# Patient Record
Sex: Female | Born: 1970 | Race: Black or African American | Hispanic: No
Health system: Southern US, Academic
[De-identification: ages and names within clinical notes are randomized; demographics above are authoritative.]

## PROBLEM LIST (undated history)

## (undated) ENCOUNTER — Encounter

## (undated) ENCOUNTER — Encounter: Attending: Registered" | Primary: Registered"

## (undated) ENCOUNTER — Encounter
Attending: Student in an Organized Health Care Education/Training Program | Primary: Student in an Organized Health Care Education/Training Program

## (undated) ENCOUNTER — Ambulatory Visit: Payer: MEDICARE

## (undated) ENCOUNTER — Ambulatory Visit

## (undated) ENCOUNTER — Telehealth

## (undated) ENCOUNTER — Telehealth
Attending: Pharmacist Clinician (PhC)/ Clinical Pharmacy Specialist | Primary: Pharmacist Clinician (PhC)/ Clinical Pharmacy Specialist

## (undated) ENCOUNTER — Ambulatory Visit: Payer: Medicare (Managed Care)

## (undated) ENCOUNTER — Encounter: Attending: Neurology | Primary: Neurology

## (undated) ENCOUNTER — Encounter
Payer: MEDICARE | Attending: Student in an Organized Health Care Education/Training Program | Primary: Student in an Organized Health Care Education/Training Program

## (undated) ENCOUNTER — Encounter: Attending: Physician Assistant | Primary: Physician Assistant

## (undated) ENCOUNTER — Encounter
Attending: Pharmacist Clinician (PhC)/ Clinical Pharmacy Specialist | Primary: Pharmacist Clinician (PhC)/ Clinical Pharmacy Specialist

## (undated) ENCOUNTER — Telehealth
Attending: Student in an Organized Health Care Education/Training Program | Primary: Student in an Organized Health Care Education/Training Program

## (undated) ENCOUNTER — Encounter: Payer: MEDICARE | Attending: Physician Assistant | Primary: Physician Assistant

## (undated) ENCOUNTER — Encounter: Attending: Internal Medicine | Primary: Internal Medicine

## (undated) ENCOUNTER — Non-Acute Institutional Stay: Payer: MEDICARE

## (undated) ENCOUNTER — Ambulatory Visit
Payer: MEDICARE | Attending: Student in an Organized Health Care Education/Training Program | Primary: Student in an Organized Health Care Education/Training Program

## (undated) ENCOUNTER — Other Ambulatory Visit: Attending: Pharmacist | Primary: Pharmacist

## (undated) ENCOUNTER — Ambulatory Visit: Attending: Family | Primary: Family

## (undated) ENCOUNTER — Telehealth: Attending: Physician Assistant | Primary: Physician Assistant

## (undated) ENCOUNTER — Ambulatory Visit
Attending: Pharmacist Clinician (PhC)/ Clinical Pharmacy Specialist | Primary: Pharmacist Clinician (PhC)/ Clinical Pharmacy Specialist

## (undated) ENCOUNTER — Encounter: Payer: MEDICARE | Attending: Registered" | Primary: Registered"

## (undated) ENCOUNTER — Ambulatory Visit: Payer: MEDICARE | Attending: Physician Assistant | Primary: Physician Assistant

## (undated) ENCOUNTER — Ambulatory Visit: Payer: MEDICARE | Attending: Registered" | Primary: Registered"

## (undated) ENCOUNTER — Encounter: Attending: Obesity Medicine | Primary: Obesity Medicine

## (undated) ENCOUNTER — Ambulatory Visit
Payer: MEDICAID | Attending: Student in an Organized Health Care Education/Training Program | Primary: Student in an Organized Health Care Education/Training Program

## (undated) ENCOUNTER — Encounter: Payer: MEDICARE | Attending: Neurology | Primary: Neurology

## (undated) ENCOUNTER — Ambulatory Visit: Attending: Pharmacist | Primary: Pharmacist

## (undated) ENCOUNTER — Encounter: Attending: Physical Medicine & Rehabilitation | Primary: Physical Medicine & Rehabilitation

## (undated) ENCOUNTER — Ambulatory Visit: Payer: MEDICAID

## (undated) ENCOUNTER — Encounter: Attending: Pharmacist | Primary: Pharmacist

## (undated) ENCOUNTER — Telehealth: Attending: Nephrology | Primary: Nephrology

## (undated) ENCOUNTER — Encounter: Attending: Nephrology | Primary: Nephrology

## (undated) ENCOUNTER — Ambulatory Visit: Payer: MEDICARE | Attending: MOHS-Micrographic Surgery | Primary: MOHS-Micrographic Surgery

## (undated) ENCOUNTER — Encounter: Attending: Nurse Practitioner | Primary: Nurse Practitioner

## (undated) ENCOUNTER — Telehealth: Attending: Neurology | Primary: Neurology

## (undated) ENCOUNTER — Inpatient Hospital Stay

## (undated) ENCOUNTER — Encounter: Payer: MEDICARE | Attending: Obesity Medicine | Primary: Obesity Medicine

## (undated) ENCOUNTER — Ambulatory Visit: Payer: MEDICARE | Attending: Nephrology | Primary: Nephrology

## (undated) ENCOUNTER — Telehealth: Attending: "Endocrinology | Primary: "Endocrinology

## (undated) ENCOUNTER — Ambulatory Visit: Attending: Neurology | Primary: Neurology

## (undated) ENCOUNTER — Ambulatory Visit
Payer: Medicare (Managed Care) | Attending: Student in an Organized Health Care Education/Training Program | Primary: Student in an Organized Health Care Education/Training Program

## (undated) MED ORDER — AUBAGIO 14 MG TABLET: Freq: Every evening | ORAL | 0 days

---
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
## 2011-11-01 DIAGNOSIS — G4733 Obstructive sleep apnea (adult) (pediatric): Secondary | ICD-10-CM | POA: Insufficient documentation

## 2012-08-13 ENCOUNTER — Emergency Department: Payer: Self-pay | Admitting: Internal Medicine

## 2012-08-13 LAB — COMPREHENSIVE METABOLIC PANEL
Alkaline Phosphatase: 91 U/L (ref 50–136)
Chloride: 104 mmol/L (ref 98–107)
Co2: 28 mmol/L (ref 21–32)
EGFR (African American): >60
EGFR (Non-African Amer.): >60
SGOT(AST): 22 U/L (ref 15–37)
SGPT (ALT): 19 U/L (ref 12–78)

## 2012-08-13 LAB — URINALYSIS, COMPLETE
Bilirubin,UR: NEGATIVE
Nitrite: NEGATIVE
Protein: NEGATIVE
RBC,UR: 3 /HPF (ref 0–5)
WBC UR: 10 /HPF (ref 0–5)

## 2012-08-13 LAB — CBC
HCT: 38 % (ref 35.0–47.0)
MCHC: 34.2 g/dL (ref 32.0–36.0)
Platelet: 231 10*3/uL (ref 150–440)
RBC: 4.49 10*6/uL (ref 3.80–5.20)
RDW: 13.7 % (ref 11.5–14.5)
WBC: 4.8 10*3/uL (ref 3.6–11.0)

## 2012-08-13 LAB — PROTIME-INR: Prothrombin Time: 13.3 secs (ref 11.5–14.7)

## 2013-01-21 DIAGNOSIS — G35 Multiple sclerosis: Secondary | ICD-10-CM | POA: Insufficient documentation

## 2013-04-11 DIAGNOSIS — J309 Allergic rhinitis, unspecified: Secondary | ICD-10-CM | POA: Insufficient documentation

## 2013-04-11 DIAGNOSIS — R29898 Other symptoms and signs involving the musculoskeletal system: Secondary | ICD-10-CM | POA: Insufficient documentation

## 2013-04-11 DIAGNOSIS — M19079 Primary osteoarthritis, unspecified ankle and foot: Secondary | ICD-10-CM | POA: Insufficient documentation

## 2013-05-23 DIAGNOSIS — G4700 Insomnia, unspecified: Secondary | ICD-10-CM | POA: Insufficient documentation

## 2013-05-23 DIAGNOSIS — M545 Low back pain, unspecified: Secondary | ICD-10-CM | POA: Insufficient documentation

## 2013-05-24 DIAGNOSIS — D509 Iron deficiency anemia, unspecified: Secondary | ICD-10-CM | POA: Insufficient documentation

## 2013-06-12 DIAGNOSIS — L709 Acne, unspecified: Secondary | ICD-10-CM | POA: Insufficient documentation

## 2013-10-20 IMAGING — US US EXTREM LOW VENOUS*R*
1 series · 14 of 20 positions shown · non-contrast
Comparison: none

REASON FOR EXAM: pain r leg
COMMENTS:

[Series 1: us extrem low venous*right* · 0.11mm/px · 14 of 20 slices shown]
[im 1/20]
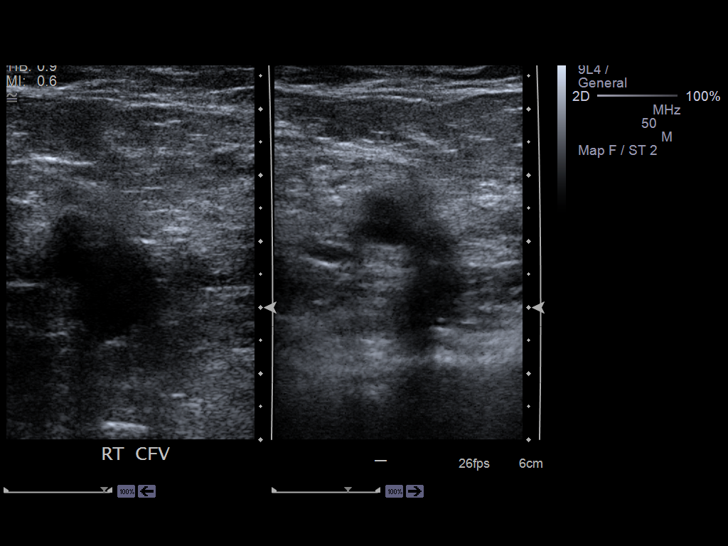
[im 3/20]
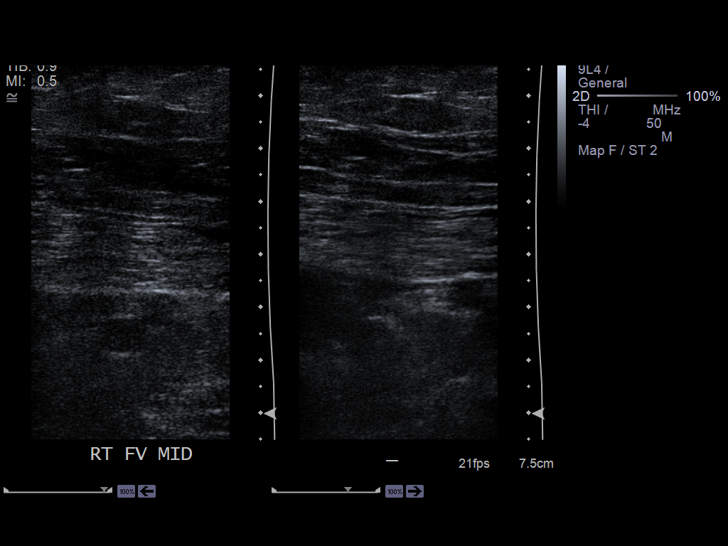
[im 4/20]
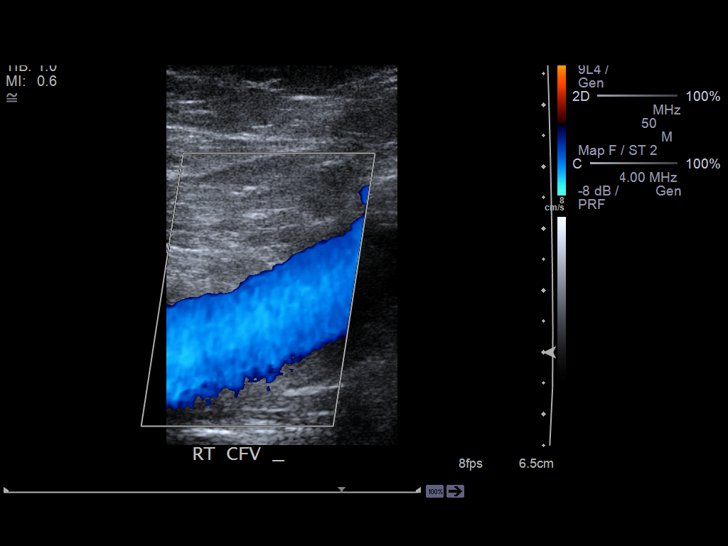
[im 6/20]
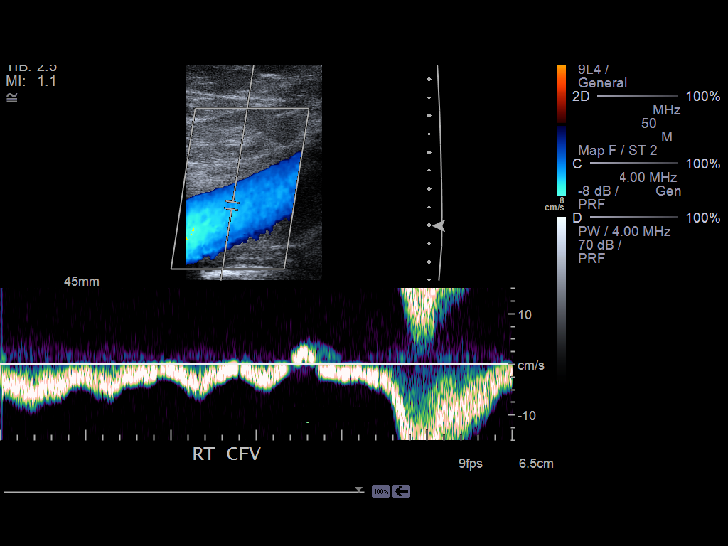
[im 7/20]
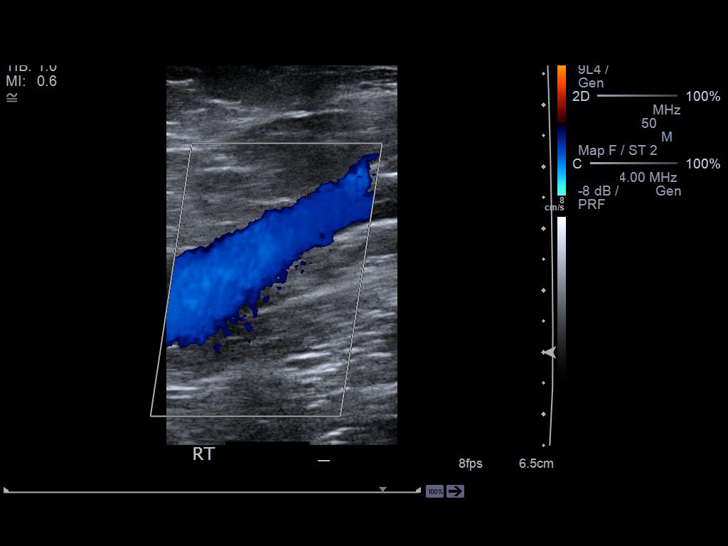
[im 8/20]
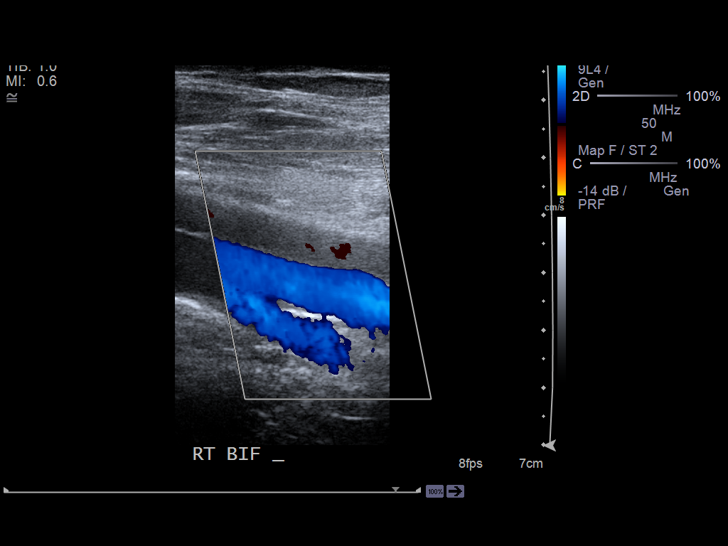
[im 10/20]
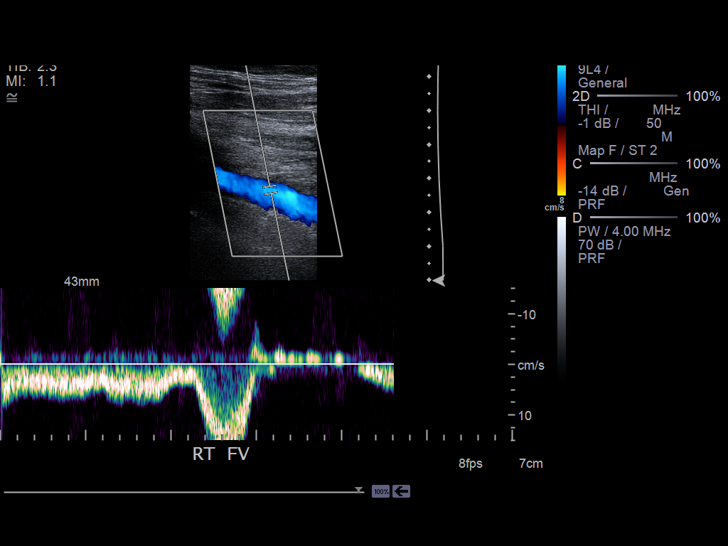
[im 11/20]
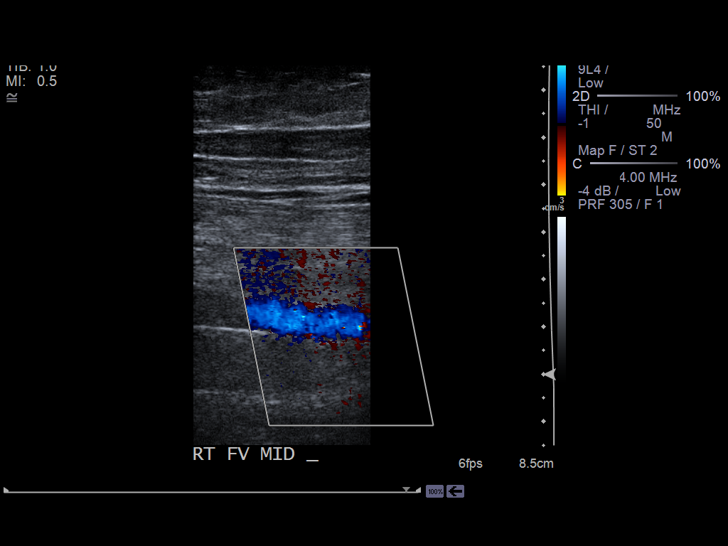
[im 13/20]
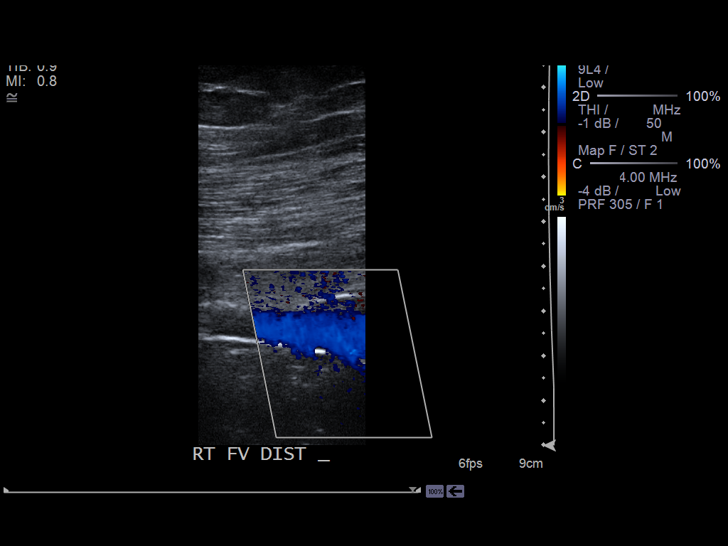
[im 14/20]
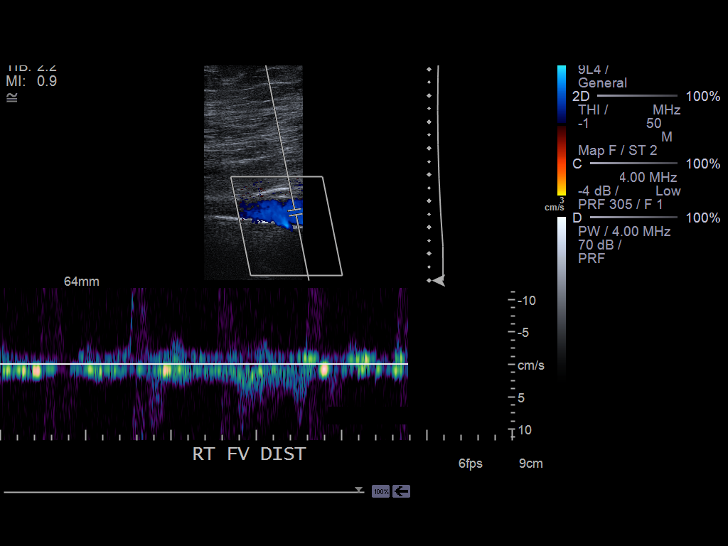
[im 16/20]
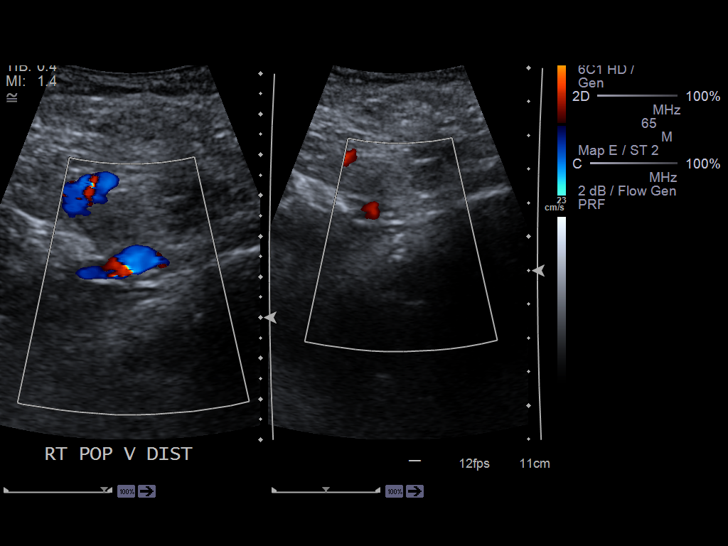
[im 17/20]
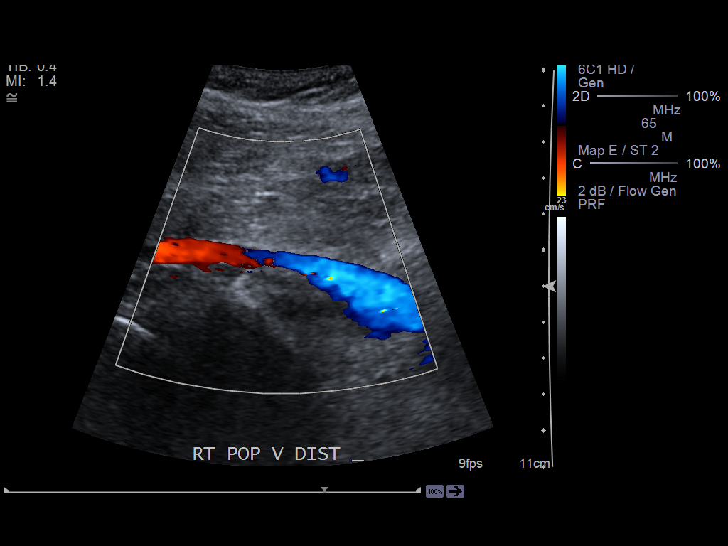
[im 18/20]
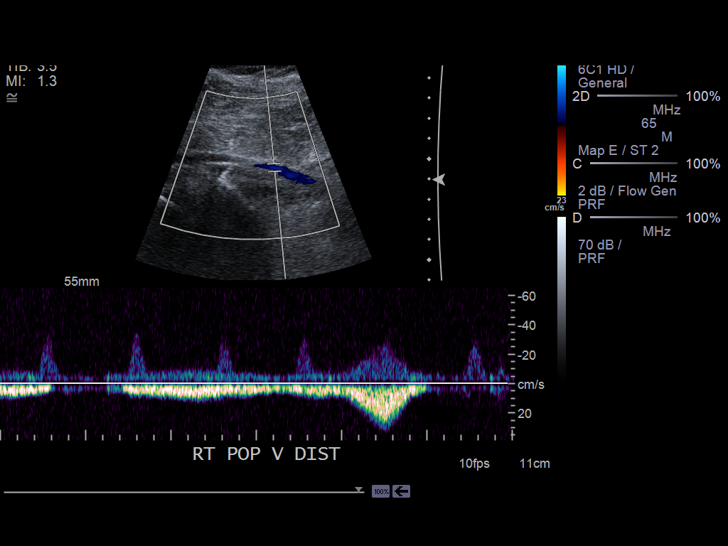
[im 20/20]
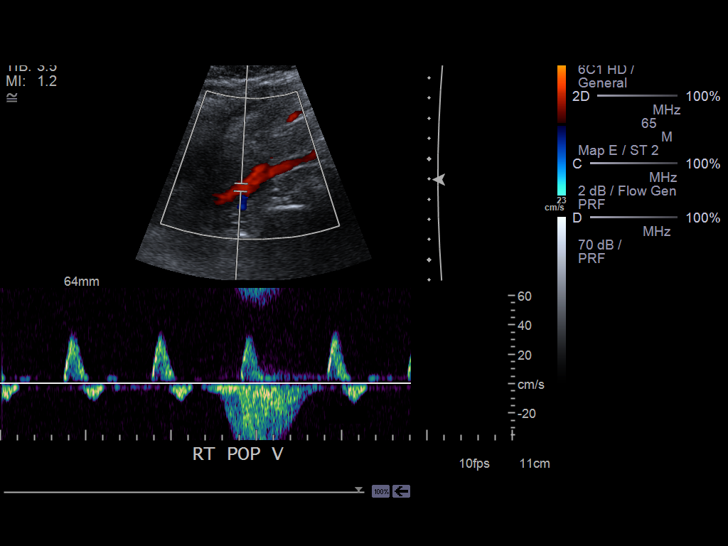

[14 of 20 positions shown; findings below may reference images not displayed]

PROCEDURE:     US  - US DOPPLER LOW EXTR RIGHT  - August 13, 2012 [DATE]

RESULT:     Technique: Gray scale, Duplex color flow and SPECTRAL waveform
imaging was performed of the deep venous structures of the right lower
extremity.

There is not evidence of increased echogenicity, non- compressibility,
abnormal waveform or abnormal grayscale flow with the interrogated deep
venous structures of the RIGHT lower extremity. There is appropriate
response to Valsalva and augmentation within the interrogated vessels.
IMPRESSION: 1. No sonographic evidence of a deep venous thrombus within the interrogated
vessels of the RIGHT lower extremity.

## 2013-10-20 IMAGING — CR RIGHT TIBIA AND FIBULA - 2 VIEW
1 series · 4 of 4 positions shown · non-contrast
Comparison: none

REASON FOR EXAM: pain swelling r leg
COMMENTS:

RESULT:     There is no evidence of fracture, dislocation, or malalignment.

[Series 1: ap · 0.17mm/px · 4 of 4 slices shown]
[im 1/4]
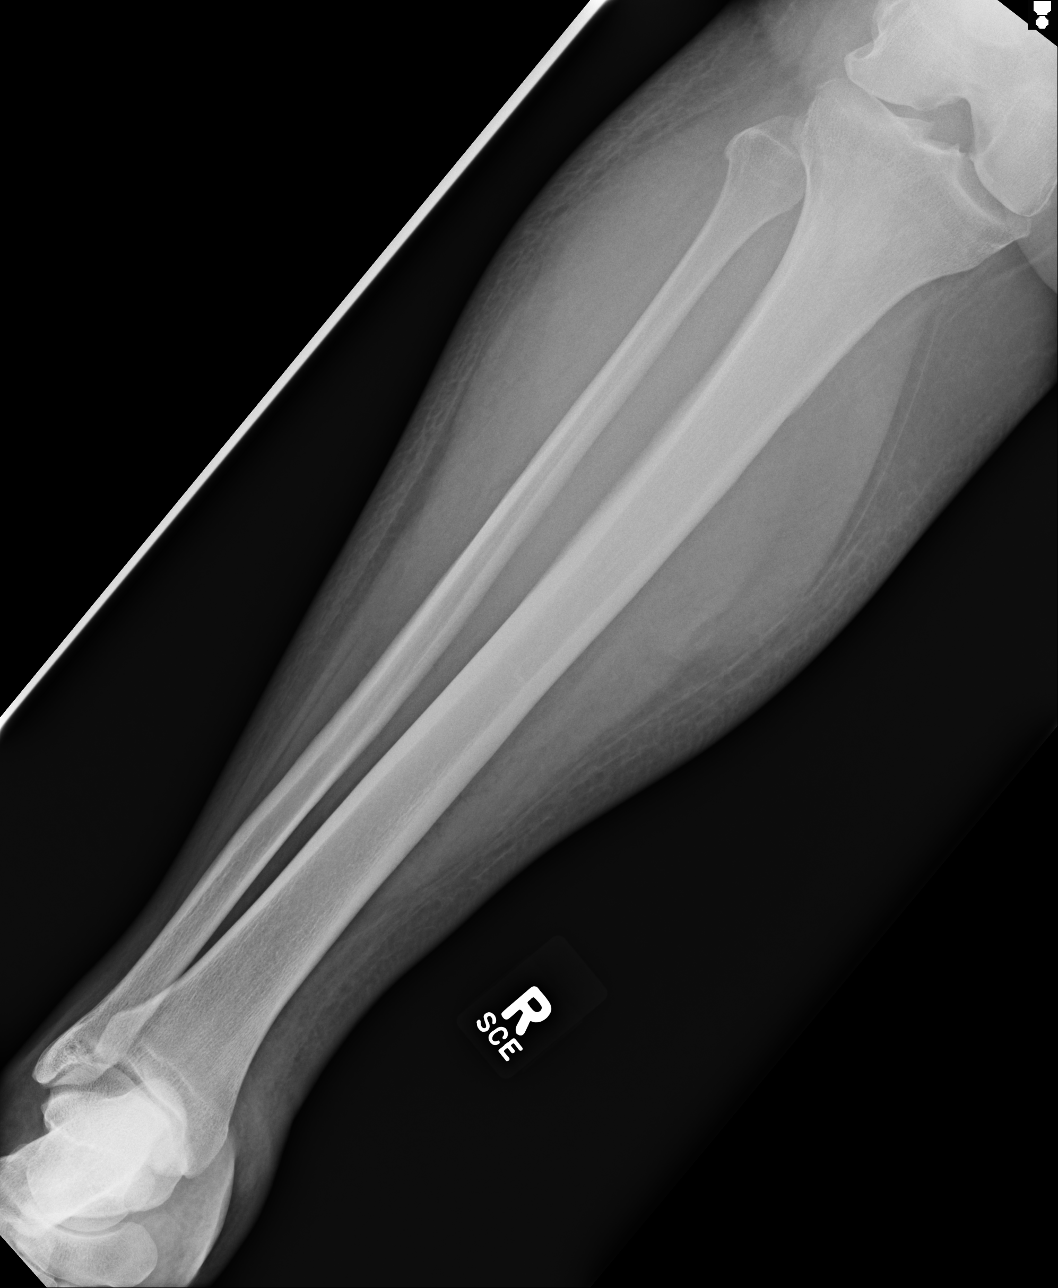
[im 2/4]
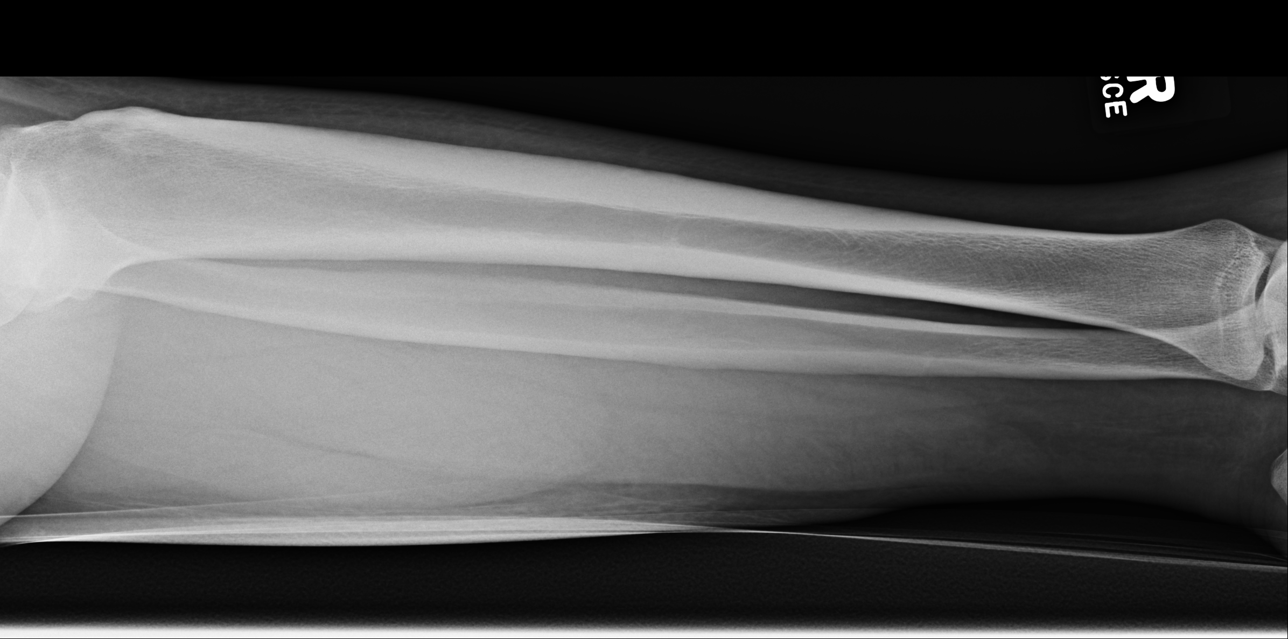
[im 3/4]
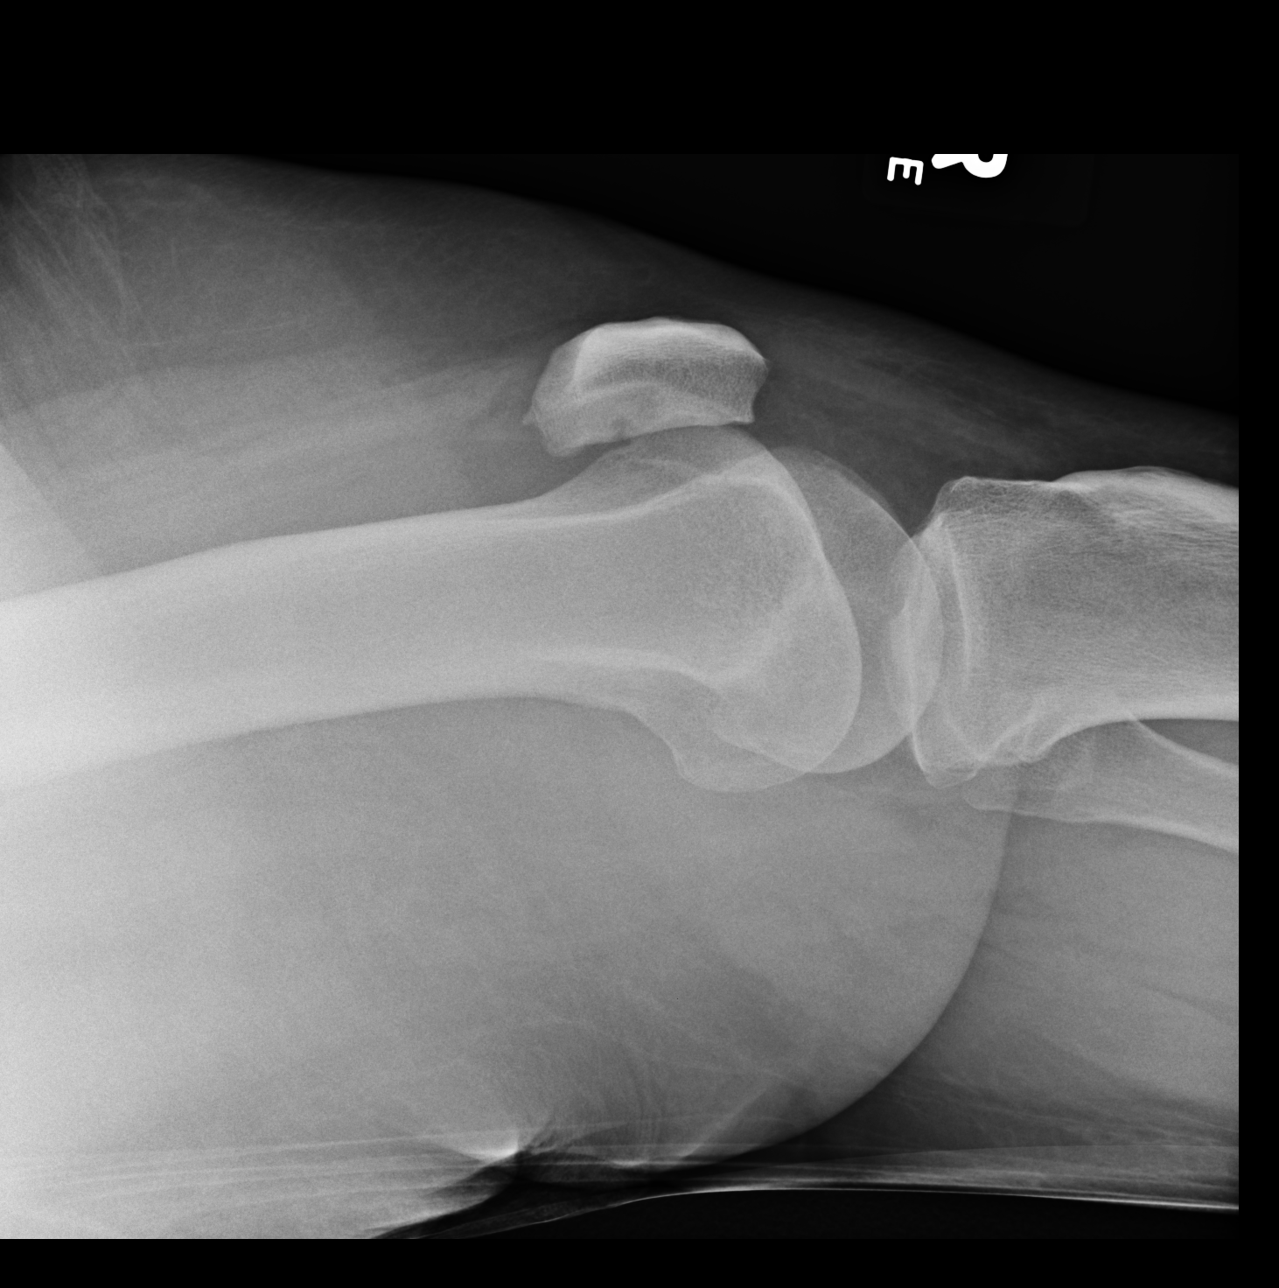
[im 4/4]
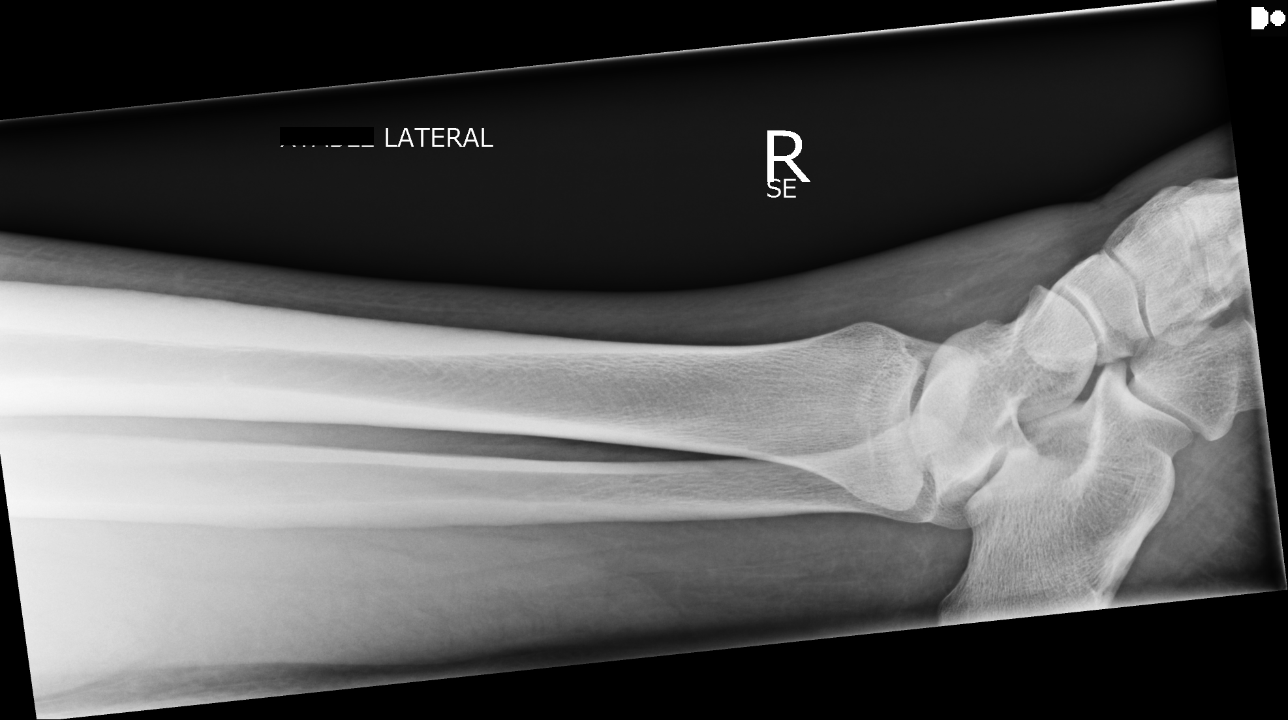

[4 of 4 positions shown; findings below may reference images not displayed]

IMPRESSION: 1. No evidence of acute abnormalities.
2. If there are persistent complaints of pain or persistent clinical
concern, a repeat evaluation in 7-10 days is recommended if clinically
warranted.

## 2013-12-10 DIAGNOSIS — Z9849 Cataract extraction status, unspecified eye: Secondary | ICD-10-CM | POA: Insufficient documentation

## 2013-12-10 DIAGNOSIS — Z961 Presence of intraocular lens: Secondary | ICD-10-CM | POA: Insufficient documentation

## 2015-08-25 DIAGNOSIS — E785 Hyperlipidemia, unspecified: Secondary | ICD-10-CM | POA: Insufficient documentation

## 2016-09-06 ENCOUNTER — Ambulatory Visit: Payer: Self-pay | Admitting: Podiatry

## 2016-09-16 ENCOUNTER — Ambulatory Visit: Payer: Self-pay | Admitting: Podiatry

## 2017-04-19 MED ORDER — TERIFLUNOMIDE 14 MG TABLET: 14 mg | tablet | Freq: Every day | 2 refills | 0 days | Status: AC

## 2017-04-19 MED ORDER — TERIFLUNOMIDE 14 MG TABLET
ORAL_TABLET | Freq: Every day | ORAL | 2 refills | 0.00000 days | Status: CP
Start: 2017-04-19 — End: 2017-07-11

## 2017-04-25 MED FILL — AUBAGIO/14MG/TABS: AUBAGIO/14MG/TABS | 28 days supply | Qty: 28 | Fill #0

## 2017-05-19 NOTE — Unmapped (Signed)
Moving clinical call

## 2017-05-19 NOTE — Unmapped (Signed)
[facility] Refill and Clinical Coordination Note  Medication(s): Victoria Kelly, DOB: 01/25/1971  Phone: [phone] (home) , Alternate phone contact: N/A  Shipping address: [address], [address]  [place] [address]  Phone or address changes today?: No  All above HIPAA information verified.  Insurance changes? No    Completed refill and clinical call assessment today to schedule patient's medication shipment from the [facility] [phone]).      MEDICATION RECONCILIATION    Confirmed the medication and dosage are correct and have not changed: Yes, regimen is correct and unchanged.    Were there any changes to your medication(s) in the past month:  No, there are no changes reported at this time.    ADHERENCE    Is this medicine transplant or covered by Medicare Part B? No.      Did you miss any doses in the past 4 weeks? No missed doses reported.  Adherence counseling provided? Not needed     SIDE EFFECT MANAGEMENT    Are you tolerating your medication?:  Victoria Kelly reports tolerating the medication.  Side effect management discussed: None      Therapy is appropriate and should be continued.    Evidence of clinical benefit: See Epic note from na/not seen since starting medication      FINANCIAL/SHIPPING    Delivery Scheduled: Yes, Expected medication delivery date: Tues, Aug 7   Additional medications refilled: No additional medications/refills needed at this time.    Victoria Kelly did not have any additional questions at this time.    Delivery address validated in [facility] scheduling system: Yes, address listed above is correct.      We will follow up with patient monthly for standard refill processing and delivery.      Thank you,  Tawanna Solo [facility] Specialty Pharmacist

## 2017-05-23 MED FILL — AUBAGIO/14MG/TABS: AUBAGIO/14MG/TABS | 28 days supply | Qty: 28 | Fill #1

## 2017-06-14 NOTE — Unmapped (Signed)
Specialty Pharmacy Refill Coordination Note     Victoria Kelly is a 46 y.o. female contacted today regarding refills of her specialty medication(s).    Reviewed and verified with patient:      Specialty medication(s) and dose(s) confirmed: yes  Changes to medications: no  Changes to insurance: no    Medication Adherence    Patient reported X missed doses in the last month:  0  Specialty Medication:  AUBAGIO   Patient is on additional specialty medications:  No  Informant:  patient  Confirmed plan for next specialty medication refill:  delivery by pharmacy  Medication Assistance Program  Refill Coordination  Has the Patient's Contact Information Changed:  No  Is the Shipping Address Different:  No  Shipping Information  Delivery Scheduled:  Yes  Delivery Date:  06/17/17  Medications to be Shipped:  AUBAGIO          Follow-up: 3 week(s)     Westley Gambles  Specialty Pharmacy Technician

## 2017-06-15 MED FILL — AUBAGIO/14MG/TABS: AUBAGIO/14MG/TABS | 28 days supply | Qty: 28 | Fill #2

## 2017-06-22 NOTE — Unmapped (Deleted)
Internal Medicine Clinic Visit    HPI: Victoria Kelly is a 46 year old female with a history of multiple sclerosis, gastric bypass, depression, GERD who presents for ***.       *MS: Followd by Dr. Johnnye Lana, started on Aubagio by Dr [name]     __________________________________________________________    Problem List:  Patient Active Problem List   Diagnosis   ??? Multiple sclerosis (CMS-HCC)   ??? Dysthymic disorder   ??? Obesity   ??? Obstructive sleep apnea   ??? Weakness of wrist   ??? Ankle arthritis   ??? Allergic rhinitis   ??? Insomnia   ??? Low back pain   ??? Iron deficiency anemia   ??? Acne   ??? H/O cataract removal with insertion of prosthetic lens   ??? Hyperlipidemia   ??? History of bariatric surgery       Medications:  Reviewed in EPIC  __________________________________________________________    Physical Exam:   Vital Signs:  There were no vitals filed for this visit.    Gen: Well appearing, NAD  CV: RRR, no murmurs  Pulm: CTA bilaterally, no crackles or wheezes  Abd: Soft, NTND, normal BS. No HSM.  Ext: No edema  ***  ____________________________________________________________    Assessment & Plan:    - CBC w/ Differential; Future  - Basic metabolic panel; Future  - Iron & TIBC; Future  - Ferritin; Future  - Copper, Serum; Future  - Zinc Level, Serum; Future  - Vitamin B1, Whole Blood; Future  - Folate Level; Future  - Hemoglobin A1c; Future  - Continue MVM and Calcium Carbonate    HCM  Cancer screening:   -Colon cancer:  -Breast cancer:  -Cervical cancer:  -Lung cancer (smoker):  Immunizations:  - Influenza  Labs:  - Lipids:  - HbA1c:  - HCV:  - HIV

## 2017-07-06 ENCOUNTER — Ambulatory Visit
Admission: RE | Admit: 2017-07-06 | Discharge: 2017-07-06 | Disposition: A | Discharge status: Home / Self Care | Payer: MEDICARE | Attending: Neurology | Admitting: Neurology

## 2017-07-06 DIAGNOSIS — G4700 Insomnia, unspecified: Secondary | ICD-10-CM

## 2017-07-06 DIAGNOSIS — R7989 Other specified abnormal findings of blood chemistry: Secondary | ICD-10-CM

## 2017-07-06 DIAGNOSIS — G35 Multiple sclerosis: Principal | ICD-10-CM

## 2017-07-06 LAB — COMPREHENSIVE METABOLIC PANEL
ALKALINE PHOSPHATASE: 81 U/L (ref 38–126)
ANION GAP: 8 mmol/L — ABNORMAL LOW (ref 9–15)
AST (SGOT): 18 U/L (ref 14–38)
BILIRUBIN TOTAL: 0.3 mg/dL (ref 0.0–1.2)
BLOOD UREA NITROGEN: 13 mg/dL (ref 7–21)
BUN / CREAT RATIO: 10
CALCIUM: 8.5 mg/dL (ref 8.5–10.2)
CHLORIDE: 107 mmol/L (ref 98–107)
CO2: 27 mmol/L (ref 22.0–30.0)
CREATININE: 1.3 mg/dL — ABNORMAL HIGH (ref 0.60–1.00)
EGFR MDRD AF AMER: 53 mL/min/{1.73_m2} — ABNORMAL LOW (ref >=60)
EGFR MDRD NON AF AMER: 44 mL/min/{1.73_m2} — ABNORMAL LOW (ref >=60)
GLUCOSE RANDOM: 93 mg/dL (ref 65–99)
POTASSIUM: 4.6 mmol/L (ref 3.5–5.0)
PROTEIN TOTAL: 5.9 g/dL — ABNORMAL LOW (ref 6.5–8.3)
SODIUM: 142 mmol/L (ref 135–145)

## 2017-07-06 LAB — CBC W/ AUTO DIFF
BASOPHILS ABSOLUTE COUNT: 0.1 10*9/L (ref 0.0–0.1)
EOSINOPHILS ABSOLUTE COUNT: 0.2 10*9/L (ref 0.0–0.4)
HEMOGLOBIN: 13.4 g/dL (ref 12.0–16.0)
LYMPHOCYTES ABSOLUTE COUNT: 1.6 10*9/L (ref 1.5–5.0)
MEAN CORPUSCULAR HEMOGLOBIN CONC: 32.9 g/dL (ref 31.0–37.0)
MEAN CORPUSCULAR HEMOGLOBIN: 30.2 pg (ref 26.0–34.0)
MEAN CORPUSCULAR VOLUME: 91.8 fL (ref 80.0–100.0)
MEAN PLATELET VOLUME: 8.2 fL (ref 7.0–10.0)
MONOCYTES ABSOLUTE COUNT: 0.3 10*9/L (ref 0.2–0.8)
PLATELET COUNT: 259 10*9/L (ref 150–440)
RED BLOOD CELL COUNT: 4.45 10*12/L (ref 4.00–5.20)
RED CELL DISTRIBUTION WIDTH: 14 % (ref 12.0–15.0)
WBC ADJUSTED: 5.8 10*9/L (ref 4.5–11.0)

## 2017-07-06 LAB — NEUTROPHILS ABSOLUTE COUNT: Lab: 3.4

## 2017-07-06 LAB — BILIRUBIN TOTAL: Bilirubin:MCnc:Pt:Ser/Plas:Qn:: 0.3

## 2017-07-06 MED ORDER — TRAZODONE 50 MG TABLET
ORAL_TABLET | Freq: Every evening | ORAL | 0 refills | 0 days | Status: CP
Start: 2017-07-06 — End: 2017-09-26

## 2017-07-06 NOTE — Unmapped (Addendum)
Internal Medicine Clinic Visit    HPI: [name] is a 46 year old female with a history of multiple sclerosis, gastric bypass, depression, GERD who presents for chest pain. She reports chest pain nearly daily, occurs with exertion (stairs/walking- esp outside), located in the center of her chest. The pain takes her breath away, resolves with rest. It is not associated with nausea, vomiting, SOB, diaphoresis, arm neck or jaw pain. The pain does not occur at rest. It does not occur if she stays in her house or walks in her house. She has not had chest pain before, believes this started in Feb of this year. She thought it was due to GERD, although it does not improve with ranitidine. She does not smoke, drink alcohol, or use illicit drugs. No family history of CAD- mother had AVR.   ??  ??  MS: Followd by Victoria Kelly, started on Aubagio14 mg pill once a day, been going OK, no flare.     No HTN at home, when she donates plasma her SBP is generally 100-120.     __________________________________________________________    Problem List:  Patient Active Problem List   Diagnosis   ??? Multiple sclerosis (CMS-HCC)   ??? Dysthymic disorder   ??? Obesity   ??? Obstructive sleep apnea   ??? Weakness of wrist   ??? Ankle arthritis   ??? Allergic rhinitis   ??? Insomnia   ??? Low back pain   ??? Iron deficiency anemia   ??? Acne   ??? H/O cataract removal with insertion of prosthetic lens   ??? Hyperlipidemia   ??? History of bariatric surgery       Medications:  Reviewed in EPIC  __________________________________________________________    Physical Exam:   Vital Signs:  Vitals:    07/08/17 1341 07/08/17 1410   BP: 144/70 122/78   Pulse: 62    Weight: (!) 128.4 kg (283 lb)    Height: 170.2 cm (5' 7)        Gen: Well appearing, NAD  CV: RRR, no murmurs  Pulm: CTA bilaterally, no crackles or wheezes  Back: Tenderness w/ palpation over left paraspinal muscle  Ext: No LE edema  Neuro: bilateral knee and hip strength 5/5, normal distal sensation. ____________________________________________________________    Assessment & Plan:    Chest pain: Sternal chest pain w/ exertion, lasting 5 minutes, resolves with rest, no other associated symptoms (diaphoresis, SOB, nausea, vomiting, arm pain, jaw pain). No hx of CAD although she is obese, no family history of CAD, she is a non smoker, no known diabetes, and age < 23 therefore overall low risk for CAD however her symptoms are very typical for angina therefore will proceed w/ stress test. She does have GERD that she reports is uncontrolled, would be odd for her GERD to only be with exertion.   - EKG in clinic w/ sinus brady, no TWI, no LVH, no Q waves  - Referral for exercise stress test  - Treatment of GERD as below.     Renal dysfunction: Cr elevated since 2018 (prev normal 2015), no hx of [name] although upon review has been elevated in the past to 1.1. No HTN or DM, no family hx of [name].  - D/C NSAIDs  - Renal ultrasound  - UA  - Urine microalb:Cr     GERD:   - Protonix 40 mg daily    Back pain: right sided low back pain without red flag symptoms.   - Tylenol, Salon pas  OTC, heating pad, exercises provided  - Discussed weight loss    S/p Gastric Bypass:   - Following labs completed 10/2016: CBC, BMP, Iron, TIBC, Ferritin, Copper, Zinc, Vit B1, Folate, A1c - recheck annually.   - Continue MVM, iron supplement, vitamin D    Insomnia: Trazodone 50 mg qhs, melatonin 5 mg qhs     Vitamin D Deficiency: Vit D (25OH) 8 in 10/2016; on Vit D 4000 IU/day, to be check by Neurology at next visit.   ??  HCM  Cancer screening:   -Colon cancer: not due  -Breast cancer: Mammogram 11/2016 negative  -Cervical cancer: s/p total hysterectomy   -Lung cancer (smoker): n/a  Labs:  - Lipids: HDL 198, LDL 135, HDL 50, TG 66 (08/2015)  - HbA1c: 4.9% (10/2016)  - HIV: Nonreactive 2013

## 2017-07-06 NOTE — Unmapped (Signed)
Please schedule with me for the end of December 2018.     ?   In case of:  ? a suspected relapse (new symptoms or worsening existing symptoms, lasting for >24h)  OR  ? a need for an additional appointment for other reasons     Please contact:    [facility] Neurology [facility] Desk  Phone: [phone]      OR   Ms. Webb Laws  Administrative [facility], Department of Neurology  [id number]  [place], [place] [address]     Phone: [phone], Fax: [phone]      Sarita Bottom, MD  Clinical Associate Professor of Neurology  [facility], Department of Neurology  Multiple Sclerosis/Neuroimmunology Division  [address] [place], [place], [place] [address]

## 2017-07-06 NOTE — Unmapped (Signed)
[facility] [place] [facility] at [facility]  Multiple Sclerosis/Neuroimmunology Division  Victoria Bottom, MD  Associate Professor of Neurology    DATE OF VISIT: 07/06/17    Re:  Victoria Kelly  [address]  [place] [address]  MRN: [id number]  DOB: [birth year]      Direct entry by: Dr. Sarita Kelly    Visit: Follow-up Visit      REASON FOR VISIT: Ms. MICHAELLE Kelly, a 46 y.o. African American right handed female, is seen in consultation at the [facility], Multiple Sclerosis/Neuroimmunology Division for the follow-up of MS.  [name] was last time seen at the [facility] by on 03/28/2017      Assessment:     1. I took a detailed history of the present illness fromMs. Suad L Kelly , details on past medical history, family history and social history.  2. I personally reviewed  patient's prior medical records, radiology reports, and laboratory work.    3. I personally reviewed the patient???s prior MRI images and have discussed the MRI finding with the patient.   4.  I performed neurological examination and completed the [place] Expanded Disability Status (EDSS) scale  5.  Victoria Kelly agreed with the recommended diagnostic and treatment plan.                                                                                                                                                     ?? Multiple sclerosis:  Disease evolution is since 2003.   -MS mimickers negative, ANA 1:80 is non-significant  -Cntinue with Aubagio  -Follow-up with me in 3 months, or earlier, if needed.     ?? Vitamin D deficiency:  Educated to continue with  Vitamin D 4000 IU/day.     ?? Elevated serum creatinine:  Was present before the start of Aubagio. Follow-up with PCP. Per FDA label, no Aubagio dosage adjustment is necessary for patients with mild, moderate, and severe renal impairment ([email or web address]).     Plan:     1.CBC/diff, CMP, creatinine, LFTs as a standing order -external once a months in one and in 2 months, results please to be sent to [facility].   2. Continue Aubagio  3. Vitamin D 4000 IU/day   4. Follow-up with me in 3 months, or earlier, if needed.     Orders placed at today's visit:    ?? Diagnostic work-up:  ?? CBC/diff, CMP, creatinine, LFTs as a standing order -external once a months in one and in 2 months, results please to be sent to [facility].     ?? Change in medication suggested today:   ?? No change      Subjective:     CHIEF COMPLAINTS:  Bilateral color vision  defect and blurry vision.     HISTORY OF PRESENT ILLNESS:   Victoria Kelly has been diagnosed with MS in 2003.  In 2003 she had left sided ON. Right sided ON occurred after a month. She now has color vision decrease. She also has a cataract, and did the surgery. In the following years she had an episode when she could not feel cold/warm on one side of her body. She got better but not completely. She further had an episode of right sided weakness and received steroids and recovered. 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 She was initially treated with Copaxone; however, discontinued in 2011 (per notes by Dr. Hunt Oris).     Interval history: Started with Aubagio at the end of June 2018. Denies pregnancy. She stopped taking vitamin D.     Please see below the results of the diagnostic work-up performed so far.   ............................................................................................................................................Marland Kitchen  DIAGNOSTIC STUDIES / REVIEW OF RECORDS:    Prior medical records: [name]    MRI:  10/26/2016: Brain MRI with and w/o contrast,report (COMPARISON: Brain MR 11/30/2014 and prior): Multiple white matter lesions in a distribution consistent with multiple sclerosis. No new or enhancing lesions. 10/26/2016: C-spine  MRI with and w/o contrast,report (COMPARISON: MRI cervical spine 12/21/2011): Stable T2 hyperintense lesion in the cord at C3. No new lesions.    Lumbar puncture:   Per notes by Dr. Alessandra Bevels on 04/18/2007 'she had a negative lumbar puncture' but I have no results available for my review.     Blood tests:    Office Visit on 07/06/2017   Component Date Value Ref Range Status   ??? Sodium 07/06/2017 142  135 - 145 mmol/L Final   ??? Potassium 07/06/2017 4.6  3.5 - 5.0 mmol/L Final   ??? Chloride 07/06/2017 107  98 - 107 mmol/L Final   ??? CO2 07/06/2017 27.0  22.0 - 30.0 mmol/L Final   ??? BUN 07/06/2017 13  7 - 21 mg/dL Final   ??? Creatinine 07/06/2017 1.30* 0.60 - 1.00 mg/dL Final   ??? BUN/Creatinine Ratio 07/06/2017 10   Final   ??? EGFR MDRD Non Af Amer 07/06/2017 44* >=60 mL/min/1.26m2 Final   ??? EGFR MDRD Af Amer 07/06/2017 53* >=60 mL/min/1.8m2 Final   ??? Anion Gap 07/06/2017 8* 9 - 15 mmol/L Final   ??? Glucose 07/06/2017 93  65 - 99 mg/dL Final   ??? Calcium 13/05/6577 8.5  8.5 - 10.2 mg/dL Final   ??? Albumin 46/96/2952 3.1* 3.5 - 5.0 g/dL Final   ??? Total Protein 07/06/2017 5.9* 6.5 - 8.3 g/dL Final   ??? Total Bilirubin 07/06/2017 0.3  0.0 - 1.2 mg/dL Final   ??? AST 84/13/2440 18  14 - 38 U/L Final   ??? ALT 07/06/2017 28  15 - 48 U/L Final   ??? Alkaline Phosphatase 07/06/2017 81  38 - 126 U/L Final   ??? WBC 07/06/2017 5.8  4.5 - 11.0 10*9/L Final   ??? RBC 07/06/2017 4.45  4.00 - 5.20 10*12/L Final   ??? HGB 07/06/2017 13.4  12.0 - 16.0 g/dL Final   ??? HCT 08/14/2535 40.8  36.0 - 46.0 % Final   ??? MCV 07/06/2017 91.8  80.0 - 100.0 fL Final   ??? MCH 07/06/2017 30.2  26.0 - 34.0 pg Final   ??? MCHC 07/06/2017 32.9  31.0 - 37.0 g/dL Final   ??? RDW 64/40/3474 14.0  12.0 - 15.0 % Final   ???  MPV 07/06/2017 8.2  7.0 - 10.0 fL Final   ??? Platelet 07/06/2017 259  150 - 440 10*9/L Final   ??? Absolute Neutrophils 07/06/2017 3.4  2.0 - 7.5 10*9/L Final   ??? Absolute Lymphocytes 07/06/2017 1.6  1.5 - 5.0 10*9/L Final   ??? Absolute Monocytes 07/06/2017 0.3  0.2 - 0.8 10*9/L Final   ??? Absolute Eosinophils 07/06/2017 0.2  0.0 - 0.4 10*9/L Final   ??? Absolute Basophils 07/06/2017 0.1  0.0 - 0.1 10*9/L Final   ??? Large Unstained Cells 07/06/2017 4  0 - 4 % Final   ??? Hypochromasia 07/06/2017 Slight* Not Present Final   Office Visit on 03/28/2017   Component Date Value Ref Range Status   ??? Sodium 03/28/2017 141  135 - 145 mmol/L Final   ??? Potassium 03/28/2017 4.2  3.5 - 5.0 mmol/L Final   ??? Chloride 03/28/2017 100  98 - 107 mmol/L Final   ??? CO2 03/28/2017 26.0  22.0 - 30.0 mmol/L Final   ??? BUN 03/28/2017 14  7 - 21 mg/dL Final   ??? Creatinine 03/28/2017 1.25* 0.60 - 1.00 mg/dL Final   ??? BUN/Creatinine Ratio 03/28/2017 11   Final   ??? EGFR MDRD Non Af Amer 03/28/2017 46* >=60 mL/min/1.67m2 Final   ??? EGFR MDRD Af Amer 03/28/2017 56* >=60 mL/min/1.77m2 Final   ??? Anion Gap 03/28/2017 15  9 - 15 mmol/L Final   ??? Glucose 03/28/2017 84  65 - 179 mg/dL Final   ??? Calcium 14/78/2956 8.6  8.5 - 10.2 mg/dL Final   ??? Albumin 21/30/8657 3.2* 3.5 - 5.0 g/dL Final   ??? Total Protein 03/28/2017 6.1* 6.5 - 8.3 g/dL Final   ??? Total Bilirubin 03/28/2017 0.2  0.0 - 1.2 mg/dL Final   ??? AST 84/69/6295 14  14 - 38 U/L Final   ??? ALT 03/28/2017 20  15 - 48 U/L Final   ??? Alkaline Phosphatase 03/28/2017 67  38 - 126 U/L Final   ??? NMO AQP4 IgG, Serum 03/28/2017 Negative  Negative Final   ??? MOG-IgG 03/28/2017 negative  Negative Final   ??? Antinuclear Antibodies (ANA) 03/28/2017 Positive* Negative Final   ??? Pattern 1 (ANA) 03/28/2017 Speckled   Final   ??? ANA Titer 1 03/28/2017 1:80   Final   ??? ENA Screen 03/28/2017 0.10  <0.70 ENA Units Final   ??? Rheumatoid Factor 03/28/2017 <8.6  0.0 - 15.0 IU/mL Final   ??? Angio Convert Enzyme 03/28/2017 39  8 - 53 U/L Final   ??? Quanterferon TB Gold 03/28/2017 Negative  Negative Final   ??? Quanterferon NIL Value 03/28/2017 0.12  IU/mL Final   ??? Quantiferon Mitogen Minus Nil 03/28/2017 >10.00  IU/mL Final   ??? Quantiferon Antigen Minus Nil 03/28/2017 -0.04  IU/mL Final   ??? WBC 03/28/2017 5.7  4.5 - 11.0 10*9/L Final   ??? RBC 03/28/2017 4.59  4.00 - 5.20 10*12/L Final   ??? HGB 03/28/2017 13.3  12.0 - 16.0 g/dL Final   ??? HCT 28/41/3244 41.6  36.0 - 46.0 % Final   ??? MCV 03/28/2017 90.6  80.0 - 100.0 fL Final   ??? MCH 03/28/2017 29.1  26.0 - 34.0 pg Final   ??? MCHC 03/28/2017 32.1  31.0 - 37.0 g/dL Final   ??? RDW 10/20/7251 13.2  12.0 - 15.0 % Final   ??? MPV 03/28/2017 7.9  7.0 - 10.0 fL Final   ??? Platelet 03/28/2017 264  150 - 440 10*9/L Final   ???  Absolute Neutrophils 03/28/2017 2.9  2.0 - 7.5 10*9/L Final   ??? Absolute Lymphocytes 03/28/2017 2.1  1.5 - 5.0 10*9/L Final   ??? Absolute Monocytes 03/28/2017 0.2  0.2 - 0.8 10*9/L Final   ??? Absolute Eosinophils 03/28/2017 0.2  0.0 - 0.4 10*9/L Final   ??? Absolute Basophils 03/28/2017 0.1  0.0 - 0.1 10*9/L Final   ??? Large Unstained Cells 03/28/2017 3  0 - 4 % Final   ??? Hypochromasia 03/28/2017 Slight* Not Present Final   Lab on 10/29/2016   Component Date Value Ref Range Status   ??? Vitamin D Total (25OH) 10/29/2016 8.0* 20.0 - 80.0 ng/mL Final   ??? Sodium 10/29/2016 140  135 - 145 mmol/L Final   ??? Potassium 10/29/2016 3.9  3.5 - 5.0 mmol/L Final   ??? Chloride 10/29/2016 103  98 - 107 mmol/L Final   ??? CO2 10/29/2016 29.0  22.0 - 30.0 mmol/L Final   ??? BUN 10/29/2016 10  7 - 21 mg/dL Final   ??? Creatinine 10/29/2016 1.13* 0.60 - 1.00 mg/dL Final   ??? BUN/Creatinine Ratio 10/29/2016 9   Final   ??? EGFR MDRD Non Af Amer 10/29/2016 52* >=60 mL/min/1.77m2 Final   ??? EGFR MDRD Af Amer 10/29/2016 >=60  >=60 mL/min/1.44m2 Final   ??? Anion Gap 10/29/2016 8* 9 - 15 mmol/L Final   ??? Glucose 10/29/2016 72  65 - 179 mg/dL Final   ??? Calcium 56/21/3086 8.6  8.5 - 10.2 mg/dL Final   ??? Iron 57/84/6962 24* 35 - 165 ug/dL Final   ??? TIBC 95/28/4132 279.2  252.0 - 479.0 mg/dL Final   ??? Transferrin 10/29/2016 221.6  200.0 - 380.0 mg/dL Final   ??? Iron Saturation (%) 10/29/2016 9* 15 - 50 % Final   ??? Ferritin 10/29/2016 60.5  3.0 - 151.0 ng/mL Final   ??? Copper 10/29/2016 1.23  0.75 - 1.45 mcg/mL Final   ??? Zinc 10/29/2016 0.44* 0.66 - 1.10 mcg/mL Final   ??? VITAMIN B1 10/29/2016 148  70 - 180 nmol/L Final   ??? Folate 10/29/2016 4.9  2.7 - 20.0 ng/mL Final   ??? Hemoglobin A1C 10/29/2016 4.9  4.8 - 6.0 % Final   ??? Estimated Average Glucose 10/29/2016 94  mg/dL Final   ??? WBC 44/10/270 4.7  4.5 - 11.0 10*9/L Final   ??? RBC 10/29/2016 4.67  4.00 - 5.20 10*12/L Final   ??? HGB 10/29/2016 13.7  12.0 - 16.0 g/dL Final   ??? HCT 53/66/4403 42.2  36.0 - 46.0 % Final   ??? MCV 10/29/2016 90.4  80.0 - 100.0 fL Final   ??? MCH 10/29/2016 29.3  26.0 - 34.0 pg Final   ??? MCHC 10/29/2016 32.4  31.0 - 37.0 g/dL Final   ??? RDW 47/42/5956 13.8  12.0 - 15.0 % Final   ??? MPV 10/29/2016 8.0  7.0 - 10.0 fL Final   ??? Platelet 10/29/2016 236  150 - 440 10*9/L Final   ??? Absolute Neutrophils 10/29/2016 3.4  2.0 - 7.5 10*9/L Final   ??? Absolute Lymphocytes 10/29/2016 0.7* 1.5 - 5.0 10*9/L Final   ??? Absolute Monocytes 10/29/2016 0.4  0.2 - 0.8 10*9/L Final   ??? Absolute Eosinophils 10/29/2016 0.1  0.0 - 0.4 10*9/L Final   ??? Absolute Basophils 10/29/2016 0.0  0.0 - 0.1 10*9/L Final   ??? Large Unstained Cells 10/29/2016 3  0 - 4 % Final   ??? Hypochromasia 10/29/2016 Slight* Not Present Final   Office Visit on 09/27/2016  Component Date Value Ref Range Status   ??? VIT D2 (25OH) 09/27/2016 <5  ng/mL Final   ??? VIT D3 (25OH) 09/27/2016 <5  ng/mL Final   ??? Vitamin D Total (25OH) 09/27/2016 <5* 20 - 80 ng/mL Final   ??? Albumin 09/27/2016 3.4* 3.5 - 5.0 g/dL Final   ??? Total Protein 09/27/2016 6.9  6.6 - 8.0 g/dL Final   ??? Total Bilirubin 09/27/2016 0.6  0.0 - 1.2 mg/dL Final   ??? Bilirubin, Direct 09/27/2016 0.40  0.00 - 0.40 mg/dL Final   ??? AST 16/07/9603 15  14 - 38 U/L Final   ??? ALT 09/27/2016 26  15 - 48 U/L Final   ??? Alkaline Phosphatase 09/27/2016 75  38 - 126 U/L Final   ??? WBC 09/27/2016 5.3  4.5 - 11.0 10*9/L Final   ??? RBC 09/27/2016 4.17  4.00 - 5.20 10*12/L Final   ??? HGB 09/27/2016 12.6  12.0 - 16.0 g/dL Final   ??? HCT 54/06/8118 37.0  36.0 - 46.0 % Final   ??? MCV 09/27/2016 88.7  80.0 - 100.0 fL Final   ??? MCH 09/27/2016 30.2  26.0 - 34.0 pg Final   ??? MCHC 09/27/2016 34.1  31.0 - 37.0 g/dL Final   ??? RDW 14/78/2956 13.6  12.0 - 15.0 % Final   ??? MPV 09/27/2016 7.7  7.0 - 10.0 fL Final   ??? Platelet 09/27/2016 268  150 - 440 10*9/L Final   ??? Absolute Neutrophils 09/27/2016 3.0  2.0 - 7.5 10*9/L Final   ??? Absolute Lymphocytes 09/27/2016 1.6  1.5 - 5.0 10*9/L Final   ??? Absolute Monocytes 09/27/2016 0.3  0.2 - 0.8 10*9/L Final   ??? Absolute Eosinophils 09/27/2016 0.2  0.0 - 0.4 10*9/L Final   ??? Absolute Basophils 09/27/2016 0.0  0.0 - 0.1 10*9/L Final   ??? Large Unstained Cells 09/27/2016 3  0 - 4 % Final   ??? Hypochromasia 09/27/2016 Slight* Not Present Final     Other:   .............................................................................................................................................    Past Medical History:  Past Medical History:   Diagnosis Date   ??? Allergic rhinitis, cause unspecified 04/11/2013   ??? Dysthymic disorder 02/10/2012   ??? Generalized anxiety disorder 02/10/2012   ??? H/O bariatric surgery    ??? High blood cholesterol level 05/31/2013   ??? Hx of trichomonal vaginitis 02/2006    On Pap smear   ??? Insomnia 05/23/2013    Getting ambien through psych     ??? Iron deficiency anemia 05/24/2013   ??? Morbid obesity with BMI of 50.0-59.9, adult (CMS-HCC) 11/26/2011   ??? MS (multiple sclerosis) (CMS-HCC) 11/23/2001   ??? Obstructive sleep apnea 11/01/2011    uses cpap        ALLERGIES:    Allergies   Allergen Reactions   ??? Cymbalta [Duloxetine] Other (See Comments)     Metal taste in mouth     ??? Keflex [Cephalexin] Other (See Comments)     rash     Current Outpatient Prescriptions   Medication Sig Dispense Refill   ??? acetaminophen (TYLENOL) 500 MG tablet Take 2 tablets (1,000 mg total) by mouth every eight (8) hours as needed for pain. 30 tablet 0   ??? cholecalciferol, vitamin D3, 4,000 unit Tab Take 4,000 Units by mouth daily. (Patient not taking: Reported on 07/06/2017) 30 tablet 11   ??? ferrous sulfate 325 (65 FE) MG tablet Take 1 tablet (325 mg total) by mouth daily with breakfast. 90 tablet 3   ???  fluticasone (FLONASE) 50 mcg/actuation nasal spray 2 sprays by Each Nare route daily. 16 g 6   ??? multivitamin (TAB-A-VITE/THERAGRAN) per tablet Take 1 tablet by mouth daily. 90 tablet 3   ??? pantoprazole (PROTONIX) 40 MG tablet Take 1 tablet (40 mg total) by mouth daily. 90 tablet 3   ??? PARoxetine (PAXIL) 30 MG tablet Take 2 tablets (60 mg total) by mouth every morning. 90 tablet 3   ??? ranitidine (ZANTAC) 75 MG tablet Take 1 tablet (75 mg total) by mouth daily. 90 tablet 3   ??? teriflunomide (AUBAGIO) 14 mg Tab Take 14 mg by mouth daily. 30 tablet 2   ??? traZODone (DESYREL) 50 MG tablet Take 1 tablet (50 mg total) by mouth nightly. 30 tablet 0     No current facility-administered medications for this visit.          Past Surgical History:    Past Surgical History:   Procedure Laterality Date   ??? ABDOMINAL HYSTERECTOMY  05/05/2006    Ovaries were kept inside   ??? BARIATRIC SURGERY  2014   ??? CATARACT EXTRACTION Bilateral 08/14/2003, 09/09/2003   ??? CESAREAN SECTION  01/12/2000   ??? DEBRIDEMENT LEG Left 09/30/1999    Spider bite   ??? ENDOMETRIAL ABLATION  02/2005   ??? ENDOMETRIAL ABLATION  02/11/2005   ??? HYSTERECTOMY     ??? OOPHORECTOMY     ??? PR LAP, GAST RESTRICT PROC, LONGITUDINAL GASTRECTOMY Midline 02/15/2014    Procedure: ROBOTIC PR LAP, GAST RESTRICT PROC, LONGITUDINAL GASTRECTOMY;  Surgeon: Aida Raider, MD;  Location: MAIN OR [place];  Service: Gastrointestinal   ??? TONSILLECTOMY  1982   ??? TUBAL LIGATION  01/12/2000       Social History:    Social History     Social History   ??? Marital status: Divorced     Spouse name: N/A   ??? Number of children: 3   ??? Years of education: N/A     Occupational History   ??? Disabiled Unknown     Social History Main Topics   ??? Smoking status: Never Smoker   ??? Smokeless tobacco: Never Used   ??? Alcohol use No   ??? Drug use: No   ??? Sexual activity: Not Currently     Other Topics Concern   ??? Do You Use Sunscreen? No   ??? Tanning Bed Use? No   ??? Are You Easily Burned? No   ??? Excessive Sun Exposure? No     Social History Narrative    Single, has 3 children, aged 12-25. Married in 1997, divorced in 2001. Lives with her boyfriend. On disability (r/t Multiple Sclerosis), previously employed as a Interior and spatial designer, bus Hospital doctor. Born and raised in [place]. Attended [employer] in 1993.    Attended [employer] and [employer], some college credits attained.           Family History:    Family History   Problem Relation Age of Onset   ??? Breast cancer Cousin    ??? Cervical cancer Cousin    ??? Brain cancer Father         Brain tumor   ??? Hyperlipidemia Father    ??? Lupus Mother    ??? Hypertension Mother    ??? Fibromyalgia Mother    ??? Heart disease Mother         aortic valve replacement   ??? Cataracts Mother    ??? Leukemia Maternal Aunt    ??? Lupus Maternal  Aunt    ??? Lupus Maternal Aunt         Review of Systems:  A 10-systems review was performed and, unless otherwise noted, declared negative by patient.    Objective:     Physical Exam:  Blood pressure 119/63, pulse 61, height 170.2 cm (5' 7), weight (!) 129 kg (284 lb 8 oz), not currently breastfeeding.   General Appearance: in no acute distress. Normal skin color, afebrile.  Heart and lungs: Regular heart rate. Eupneic, normal respiratory rate. Abdomen: Soft, non-tender. No peripheral  edema, peripheral pulses palpable.       NEUROLOGICAL EXAMINATION:     General:  Alert and oriented to person, place, time and situation.    Recent and remote memory intact.    Attention span and concentration normal.    Language and spontaneous speech normal, no dysarthria or aphasia.  Naming/fluency/repetition intact.  Fund of knowledge normal.  Following lateralizing commands across midline.      Cranial Nerves:     II, III- Pupils are equal and reactive to light b/l (direct and consensual reactions). Visual Acuity: 20/100 b/l. Decrease in color vision b/l.  No visual field defect.   III, IV, VI- extra ocular movements are intact, No ptosis, no diplopia, no nystagmus.  V- sensation of the face intact b/l.   VII- face symmetrical, no facial droop, normal facial movements with smile/grimace  VIII- Hearing grossly intact. Weber test: sound is symmetrical with no lateralization.  IX and X- symmetric palate contraction, normal gag bilaterally, no dysarthria, no dysphagia.  XI- Full shoulder shrug bilaterally; no wasting, normal tone and strength of sternocleidomastoid muscles bilaterally.  XII- No tongue atrophy, no tongue fasciculations; tongue protrudes midline, full range of movements of the tongue.    Neck flexion normal.    Motor Exam:     Normal bulk. Fasciculations not observed.     Muscle strength:    Muscles UEs  LEs    R L  R L   Deltoids 5/5 5/5 Hip flexors  5/5 5/5   Biceps 5/5 5/5 Hip extensors 5/5 5/5   Triceps 5/5 5/5 Knee flexors 5/5 5/5   Hand grip 5/5 5/5 Knee extensors 5/5 5/5   Wrist flexors 5/5 5/5 Foot dorsal flexors 5/5 5/5   Wrist extensors 5/5 5/5 Foot plantar flexors 5/5 5/5   Finger flexors 5/5 5/5 Toes dorsal flexors 5/5 5/5   Finger extensors 5/5 5/5 Toes plantar flexors 5/5 5/5          Reflexes R L   Biceps +2 +2   Brachioradialis  +2 +2   Triceps +2 +2   Patella +2 +2   Achilles +2 +2     Normal tone b/l. Negative Babinski sign bilaterally.    Sensory system:  ? Superficial light touch sensation:WNL  ? Vibration sense: mild decrease in vibrations b/l, more on the left.   ? Position sense:WNL  ? Pinprick test for pain sensation:WNL  (WNL= within normal limits; UE= upper extremities; LE= lower extremities;  R= right, L= left).    Cerebellar/Coordination:  Rapid alternating movements, finger-to-nose and heel-to-shin  bilaterally demonstrate no abnormalities. No limb ataxia bilaterally. No gait ataxia. Romberg negative. Performs a tandem gait walk.    Gait: Normal stride, base and  armswing. Able to walks on toes, heels without difficulty.     Tests for meningeal irritation: negative.    Other tests/signs: SLRT negative b/l.     EDSS score = 3.5  .........................................................................................................................................Marland Kitchen  VISIT SUMMARY:  Ms. BREYANNA VALERA, a 46 y.o. African American right handed female  presented with MS.  Ms. SUPRENA TRAVAGLINI is instructed to schedule a follow-up with me in 3 months, or earlier, if needed.   Ms. MARGARIE MCGUIRT voiced a complete understanding of the diagnostic and treatment plan as detailed above. All questions were answered.   Start of Visit Time: 14:15h  End of Visit Time: 14:43h  Total visit time =  28 minutes    Greater than 50% of the face to face time was spent in consultation on the  disease process, and treatment planning, medication, dosing and side effects.     Thank you for the opportunity to contribute to the care of Ms. Kealohilani L Davidow.

## 2017-07-08 ENCOUNTER — Ambulatory Visit
Admission: RE | Admit: 2017-07-08 | Discharge: 2017-07-08 | Disposition: A | Discharge status: Home / Self Care | Payer: MEDICARE

## 2017-07-08 DIAGNOSIS — Z9884 Bariatric surgery status: Principal | ICD-10-CM

## 2017-07-08 DIAGNOSIS — G4733 Obstructive sleep apnea (adult) (pediatric): Secondary | ICD-10-CM

## 2017-07-08 DIAGNOSIS — N289 Disorder of kidney and ureter, unspecified: Principal | ICD-10-CM

## 2017-07-08 DIAGNOSIS — R079 Chest pain, unspecified: Secondary | ICD-10-CM

## 2017-07-08 DIAGNOSIS — G35 Multiple sclerosis: Secondary | ICD-10-CM

## 2017-07-08 DIAGNOSIS — E785 Hyperlipidemia, unspecified: Secondary | ICD-10-CM

## 2017-07-08 DIAGNOSIS — M5442 Lumbago with sciatica, left side: Secondary | ICD-10-CM

## 2017-07-08 DIAGNOSIS — D508 Other iron deficiency anemias: Secondary | ICD-10-CM

## 2017-07-08 DIAGNOSIS — G8929 Other chronic pain: Secondary | ICD-10-CM

## 2017-07-08 DIAGNOSIS — G4700 Insomnia, unspecified: Secondary | ICD-10-CM

## 2017-07-08 LAB — URINALYSIS
BILIRUBIN UA: NEGATIVE
BLOOD UA: NEGATIVE
GLUCOSE UA: NEGATIVE
KETONES UA: NEGATIVE
NITRITE UA: NEGATIVE
PH UA: 5.5 (ref 5.0–9.0)
RBC UA: 6 /HPF — ABNORMAL HIGH (ref <4)
SQUAMOUS EPITHELIAL: 15 /HPF — ABNORMAL HIGH (ref 0–5)
UROBILINOGEN UA: 2 — AB
WBC UA: 11 /HPF — ABNORMAL HIGH (ref 0–5)

## 2017-07-08 LAB — ALBUMIN / CREATININE URINE RATIO: ALBUMIN/CREATININE RATIO: 4.7 ug/mg (ref 0.0–30.0)

## 2017-07-08 LAB — ALBUMIN/CREATININE RATIO: Albumin/Creatinine:MRto:Pt:Urine:Qn:: 4.7

## 2017-07-08 LAB — RBC UA: Lab: 6 — ABNORMAL HIGH

## 2017-07-08 MED ORDER — PAROXETINE 30 MG TABLET
ORAL_TABLET | Freq: Every morning | ORAL | 3 refills | 0.00000 days | Status: CP
Start: 2017-07-08 — End: 2018-01-16

## 2017-07-08 MED ORDER — PANTOPRAZOLE 40 MG TABLET,DELAYED RELEASE
ORAL_TABLET | Freq: Every day | ORAL | 3 refills | 0 days | Status: CP
Start: 2017-07-08 — End: 2018-07-20

## 2017-07-08 NOTE — Unmapped (Addendum)
Kidney function:  Your kidney function is decreased, we will get urine studies and a renal (kidney) ultrasound - they will call you to schedule this. Avoid NSAIDs- like ibuprofen, advil, goody's powder, BC powder.     Chest pain: they will help you schedule a stress test at the front desk     Back pain:  Use over the counter lidocaine patches (Salon Pas). Try the exercises below. Use a heating pad as needed. Tylenol up to 3000 mg in 24 hours. If no improvement please let me know so I can refer you to physical therapy.    Low Back Pain: Exercises  Your Care Instructions  Here are some examples of typical rehabilitation exercises for your condition. Start each exercise slowly. Ease off the exercise if you start to have pain.  Your doctor or physical therapist will tell you when you can start these exercises and which ones will work best for you.  How to do the exercises  Press-up    1. Lie on your stomach, supporting your body with your forearms.  2. Press your elbows down into the floor to raise your upper back. As you do this, relax your stomach muscles and allow your back to arch without using your back muscles. As your press up, do not let your hips or pelvis come off the floor.  3. Hold for 15 to 30 seconds, then relax.  4. Repeat 2 to 4 times.  Alternate arm and leg (bird dog) exercise    1. Start on the floor, on your hands and knees.  2. Tighten your belly muscles.  3. Raise one leg off the floor, and hold it straight out behind you. Be careful not to let your hip drop down, because that will twist your trunk.  4. Hold for about 6 seconds, then lower your leg and switch to the other leg.  5. Repeat 8 to 12 times on each leg.  6. Over time, work up to holding for 10 to 30 seconds each time.  7. If you feel stable and secure with your leg raised, try raising the opposite arm straight out in front of you at the same time.  Knee-to-chest exercise    1. Lie on your back with your knees bent and your feet flat on the floor.  2. Bring one knee to your chest, keeping the other foot flat on the floor (or keeping the other leg straight, whichever feels better on your lower back).  3. Keep your lower back pressed to the floor. Hold for at least 15 to 30 seconds.  4. Relax, and lower the knee to the starting position.  5. Repeat with the other leg. Repeat 2 to 4 times with each leg.  6. To get more stretch, put your other leg flat on the floor while pulling your knee to your chest.  Curl-ups    1. Lie on the floor on your back with your knees bent at a 90-degree angle. Your feet should be flat on the floor, about 12 inches from your buttocks.  2. Cross your arms over your chest. If this bothers your neck, try putting your hands behind your neck (not your head), with your elbows spread apart.  3. Slowly tighten your belly muscles and raise your shoulder blades off the floor.  4. Keep your head in line with your body, and do not press your chin to your chest.  5. Hold this position for 1 or 2 seconds, then slowly lower  yourself back down to the floor.  6. Repeat 8 to 12 times.  Pelvic tilt exercise    1. Lie on your back with your knees bent.  2. Brace your stomach. This means to tighten your muscles by pulling in and imagining your belly button moving toward your spine. You should feel like your back is pressing to the floor and your hips and pelvis are rocking back.  3. Hold for about 6 seconds while you breathe smoothly.  4. Repeat 8 to 12 times.  Heel dig bridging    1. Lie on your back with both knees bent and your ankles bent so that only your heels are digging into the floor. Your knees should be bent about 90 degrees.  2. Then push your heels into the floor, squeeze your buttocks, and lift your hips off the floor until your shoulders, hips, and knees are all in a straight line.  3. Hold for about 6 seconds as you continue to breathe normally, and then slowly lower your hips back down to the floor and rest for up to 10 seconds.  4. Do 8 to 12 repetitions.  Hamstring stretch in doorway    1. Lie on your back in a doorway, with one leg through the open door.  2. Slide your leg up the wall to straighten your knee. You should feel a gentle stretch down the back of your leg.  3. Hold the stretch for at least 15 to 30 seconds. Do not arch your back, point your toes, or bend either knee. Keep one heel touching the floor and the other heel touching the wall.  4. Repeat with your other leg.  5. Do 2 to 4 times for each leg.  Hip flexor stretch    1. Kneel on the floor with one knee bent and one leg behind you. Place your forward knee over your foot. Keep your other knee touching the floor.  2. Slowly push your hips forward until you feel a stretch in the upper thigh of your rear leg.  3. Hold the stretch for at least 15 to 30 seconds. Repeat with your other leg.  4. Do 2 to 4 times on each side.  Wall sit    1. Stand with your back 10 to 12 inches away from a wall.  2. Lean into the wall until your back is flat against it.  3. Slowly slide down until your knees are slightly bent, pressing your lower back into the wall.  4. Hold for about 6 seconds, then slide back up the wall.  5. Repeat 8 to 12 times.  Follow-up care is a key part of your treatment and safety. Be sure to make and go to all appointments, and call your doctor if you are having problems. It's also a good idea to know your test results and keep a list of the medicines you take.  Where can you learn more?  Go to [facility] at [email or web address]  Select Preferences in the upper right hand corner, then select Health Library under Resources. Enter [phone] in the search box to learn more about Low Back Pain: Exercises.  Current as of: September 15, 2016  Content Version: 11.8  ?? 2006-2018 [employer]. Care instructions adapted under license by [facility]. If you have questions about a medical condition or this instruction, always ask your healthcare professional. [employer] disclaims any warranty or liability for your use of this information.

## 2017-07-08 NOTE — Unmapped (Signed)
Addended by: Oran Rein on: 07/08/2017 03:08 PM     Modules accepted: Orders

## 2017-07-09 NOTE — Unmapped (Signed)
I reviewed with the resident the medical history and the resident???s findings on physical examination.  I discussed with the resident the patient???s diagnosis and concur with the treatment plan as documented in the resident note.     Patient reports classic chest pain which is concerning for stable angina (chest pain with activity relieved by rest). EKG in clinic today was normal. We will evaluate with exercise stress test, also consider repeat Echo since baseline in 2015 was slightly abnormal.     Tobey Bride, MD

## 2017-07-11 MED ORDER — TERIFLUNOMIDE 14 MG TABLET
Freq: Every day | ORAL | 2 refills | 0.00000 days | Status: CP
Start: 2017-07-11 — End: 2017-07-11

## 2017-07-11 MED ORDER — TERIFLUNOMIDE 14 MG TABLET: each | 2 refills | 0 days

## 2017-07-11 NOTE — Unmapped (Signed)
[facility] Refill Coordination Note  Specialty Medication(s): Victoria Kelly, DOB: 04/28/1971  Phone: [phone] (home) , Alternate phone contact: N/A  Phone or address changes today?: No  All above HIPAA information was verified with patient.  Shipping Address: [address]  [place] [address]   Insurance changes? No    Completed refill call assessment today to schedule patient's medication shipment from the [facility] [phone]).      Confirmed the medication and dosage are correct and have not changed: Yes, regimen is correct and unchanged.    Confirmed patient started or stopped the following medications in the past month:  No, there are no changes reported at this time.    Are you tolerating your medication?:  Victoria Kelly reports tolerating the medication.    ADHERENCE    (Below is required for Medicare Part B or Transplant patients only - per drug):   How many tablets were dispensed last month: 28  Patient currently has 4 OR remaining.    Did you miss any doses in the past 4 weeks? No missed doses reported.    FINANCIAL/SHIPPING    Delivery Scheduled: Yes, Expected medication delivery date: 07/14/17     Victoria Kelly did not have any additional questions at this time.    Delivery address validated in FSI scheduling system: Yes, address listed in FSI is correct.    We will follow up with patient monthly for standard refill processing and delivery.      Thank you,  Westley Gambles   [facility] Shared [facility] Specialty Technician

## 2017-07-12 MED FILL — AUBAGIO/14MG/TABS: AUBAGIO/14MG/TABS | 28 days supply | Qty: 28 | Fill #0

## 2017-07-14 DIAGNOSIS — R7989 Other specified abnormal findings of blood chemistry: Secondary | ICD-10-CM | POA: Insufficient documentation

## 2017-08-02 NOTE — Unmapped (Signed)
[facility] Refill Coordination Note  Specialty Medication(s): Victoria Kelly, DOB: [birth year]  Phone: [phone] (home) , Alternate phone contact: N/A  Phone or address changes today?: No  All above HIPAA information was verified with patient.  Shipping Address: [address]  [place] [address]   Insurance changes? No    Completed refill call assessment today to schedule patient's medication shipment from the [facility] [phone]).      Confirmed the medication and dosage are correct and have not changed: Yes, regimen is correct and unchanged.    Confirmed patient started or stopped the following medications in the past month:  No, there are no changes reported at this time.    Are you tolerating your medication?:  Victoria Kelly reports tolerating the medication.    ADHERENCE    (Below is required for Medicare Part B or Transplant patients only - per drug):   How many tablets were dispensed last month: 28  Patient currently has ABOUT 10 remaining.    Did you miss any doses in the past 4 weeks? No missed doses reported.    FINANCIAL/SHIPPING    Delivery Scheduled: Yes, Expected medication delivery date: 08/09/17     Victoria Kelly did not have any additional questions at this time.    Delivery address validated in FSI scheduling system: Yes, address listed in FSI is correct.    We will follow up with patient monthly for standard refill processing and delivery.      Thank you,  Westley Gambles   [facility] Shared [facility] Specialty Technician

## 2017-08-02 NOTE — Unmapped (Signed)
Please click on Chart from this encounter, double click on this encounter from the chart,  and follow these instructions.     See Patient-Level Documents below. Locate recently scanned External Records labeled Print and Sign. Click hyperlink to view and print paperwork. Please complete within 2 business days.     Once completed, please place in nearest Internal Medicine outbox to be faxed.     Please let me know if I can do anything to help you. Thanks.

## 2017-08-04 NOTE — Unmapped (Signed)
Please click on Chart from this encounter, double click on this encounter from the chart,  and follow these instructions.     See Patient-Level Documents below. Locate recently scanned External Records labeled Print and Sign. Click hyperlink to view and print paperwork. Please complete within 2 business days.     Once completed, please place in nearest Internal Medicine outbox to be faxed.     Please let me know if I can do anything to help you. Thanks.

## 2017-08-04 NOTE — Unmapped (Deleted)
Internal Medicine Clinic Visit    HPI: Victoria Kelly is a 46 year old female with a history of multiple sclerosis, gastric bypass, depression, GERD who presents for ***    Last visit 07/08/2017 she reported chest pain with exertion for a few months. An exercises stress test was ordered, but she has not completed it.     ***     ***  __________________________________________________________    Problem List:  Patient Active Problem List   Diagnosis   ??? Multiple sclerosis (CMS-HCC)   ??? Dysthymic disorder   ??? Obesity   ??? Obstructive sleep apnea   ??? Weakness of wrist   ??? Ankle arthritis   ??? Allergic rhinitis   ??? Insomnia   ??? Low back pain   ??? Iron deficiency anemia   ??? Acne   ??? H/O cataract removal with insertion of prosthetic lens   ??? Hyperlipidemia   ??? History of bariatric surgery   ??? Elevated serum creatinine       Medications:  Reviewed in EPIC  __________________________________________________________    Physical Exam:   Vital Signs:  There were no vitals filed for this visit.    Gen: Well appearing, NAD  CV: RRR, no murmurs  Pulm: CTA bilaterally, no crackles or wheezes  Abd: Soft, NTND, normal BS. No HSM.  Ext: No edema  ***  ____________________________________________________________    Assessment & Plan:   ??  Chest pain: Sternal chest pain w/ exertion, lasting 5 minutes, resolves with rest, no other associated symptoms (diaphoresis, SOB, nausea, vomiting, arm pain, jaw pain). No hx of CAD although she is obese, no family history of CAD, she is a non smoker, no known diabetes, and age < 38 therefore overall low risk for CAD however her symptoms are very typical for angina therefore will proceed w/ stress test. She does have GERD that she reports is uncontrolled, would be odd for her GERD to only be with exertion.   - EKG in clinic w/ sinus brady, no TWI, no LVH, no Q waves  - Referral for exercise stress test  - Treatment of GERD as below.   ??  Renal dysfunction: Cr elevated since 2018 (prev normal 2015), no hx of [name] although upon review has been elevated in the past to 1.1. No HTN or DM, no family hx of [name].  - Cr. 1.3   - D/C NSAIDs  - Renal ultrasound  - UA: lrg leuk esterase but 15 squams, trace protein.  - Urine microalb:Cr - no albuminuria  ??  GERD:   - Protonix 40 mg daily  ??  Back pain: right sided low back pain without red flag symptoms.   - Tylenol, Salon pas OTC, heating pad, exercises provided  - Discussed weight loss  ??  S/p Gastric Bypass:   - Following labs completed 10/2016: CBC, BMP, Iron, TIBC, Ferritin, Copper, Zinc, Vit B1, Folate, A1c - recheck annually.   - Continue MVM, iron supplement, vitamin D  ??  Insomnia: Trazodone 50 mg qhs, melatonin 5 mg qhs   ??  Vitamin D Deficiency: Vit D (25OH) 8 in 10/2016; on Vit D 4000 IU/day, to be check by Neurology at next visit.   ??  HCM  Cancer screening:   -Colon cancer: not due  -Breast cancer: Mammogram 11/2016 negative  -Cervical cancer: s/p total hysterectomy   -Lung cancer (smoker): n/a  Labs:  - Lipids: HDL 198, LDL 135, HDL 50, TG 66 (08/2015)  - HbA1c: 4.9% (10/2016)  -  HIV: Nonreactive 2013

## 2017-08-08 MED FILL — AUBAGIO/14MG/TABS: AUBAGIO/14MG/TABS | 28 days supply | Qty: 28 | Fill #1

## 2017-08-16 ENCOUNTER — Ambulatory Visit
Admission: RE | Admit: 2017-08-16 | Discharge: 2017-08-16 | Disposition: A | Discharge status: Home / Self Care | Payer: MEDICARE

## 2017-08-16 DIAGNOSIS — R079 Chest pain, unspecified: Principal | ICD-10-CM

## 2017-08-24 NOTE — Unmapped (Signed)
Called to discuss stress test, message left, will continue to reach out.

## 2017-08-25 NOTE — Unmapped (Signed)
Please eprescribe teed up medication to pharmacy on encounter.  Please let me know if I can do anything to help you.  Thank you.

## 2017-09-01 NOTE — Unmapped (Signed)
[facility] Refill Coordination Note  Specialty Medication(s): AUBAGIO  Additional Medications shipped: [employer], DOB: Feb 27, 1971  Phone: [phone] (home) , Alternate phone contact: N/A  Phone or address changes today?: No  All above HIPAA information was verified with patient.  Shipping Address: [address]  [place] [address]   Insurance changes? No    Completed refill call assessment today to schedule patient's medication shipment from the [facility] [phone]).      Confirmed the medication and dosage are correct and have not changed: Yes, regimen is correct and unchanged.    Confirmed patient started or stopped the following medications in the past month:  No, there are no changes reported at this time.    Are you tolerating your medication?:  Victoria Kelly reports tolerating the medication.    ADHERENCE    Did you miss any doses in the past 4 weeks? No missed doses reported.    FINANCIAL/SHIPPING    Delivery Scheduled: Yes, Expected medication delivery date: 09/07/17     Victoria Kelly did not have any additional questions at this time.    Delivery address validated in FSI scheduling system: Yes, address listed in FSI is correct.    We will follow up with patient monthly for standard refill processing and delivery.      Thank you,  Marletta Lor   [facility] Shared [facility] Specialty Pharmacist

## 2017-09-05 MED FILL — AUBAGIO/14MG/TABS: AUBAGIO/14MG/TABS | 28 days supply | Qty: 28 | Fill #2

## 2017-09-06 NOTE — Unmapped (Signed)
Discussed cardiac stress test results which showed intermediate probability of clinically significant CAD, however she could not fully complete the test due to fatigue. No ST changes on EKG at max heart rate or at rest. She denied CP with the testing, just overall fatigue which she attributes to being overweight. She has set a goal to lose 40 lb in 6 months.     Overall her chest pain has resolved with the addition of Protonix daly.

## 2017-09-13 NOTE — Unmapped (Deleted)
Internal Medicine Clinic Visit    HPI: [name] is a 46 year old female with a history of multiple sclerosis, gastric bypass, depression, GERD who presents for follow up. Recently seen 07/08/2017 at which time she was having exertional chest pain, treadmill stress test with intermediate probability of clinically significant coronary artery disease. The chest pain has since resolved with treatment of GERD with ***.     ***  __________________________________________________________    Problem List:  Patient Active Problem List   Diagnosis   ??? Multiple sclerosis (CMS-HCC)   ??? Dysthymic disorder   ??? Obesity   ??? Obstructive sleep apnea   ??? Weakness of wrist   ??? Ankle arthritis   ??? Allergic rhinitis   ??? Insomnia   ??? Low back pain   ??? Iron deficiency anemia   ??? Acne   ??? H/O cataract removal with insertion of prosthetic lens   ??? Hyperlipidemia   ??? History of bariatric surgery   ??? Elevated serum creatinine       Medications:  Reviewed in EPIC  __________________________________________________________    Physical Exam:   Vital Signs:  There were no vitals filed for this visit.    Gen: Well appearing, NAD  CV: RRR, no murmurs  Pulm: CTA bilaterally, no crackles or wheezes  Abd: Soft, NTND, normal BS. No HSM.  Ext: No edema  ***  ____________________________________________________________    Assessment & Plan:    Renal dysfunction: Cr elevated since 2018 (prev normal 2015), no hx of [name] although upon review has been elevated in the past to 1.1. No HTN or DM, no family hx of [name].  - D/C NSAIDs  - Renal ultrasound - ordered not completed  - UA - no protein  - Urine microalb:Cr - normal   ??  GERD:   - Protonix 40 mg daily  ??  Back pain: right sided low back pain without red flag symptoms.   - Tylenol, Salon pas OTC, heating pad, exercises provided  - Discussed weight loss  ??  S/p Gastric Bypass:   - Following labs completed 10/2016: CBC, BMP, Iron, TIBC, Ferritin, Copper, Zinc, Vit B1, Folate, A1c - recheck annually.   - Continue MVM, iron supplement, vitamin D  ??  Insomnia: Trazodone 50 mg qhs, melatonin 5 mg qhs   ??  Vitamin D Deficiency: Vit D (25OH) 8 in 10/2016; on Vit D 4000 IU/day, to be check by Neurology at next visit.   ??  HCM  Cancer screening:   -Colon cancer: not due  -Breast cancer: Mammogram 11/2016 negative  -Cervical cancer: s/p total hysterectomy   -Lung cancer (smoker): n/a  Labs:  - Lipids: HDL 198, LDL 135, HDL 50, TG 66 (08/2015)  - HbA1c: 4.9% (10/2016)  - HIV: Nonreactive 2013

## 2017-09-26 MED ORDER — TRAZODONE 50 MG TABLET
ORAL_TABLET | Freq: Every evening | ORAL | 0 refills | 0.00000 days | Status: CP
Start: 2017-09-26 — End: 2018-01-17

## 2017-10-03 MED ORDER — TERIFLUNOMIDE 14 MG TABLET: 14 mg | each | 0 refills | 0 days

## 2017-10-03 MED ORDER — TERIFLUNOMIDE 14 MG TABLET
Freq: Every day | ORAL | 0 refills | 0.00000 days | Status: CP
Start: 2017-10-03 — End: 2017-10-03

## 2017-10-03 NOTE — Unmapped (Signed)
Sending refill per pharmacy request. Patient has appt later this month with Dr. Johnnye Lana but she doesn't have enough to last until then. Will send 1 month supply for now and can give further refills at appointment.    [name], PharmD, CPP  Clinical Pharmacist, [facility]  Phone: [phone]

## 2017-10-03 NOTE — Unmapped (Signed)
[facility] Pharm[facility] Refill Coordination Note  Specialty Medication(s): AUBAGIO [address] [place], DOB: 1971/08/17  Phone: [phone] (home) , Alternate phone contact: N/A  Phone or address changes today?: No  All above HIPAA information was verified with patient.  Shipping Address: [address]  [place] [address]   Insurance changes? No    Completed refill call assessment today to schedule patient's medication shipment from the [facility] [phone]).      Confirmed the medication and dosage are correct and have not changed: Yes, regimen is correct and unchanged.    Confirmed patient started or stopped the following medications in the past month:  No, there are no changes reported at this time.    Are you tolerating your medication?:  Victoria Kelly reports tolerating the medication.    ADHERENCE        Did you miss any doses in the past 4 weeks? No missed doses reported.    FINANCIAL/SHIPPING    Delivery Scheduled: Yes, Expected medication delivery date: 10/06/17     Victoria Kelly did not have any additional questions at this time.    Delivery address validated in FSI scheduling system: Yes, address listed in FSI is correct.    We will follow up with patient monthly for standard refill processing and delivery.      Thank you,  Westley Gambles   [facility] Shared [facility] Specialty Technician

## 2017-10-04 MED FILL — AUBAGIO/14MG/TABS: AUBAGIO/14MG/TABS | 28 days supply | Qty: 28 | Fill #0

## 2017-10-14 NOTE — Unmapped (Signed)
No show.    Victoria Kelly      This encounter was created in error - please disregard.

## 2017-10-28 MED ORDER — TERIFLUNOMIDE 14 MG TABLET: 14 mg | each | 0 refills | 0 days

## 2017-10-28 MED ORDER — TERIFLUNOMIDE 14 MG TABLET
ORAL_TABLET | Freq: Every day | ORAL | 0 refills | 0.00000 days | Status: CP
Start: 2017-10-28 — End: 2017-10-28

## 2017-10-28 MED ORDER — TERIFLUNOMIDE 14 MG TABLET: 14 mg | tablet | Freq: Every day | 0 refills | 0 days | Status: AC

## 2017-10-28 NOTE — Unmapped (Signed)
Specialty Pharmacy - Neurology Medication Clinical Assessment and Refill Coordination      Victoria Kelly is a 47 y.o. female contacted today regarding refills of her specialty medication(s) teriflunomide (AUBAGIO)      Reviewed and verified with patient: Allergies - Medications -      Specialty medication(s) and dose(s) confirmed: yes  Changes to medications: no  Changes to insurance: no    Patient reports new MS disease activity: patient reports no new MS symptoms that she's noticed    Medication Adherence    Patient reported X missed doses in the last month:  0  Specialty Medication:  Aubagio  Patient is on additional specialty medications:  No  Patient is on more than two specialty medications:  No  Any gaps in refill history greater than 2 weeks in the last 3 months:  no  Demonstrates understanding of importance of adherence:  yes  Informant:  patient  Reliability of informant:  fairly reliable  Provider-estimated medication adherence level:  good  Patient is at risk for Non-Adherence:  No  Reasons for non-adherence:  no problems identified  Confirmed plan for next specialty medication refill:  delivery by pharmacy       Refill Coordination    Has the Patients' Contact Information Changed:  No  Is the Shipping Address Different:  Yes  Updated Shipping Address:  [address] [place] [address] (updated in fsi)       Shipping Information    Delivery Scheduled:  Yes  Delivery Date:  11/01/17  Medications to be Shipped:  Aubagio - patient has 1 week remaining       Adverse Effects    *All other systems reviewed and are negative       Drug Interactions    Drug interactions evaluated:  yes  Clinically relevant drug interactions identified:  no  Provided the patient with educational material regarding drug interactions:  not applicable       Patient Counseling    Counseled the patient on the following:  lab monitoring and follow-up discussed, pharmacy contact information discussed         Of note, patient is extremely past due for safety monitoring lab work. Advised her that this would be the LAST prescription until lab work is complete and that she needs to make a follow up appointment with Dr. Johnnye Lana. She verbalized understanding and agreed to be transferred to the triage line to make an appointment. Reiterated that this is for her safety and the labs must be done.    [name], PharmD, CPP  Clinical Pharmacist, [facility]  Phone: [phone]

## 2017-10-28 NOTE — Unmapped (Signed)
Addended by: Jeanice Lim on: 10/28/2017 03:20 PM     Modules accepted: Orders

## 2017-10-31 MED FILL — AUBAGIO/14MG/TABS: AUBAGIO/14MG/TABS | 28 days supply | Qty: 28 | Fill #0

## 2017-11-23 NOTE — Unmapped (Signed)
[facility] Refill Coordination Note  Specialty Medication(s): AUBAGIO [address] [place], DOB: 27-Mar-1971  Phone: [phone] (home) , Alternate phone contact: N/A  Phone or address changes today?: No  All above HIPAA information was verified with patient.  Shipping Address: [address]  [place] [address]   Insurance changes? No    Completed refill call assessment today to schedule patient's medication shipment from the [facility] [phone]).      Confirmed the medication and dosage are correct and have not changed: Yes, regimen is correct and unchanged.    Confirmed patient started or stopped the following medications in the past month:  No, there are no changes reported at this time.    Are you tolerating your medication?:  Victoria Kelly reports tolerating the medication.    ADHERENCE    (Below is required for Medicare Part B or Transplant patients only - per drug):   How many tablets were dispensed last month: 28  Patient currently has ABOUT 10 DAYS remaining.    Patient aware refill from MD is required. Advised patient we would call if any issues arise. Patient reports her mom had a procedure yesterday, so was unable to show for her Neuro appt. She reports she is rescheduled for next month.    Did you miss any doses in the past 4 weeks? No missed doses reported.    FINANCIAL/SHIPPING    Delivery Scheduled: Yes, Expected medication delivery date: 11/30/17     Victoria Kelly did not have any additional questions at this time.    Delivery address validated in FSI scheduling system: Yes, address listed in FSI is correct.    We will follow up with patient monthly for standard refill processing and delivery.      Thank you,  Westley Gambles   [facility] Specialty Technician

## 2017-11-24 MED ORDER — TERIFLUNOMIDE 14 MG TABLET
Freq: Every day | ORAL | 0 refills | 0.00000 days | Status: CP
Start: 2017-11-24 — End: 2017-11-24

## 2017-11-24 MED ORDER — TERIFLUNOMIDE 14 MG TABLET: 14 mg | tablet | Freq: Every day | 0 refills | 0 days | Status: AC

## 2017-11-24 NOTE — Unmapped (Signed)
Pharmacy is requesting refill for Aubagio. Patient had appointment to be seen on 2/5 but had to cancel because her mother had a procedure at the hospital. She rescheduled for March and is aware she need to come to this appointment. Will refill for 1 more month with instructions that she must come to appointment for further refills. She is significantly past due for safety lab work which I have discussed with her via phone in the past.    [name], PharmD, CPP  Clinical Pharmacist, [facility]  Phone: [phone]

## 2017-11-29 MED FILL — AUBAGIO/14MG/TABS: AUBAGIO/14MG/TABS | 28 days supply | Qty: 28 | Fill #0

## 2017-12-19 MED ORDER — PAROXETINE 30 MG TABLET
ORAL_TABLET | 1 refills | 0 days | Status: CP
Start: 2017-12-19 — End: ?

## 2017-12-20 NOTE — Unmapped (Deleted)
Internal Medicine Clinic Visit    HPI: HPI: Victoria Kelly is a 47 year old female with a history of multiple sclerosis, gastric bypass, depression, GERD who presents for follow up. She was last seen in [facility] by myself on 07/08/2017 at which time she was having exertional chest pain, she underwent exercise stress test that revealed an intermediate probability of clinically significant CAD. Chest pain resolved with omepazole.    Additionally elevated Cr, no albuminuria, renal ultrasound ordered but not obtained.    She did not attend her neurology appt for her MS in December.         ***  __________________________________________________________    Problem List:  Patient Active Problem List   Diagnosis   ??? Multiple sclerosis (CMS-HCC)   ??? Dysthymic disorder   ??? Obesity   ??? Obstructive sleep apnea   ??? Weakness of wrist   ??? Ankle arthritis   ??? Allergic rhinitis   ??? Insomnia   ??? Low back pain   ??? Iron deficiency anemia   ??? Acne   ??? H/O cataract removal with insertion of prosthetic lens   ??? Hyperlipidemia   ??? History of bariatric surgery   ??? Elevated serum creatinine   ??? Morbid (severe) obesity due to excess calories (CMS-HCC)       Medications:  Reviewed in EPIC  __________________________________________________________    Physical Exam:   Vital Signs:  There were no vitals filed for this visit.    Gen: Well appearing, NAD  CV: RRR, no murmurs  Pulm: CTA bilaterally, no crackles or wheezes  Abd: Soft, NTND, normal BS. No HSM.  Ext: No edema  ***  ____________________________________________________________    Assessment & Plan:    S/p Gastric Bypass:   - Following labs completed 10/2016: CBC, BMP, Iron, TIBC, Ferritin, Copper, Zinc, Vit B1, Folate, A1c - recheck annually.   - Continue MVM, iron supplement, vitamin D    Vitamin D Deficiency: Vit D (25OH) 8 in 10/2016; on Vit D 4000 IU/day, to be check by Neurology at next visit.

## 2017-12-21 NOTE — Unmapped (Deleted)
Internal Medicine Clinic Visit    HPI: ??[name] is a 47 year old female with a history of multiple sclerosis, gastric bypass, depression, GERD who presents for follow up. She was last seen in [facility] by myself on 07/08/2017 at which time she was having exertional chest pain, she underwent exercise stress test that revealed an intermediate probability of clinically significant CAD. Chest pain resolved with omepazole.  ??  Additionally elevated Cr, no albuminuria, renal ultrasound ordered but not obtained.  ??  She did not attend her neurology appt for her MS in December.     ***  __________________________________________________________    Problem List:  Patient Active Problem List   Diagnosis   ??? Multiple sclerosis (CMS-HCC)   ??? Dysthymic disorder   ??? Obesity   ??? Obstructive sleep apnea   ??? Weakness of wrist   ??? Ankle arthritis   ??? Allergic rhinitis   ??? Insomnia   ??? Low back pain   ??? Iron deficiency anemia   ??? Acne   ??? H/O cataract removal with insertion of prosthetic lens   ??? Hyperlipidemia   ??? History of bariatric surgery   ??? Elevated serum creatinine   ??? Morbid (severe) obesity due to excess calories (CMS-HCC)       Medications:  Reviewed in EPIC  __________________________________________________________    Physical Exam:   Vital Signs:  There were no vitals filed for this visit.    Gen: Well appearing, NAD  CV: RRR, no murmurs  Pulm: CTA bilaterally, no crackles or wheezes  Abd: Soft, NTND, normal BS. No HSM.  Ext: No edema  ***  ____________________________________________________________    Assessment & Plan:    S/p Gastric Bypass:   - Following labs completed 10/2016: CBC, BMP, Iron, TIBC, Ferritin, Copper, Zinc, Vit B1, Folate, A1c - recheck annually.   - Continue MVM, iron supplement, vitamin D  ??  Vitamin D Deficiency: Vit D (25OH) 8 in 10/2016; on Vit D 4000 IU/day, to be check by Neurology at next visit.

## 2017-12-22 NOTE — Unmapped (Signed)
[facility] Refill Coordination Note  Specialty Medication(s): AUBAGIO 14 MG    Tanika L Gouveia, DOB: 06-30-1971  Phone: [phone] (home) , Alternate phone contact: N/A  Phone or address changes today?: No  All above HIPAA information was verified with patient.  Shipping Address: [address]  [place] [address]   Insurance changes? No    Completed refill call assessment today to schedule patient's medication shipment from the [facility] [phone]).      Confirmed the medication and dosage are correct and have not changed: Yes, regimen is correct and unchanged.    Confirmed patient started or stopped the following medications in the past month:  No, there are no changes reported at this time.    Are you tolerating your medication?:  Ellie reports tolerating the medication.    ADHERENCE    (Below is required for Medicare Part B or Transplant patients only - per drug):   How many tablets were dispensed last month:  Patient currently has 1WEEK remaining.    Did you miss any doses in the past 4 weeks? No missed doses reported.    FINANCIAL/SHIPPING    Delivery Scheduled: Yes, Expected medication delivery date: 031519     The patient will receive an FSI print out for each medication shipped and additional FDA Medication Guides as required.  Patient education from [facility] or [name] may also be included in the shipment    Aleni did not have any additional questions at this time.    Delivery address validated in FSI scheduling system: Yes, address listed in FSI is correct.    We will follow up with patient monthly for standard refill processing and delivery.      Thank you,  Antonietta Barcelona   [facility] Specialty Technician

## 2017-12-26 NOTE — Unmapped (Signed)
No show  This encounter was created in error - please disregard.

## 2018-01-01 NOTE — Unmapped (Signed)
The patient is past due foro the office visit and has not done the safety labs for Aubagio.   New orders for CBC/diff, creatinine and LFTs placed.     RN to fax the orders to the PCP.    Victoria Kelly

## 2018-01-02 NOTE — Unmapped (Signed)
-----   Message from Sarita Bottom, MD sent at 12/31/2017  5:27 PM EDT -----  Regarding: RE: Need Refill  Hi Stephanie,   I would not refill until she does the safety labs.   Landyn Lorincz, please call the patient to see:    1) if she is able to come for the office visit with me or Clara soon?  2) If not, then she will need to have labs done with the PCP  3) we will refill Aubagio when we see the labs. Please explain to the patient that  Aubagio  stays in the system for a long time, so it is safe to stop the medication until we see the labs. Orders are in the Epic. If she agrees with local labs, please fax the lab orders to her PCP and inform  the patient that you did it.       Thank you.   Sharmon Revere  ----- Message -----  From: [name], CPP  Sent: 12/29/2017   8:44 AM  To: Sarita Bottom, MD  Subject: Annell Greening: Need Refill                                  Hi Irena,    The pharmacy is requesting a refill of Aubagio but the patient has no showed her most recent appointments and hasn't been seen since sept 2018. She has not been compliant with safety lab work and she was instructed at last fill that she must be seen. What are your thoughts on future refills?     Thanks,  Judeth Cornfield   ----- Message -----  From: Oralia Rud  Sent: 12/29/2017   7:37 AM  To: [name], CPP, [facility]  Subject: Need Refill                                      Good Morning  Can we please get a refill on her [name] that needs to go out today.  Thanks   [employer]   [facility]

## 2018-01-02 NOTE — Unmapped (Signed)
Notified patient of necessity to set up the appointment for labs and office visit. Connected the patient to the scheduling desk and facilitated the appointment setup.

## 2018-01-16 ENCOUNTER — Encounter: Admit: 2018-01-16 | Discharge: 2018-01-17 | Payer: MEDICARE

## 2018-01-16 ENCOUNTER — Ambulatory Visit: Admit: 2018-01-16 | Discharge: 2018-01-17 | Payer: MEDICARE | Attending: Neurology | Primary: Neurology

## 2018-01-16 DIAGNOSIS — Z90710 Acquired absence of both cervix and uterus: Secondary | ICD-10-CM | POA: Insufficient documentation

## 2018-01-16 DIAGNOSIS — E611 Iron deficiency: Secondary | ICD-10-CM

## 2018-01-16 DIAGNOSIS — Z9884 Bariatric surgery status: Principal | ICD-10-CM

## 2018-01-16 DIAGNOSIS — G35 Multiple sclerosis: Principal | ICD-10-CM

## 2018-01-16 DIAGNOSIS — G4700 Insomnia, unspecified: Secondary | ICD-10-CM

## 2018-01-16 DIAGNOSIS — N3281 Overactive bladder: Secondary | ICD-10-CM

## 2018-01-16 DIAGNOSIS — M792 Neuralgia and neuritis, unspecified: Secondary | ICD-10-CM

## 2018-01-16 LAB — COMPREHENSIVE METABOLIC PANEL
ALBUMIN: 3.8 g/dL (ref 3.5–5.0)
ALKALINE PHOSPHATASE: 92 U/L (ref 38–126)
ALT (SGPT): 26 U/L (ref 15–48)
ANION GAP: 7 mmol/L — ABNORMAL LOW (ref 9–15)
AST (SGOT): 22 U/L (ref 14–38)
BILIRUBIN TOTAL: 0.5 mg/dL (ref 0.0–1.2)
BLOOD UREA NITROGEN: 14 mg/dL (ref 7–21)
BUN / CREAT RATIO: 13
CHLORIDE: 107 mmol/L (ref 98–107)
CO2: 28 mmol/L (ref 22.0–30.0)
CREATININE: 1.04 mg/dL — ABNORMAL HIGH (ref 0.60–1.00)
EGFR MDRD AF AMER: >=60 mL/min/{1.73_m2} (ref >=60)
EGFR MDRD NON AF AMER: 57 mL/min/{1.73_m2} — ABNORMAL LOW (ref >=60)
GLUCOSE RANDOM: 91 mg/dL (ref 65–179)
PROTEIN TOTAL: 7.5 g/dL (ref 6.5–8.3)
SODIUM: 142 mmol/L (ref 135–145)

## 2018-01-16 LAB — CBC W/ AUTO DIFF
BASOPHILS ABSOLUTE COUNT: 0.1 10*9/L (ref 0.0–0.1)
BASOPHILS RELATIVE PERCENT: 1.3 %
EOSINOPHILS ABSOLUTE COUNT: 0.2 10*9/L (ref 0.0–0.4)
EOSINOPHILS RELATIVE PERCENT: 5.4 %
HEMATOCRIT: 40.7 % (ref 36.0–46.0)
HEMOGLOBIN: 13.2 g/dL (ref 12.0–16.0)
LYMPHOCYTES ABSOLUTE COUNT: 1.3 10*9/L — ABNORMAL LOW (ref 1.5–5.0)
LYMPHOCYTES RELATIVE PERCENT: 31.6 %
MEAN CORPUSCULAR HEMOGLOBIN: 28.7 pg (ref 26.0–34.0)
MEAN CORPUSCULAR VOLUME: 88.6 fL (ref 80.0–100.0)
MONOCYTES ABSOLUTE COUNT: 0.2 10*9/L (ref 0.2–0.8)
MONOCYTES RELATIVE PERCENT: 6.1 %
NEUTROPHILS ABSOLUTE COUNT: 2.1 10*9/L (ref 2.0–7.5)
NEUTROPHILS RELATIVE PERCENT: 52.7 %
PLATELET COUNT: 221 10*9/L (ref 150–440)
RED BLOOD CELL COUNT: 4.6 10*12/L (ref 4.00–5.20)
RED CELL DISTRIBUTION WIDTH: 14.3 % (ref 12.0–15.0)
WBC ADJUSTED: 4 10*9/L — ABNORMAL LOW (ref 4.5–11.0)

## 2018-01-16 LAB — VITAMIN B-12: Cobalamins:MCnc:Pt:Ser/Plas:Qn:: 364

## 2018-01-16 LAB — FOLATE: Folate:MCnc:Pt:Ser/Plas:Qn:: 4.9

## 2018-01-16 LAB — WBC ADJUSTED: Lab: 4 — ABNORMAL LOW

## 2018-01-16 LAB — CO2: Carbon dioxide:SCnc:Pt:Ser/Plas:Qn:: 28

## 2018-01-16 LAB — FERRITIN: Ferritin:MCnc:Pt:Ser/Plas:Qn:: 40.7

## 2018-01-16 MED ORDER — PREGABALIN 75 MG CAPSULE
ORAL_CAPSULE | Freq: Two times a day (BID) | ORAL | 2 refills | 0.00000 days | Status: CP
Start: 2018-01-16 — End: 2018-08-01

## 2018-01-16 MED ORDER — OXYBUTYNIN CHLORIDE ER 5 MG TABLET,EXTENDED RELEASE 24 HR
ORAL_TABLET | Freq: Every day | ORAL | 5 refills | 0 days | Status: CP
Start: 2018-01-16 — End: 2018-06-21

## 2018-01-16 NOTE — Unmapped (Addendum)
Labs today, I will call you with the results of your labs.   Try the bladder training as below in addition to the oxybutynin that your neurologist prescribed.   Checking kidney function today, we will discuss if we need to move forward with the kidney ultrasound after your labs.      Bladder Training: Care Instructions  Your Care Instructions    Bladder training is used to treat urge incontinence and stress incontinence. Urge incontinence means that the need to urinate comes on so fast that you can't get to a toilet in time. Stress incontinence means that you leak urine because of pressure on your bladder. For example, it may happen when you laugh, cough, or lift something heavy.  Bladder training can increase how long you can wait before you have to urinate. It can also help your bladder hold more urine. And it can give you better control over the urge to urinate.  It is important to remember that bladder training takes a few weeks to a few months to make a difference. You may not see results right away, but don't give up.  Follow-up care is a key part of your treatment and safety. Be sure to make and go to all appointments, and call your doctor if you are having problems. It's also a good idea to know your test results and keep a list of the medicines you take.  How can you care for yourself at home?  Work with your doctor to come up with a bladder training program that is right for you. You may use one or more of the following methods.  Delayed urination  ?? In the beginning, try to keep from urinating for 5 minutes after you first feel the need to go.  ?? While you wait, take deep, slow breaths to relax. Kegel exercises can also help you delay the need to go to the bathroom.  ?? After some practice, when you can easily wait 5 minutes to urinate, try to wait 10 minutes before you urinate.  ?? Slowly increase the waiting period until you are able to control when you have to urinate.  Scheduled urination  ?? Empty your bladder when you first wake up in the morning.  ?? Schedule times throughout the day when you will urinate.  ?? Start by going to the bathroom every hour, even if you don't need to go.  ?? Slowly increase the time between trips to the bathroom.  ?? When you have found a schedule that works well for you, keep doing it.  ?? If you wake up during the night and have to urinate, do it. Apply your schedule to waking hours only.  Kegel exercises  These tighten and strengthen pelvic muscles, which can help you control the flow of urine. To do Kegel exercises:  ?? Squeeze the same muscles you would use to stop your urine. Your belly and thighs should not move.  ?? Hold the squeeze for 3 seconds, and then relax for 3 seconds.  ?? Start with 3 seconds. Then add 1 second each week until you are able to squeeze for 10 seconds.  ?? Repeat the exercise 10 to 15 times a session. Do three or more sessions a day.  When should you call for help?  Watch closely for changes in your health, and be sure to contact your doctor if:  ?? ?? Your incontinence is getting worse.   ?? ?? You do not get better as expected.  Where can you learn more?  Go to [facility] at [email or web address]  Select Preferences in the upper right hand corner, then select Health Library under Resources. Enter [phone] in the search box to learn more about Bladder Training: Care Instructions.  Current as of: October 05, 2017  Content Version: 12.0  ?? 2006-2019 [employer]. Care instructions adapted under license by [facility]. If you have questions about a medical condition or this instruction, always ask your healthcare professional. [employer] Inc[employer] disclaims any warranty or liability for your use of this information.

## 2018-01-16 NOTE — Unmapped (Addendum)
1) Continue Paxil 60  mg in the evening  2) We plan to start you on Lyrica for the pain management: on day 1 take 1 pill at bedtime, starting from Day 2 and on take 1 pill two times a day.   3) We will start  you on Ditropan, 5 mg XL in the morning for the overactive bladder, please let us know if you would need the dose of Ditropan to be increased.   4) Continue with Paxil 60 mg in the evening.   5) Continue with the same dose of Vitamin D until we see the vitamiN D levels today. We will inform you if there will be a need to change the dose.   6) We ordered brain, C/T spine MRI studies  7) Schedule with Clara Zelasky in 6 months, earlier, if needed  8) Please see your gynecologist for the follow-up.     ?   In case of:  ? a suspected relapse (new symptoms or worsening existing symptoms, lasting for >24h)  OR  ? a need for an additional appointment for other reasons     Please contact:    [facility]  Phone: [phone]         Sarita Bottom, MD  Clinical Associate Professor of Neurology  [facility], Department of Neurology  Multiple Sclerosis/Neuroimmunology Division  [address] [place], [place], [place] [address]

## 2018-01-16 NOTE — Unmapped (Signed)
Internal Medicine Clinic Visit    HPI:Victoria Kelly is a 47 year old woman with a history of multiple sclerosis, gastric bypass, depression, GERD who presents for follow up. She was last seen in [facility] by myself on 07/08/2017 at which time she was having exertional chest pain, she underwent exercise stress test that revealed an intermediate probability of clinically significant CAD. Chest pain resolved with omeprazole.  ??  Additionally she has had an elevated Cr, no albuminuria, renal ultrasound ordered but not obtained. Her maternal aunt required HD, she had lupus and leukemia. Additionally her mother and first cousin both have lupus but no known renal disease. She does not use NSAIDs.      ??  She had f/u today with neurology after missing her last 2 appts, she reports worsening leg pain for which she was started on lyrica as well as some urge incontinence for which she was started on oxybutynin. She denies any recurrence of chest pain and is now just on omeprazole, she was able to come off of the zantac. She is taking vit D about once a week. She has some SOB which she attributes to being overweight. It has not worsened. She has started cutting out junk food and working on stopping sodas.     __________________________________________________________    Problem List:  Patient Active Problem List   Diagnosis   ??? Multiple sclerosis (CMS-HCC)   ??? Dysthymic disorder   ??? Obesity   ??? Obstructive sleep apnea   ??? Weakness of wrist   ??? Ankle arthritis   ??? Allergic rhinitis   ??? Insomnia   ??? Low back pain   ??? Iron deficiency anemia   ??? Acne   ??? H/O cataract removal with insertion of prosthetic lens   ??? Hyperlipidemia   ??? History of bariatric surgery   ??? Elevated serum creatinine   ??? Morbid (severe) obesity due to excess calories (CMS-HCC)       Medications:  Reviewed in EPIC  __________________________________________________________    Physical Exam:   Vital Signs:  Vitals:    01/16/18 1548   BP: 141/67   BP Site: L Arm   BP Position: Sitting   BP Cuff Size: Large   Pulse: 59   Temp: 36.4 ??C   TempSrc: Oral   SpO2: 98%   Weight: (!) 129.3 kg (285 lb)   Height: 170.2 cm (5' 7)       Gen: Well appearing, NAD  HEENT: PERRLA, MMM, no cervical LAD, no thyromegaly  CV: RRR, 2/6 SEM at RUSB  Pulm: CTA bilaterally, no crackles or wheezes  Abd: Soft, NTND, normal BS. No HSM.  Ext: No edema    ____________________________________________________________    Assessment & Plan: 15F with MS, Vit D def, and s/p gastric bypass returns for follow up.     Urinary incontinence, urge: Her Neurologist believes this is related to her MS and started Ditropan today. I also gave bladder training exercises today.     S/p Gastric Bypass:   - Recheck: CBC, BMP, Ferritin, Copper, Zinc, Vit B1, Vit B12, Folate today. Vit D checked by Neurologist  - Continue MVM, iron supplement, vitamin D  - Hates iron pill due to pill volume, takes every day. We can consider every other day pending her ferritin level  ??  Vitamin D Deficiency: Vit D (25OH) 8 in 10/2016; on Vit D 4000 IU/day - but takes about once a week.   - F/u vit D level  GERD: Continue omeprazole    MS: Followed by Dr. Johnnye Lana, worsening leg pain and new urinary incontinence. Started Lyrica today for leg pain. Continues on Aubagio.    Elevated Cr: Creatinine has been elevated since establishing care in 2014, max 1.30 in 06/2017 but today down to 1.04. Unclear etiology, no DM, HTN, proteinuria, ANA prev low titer at 1:80. She does have a significant family hx of lupus, but no symptoms to suggest this is the case. Given her Cr has normalized, will hold off on imaging and monitoring at least annually.    Anxiety: Paxil 60 mg daily    Patient completed HARK screening for IPV today and screened Negative  [name]7: 5  PHQ9: 8    The patient was discussed with Dr. Rosita Fire.   RTC in 6 months.

## 2018-01-16 NOTE — Unmapped (Signed)
[facility] at [facility]  Multiple Sclerosis/Neuroimmunology Division  Sarita Bottom, MD  Associate Professor of Neurology    DATE OF VISIT: 01/16/18    Re:  Victoria Kelly  [address]  [place] [address]  MRN: [id number]  DOB: Dec 20, 1970      Direct entry by: Dr. Sarita Bottom    Visit: Follow-up Visit      REASON FOR VISIT: [name], a 47 y.o. African American right handed female, is seen in consultation at the [facility], Multiple Sclerosis/Neuroimmunology Division for the follow-up of MS.  [name] was last time seen at the [facility] by on 07/06/2017.       Assessment:     1. I took a detailed intreval history of the present illness fromMs. Victoria Kelly , details on past medical history, family history and social history.  2. I personally reviewed  patient's interval medical records, radiology reports, and laboratory work.    3. I performed neurological examination.  5.  [name] agreed with the recommended diagnostic and treatment plan.                                                                                                                                                     ?? Multiple sclerosis:  Disease evolution is since 2003.   The patient is on Aubagio, tolerates well, She has had no flare since the last visit with me. Her MRI studies done in 10/2016 were stable compared to prior. She will continue with Aubagio for now. Please see the detailed plan below.     ?? Vitamin D deficiency:  Educated to continue with  Vitamin D 4000 IU/day until we see Vitamin D 25OH levels taken today.     ?? Elevated serum creatinine:  Was present before the start of Aubagio. Follow-up with PCP. Per FDA label, no Aubagio dosage adjustment is necessary for patients with mild, moderate, and severe renal impairment ([email or web address]).     Plan:     1) CBC/diff, CMP,  Vitamin D 25OH today.  2) We plan to start you on Lyrica for the Kelly management: on day 1 take 1 pill at bedtime, starting from Day 2 and on take 1 pill two times a day.   3) Rx Ditropan, 5 mg XL in the morning for the overactive bladder,increase if needed.    4) Continue with Paxil 60 mg in the evening. Continue with Aubagio 14mg /day. Continue with Trazodone 50 mg nightly for Victoria Kelly.   5) Continue with the same dose of Vitamin D until we see the vitamiN D levels today.   6) Brain, C/T spine MRI studies with and w/o contrast  7) Schedule a follow-up with Victoria Kelly in 6 months, earlier, if needed  8) recommend to see gynecologist for the follow-up due to the Victoria Kelly (rule out ovarian cyst).      Subjective:     Victoria Kelly, Victoria Kelly, Victoria Kelly, Victoria Kelly    HISTORY OF PRESENT ILLNESS:   Victoria Kelly has been diagnosed with MS in 2003.  In 2003 she had Victoria sided ON. Right sided ON occurred after a month. She now has color Kelly decrease. She also has a cataract, and did the surgery. In the following years she had an episode when she could not feel cold/warm on one side of her body. She got better but not completely. She further had an episode of right sided weakness and received steroids and recovered. 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 She was initially treated with Copaxone; however, discontinued in 2011 (per notes by Dr. Hunt Oris).   Started with Aubagio at the end of June 2018. Tolerates it well. She underwent partial hysterectomy.     Interval history: Complains of Victoria Kelly, melatonin does not help. She complains of bladder Kelly, anxiety. Takes Paxil 60 mg in the evening. Paxil does not help with the Kelly, but helps with her anxiety. Does not get  out of the house much. Complains of Victoria Kelly below the groin area.   She states that she previously took 900mg /day of Neurontin in 2014 and it did nothing for her.   Also  tried Cymbalta in the past, but was vomiting and had metal taste and could not tolerate it.     Please see below the results of the diagnostic work-up performed so far.   ............................................................................................................................................Marland Kitchen  DIAGNOSTIC STUDIES / REVIEW OF RECORDS:    Prior medical records: [name]    MRI:  10/26/2016: Brain MRI with and w/o contrast,report (COMPARISON: Brain MR 11/30/2014 and prior): Multiple white matter lesions in a distribution consistent with multiple sclerosis. No new or enhancing lesions.  10/26/2016: C-spine  MRI with and w/o contrast,report (COMPARISON: MRI cervical spine 12/21/2011): Stable T2 hyperintense lesion in the cord at C3. No new lesions.    Lumbar puncture:   Per notes by Dr. Alessandra Bevels on 04/18/2007 'she had a negative lumbar puncture' but I have no results available for my review.     Blood tests:    Office Visit on 07/06/2017   Component Date Value Ref Range Status   ??? Sodium 07/06/2017 142  135 - 145 mmol/L Final   ??? Potassium 07/06/2017 4.6  3.5 - 5.0 mmol/L Final   ??? Chloride 07/06/2017 107  98 - 107 mmol/L Final   ??? CO2 07/06/2017 27.0  22.0 - 30.0 mmol/L Final   ??? BUN 07/06/2017 13  7 - 21 mg/dL Final   ??? Creatinine 07/06/2017 1.30* 0.60 - 1.00 mg/dL Final   ??? BUN/Creatinine Ratio 07/06/2017 10   Final   ??? EGFR MDRD Non Af Amer 07/06/2017 44* >=60 mL/min/1.33m2 Final   ??? EGFR MDRD Af Amer 07/06/2017 53* >=60 mL/min/1.71m2 Final   ??? Anion Gap 07/06/2017 8* 9 - 15 mmol/L Final   ??? Glucose 07/06/2017 93  65 - 99 mg/dL Final   ??? Calcium 16/07/9603 8.5  8.5 - 10.2 mg/dL Final   ??? Albumin 54/06/8118 3.1* 3.5 - 5.0 g/dL Final   ??? Total Protein 07/06/2017 5.9* 6.5 - 8.3 g/dL Final   ??? Total Bilirubin 07/06/2017  0.3  0.0 - 1.2 mg/dL Final   ??? AST 81/19/1478 18  14 - 38 U/L Final   ??? ALT 07/06/2017 28  15 - 48 U/L Final   ??? Alkaline Phosphatase 07/06/2017 81  38 - 126 U/L Final   ??? WBC 07/06/2017 5.8  4.5 - 11.0 10*9/L Final   ??? RBC 07/06/2017 4.45  4.00 - 5.20 10*12/L Final   ??? HGB 07/06/2017 13.4  12.0 - 16.0 g/dL Final   ??? HCT 29/56/2130 40.8  36.0 - 46.0 % Final   ??? MCV 07/06/2017 91.8  80.0 - 100.0 fL Final   ??? MCH 07/06/2017 30.2  26.0 - 34.0 pg Final   ??? MCHC 07/06/2017 32.9  31.0 - 37.0 g/dL Final   ??? RDW 86/57/8469 14.0  12.0 - 15.0 % Final   ??? MPV 07/06/2017 8.2  7.0 - 10.0 fL Final   ??? Platelet 07/06/2017 259  150 - 440 10*9/L Final   ??? Absolute Neutrophils 07/06/2017 3.4  2.0 - 7.5 10*9/L Final   ??? Absolute Lymphocytes 07/06/2017 1.6  1.5 - 5.0 10*9/L Final   ??? Absolute Monocytes 07/06/2017 0.3  0.2 - 0.8 10*9/L Final   ??? Absolute Eosinophils 07/06/2017 0.2  0.0 - 0.4 10*9/L Final   ??? Absolute Basophils 07/06/2017 0.1  0.0 - 0.1 10*9/L Final   ??? Large Unstained Cells 07/06/2017 4  0 - 4 % Final   ??? Hypochromasia 07/06/2017 Slight* Not Present Final   Office Visit on 03/28/2017   Component Date Value Ref Range Status   ??? Sodium 03/28/2017 141  135 - 145 mmol/L Final   ??? Potassium 03/28/2017 4.2  3.5 - 5.0 mmol/L Final   ??? Chloride 03/28/2017 100  98 - 107 mmol/L Final   ??? CO2 03/28/2017 26.0  22.0 - 30.0 mmol/L Final   ??? BUN 03/28/2017 14  7 - 21 mg/dL Final   ??? Creatinine 03/28/2017 1.25* 0.60 - 1.00 mg/dL Final   ??? BUN/Creatinine Ratio 03/28/2017 11   Final   ??? EGFR MDRD Non Af Amer 03/28/2017 46* >=60 mL/min/1.34m2 Final   ??? EGFR MDRD Af Amer 03/28/2017 56* >=60 mL/min/1.17m2 Final   ??? Anion Gap 03/28/2017 15  9 - 15 mmol/L Final   ??? Glucose 03/28/2017 84  65 - 179 mg/dL Final   ??? Calcium 62/95/2841 8.6  8.5 - 10.2 mg/dL Final   ??? Albumin 32/44/0102 3.2* 3.5 - 5.0 g/dL Final   ??? Total Protein 03/28/2017 6.1* 6.5 - 8.3 g/dL Final   ??? Total Bilirubin 03/28/2017 0.2  0.0 - 1.2 mg/dL Final   ??? AST 72/53/6644 14  14 - 38 U/L Final   ??? ALT 03/28/2017 20  15 - 48 U/L Final   ??? Alkaline Phosphatase 03/28/2017 67  38 - 126 U/L Final   ??? NMO AQP4 IgG, Serum 03/28/2017 Negative  Negative Final   ??? MOG-IgG 03/28/2017 negative  Negative Final   ??? Antinuclear Antibodies (ANA) 03/28/2017 Positive* Negative Final   ??? Pattern 1 (ANA) 03/28/2017 Speckled   Final   ??? ANA Titer 1 03/28/2017 1:80   Final   ??? ENA Screen 03/28/2017 0.10  <0.70 ENA Units Final   ??? Rheumatoid Factor 03/28/2017 <8.6  0.0 - 15.0 IU/mL Final   ??? Angio Convert Enzyme 03/28/2017 39  8 - 53 U/L Final   ??? Quanterferon TB Gold 03/28/2017 Negative  Negative Final   ??? Quanterferon NIL Value 03/28/2017 0.12  IU/mL Final   ??? Quantiferon  Mitogen Minus Nil 03/28/2017 >10.00  IU/mL Final   ??? Quantiferon Antigen Minus Nil 03/28/2017 -0.04  IU/mL Final   ??? WBC 03/28/2017 5.7  4.5 - 11.0 10*9/L Final   ??? RBC 03/28/2017 4.59  4.00 - 5.20 10*12/L Final   ??? HGB 03/28/2017 13.3  12.0 - 16.0 g/dL Final   ??? HCT 16/07/9603 41.6  36.0 - 46.0 % Final   ??? MCV 03/28/2017 90.6  80.0 - 100.0 fL Final   ??? MCH 03/28/2017 29.1  26.0 - 34.0 pg Final   ??? MCHC 03/28/2017 32.1  31.0 - 37.0 g/dL Final   ??? RDW 54/06/8118 13.2  12.0 - 15.0 % Final   ??? MPV 03/28/2017 7.9  7.0 - 10.0 fL Final   ??? Platelet 03/28/2017 264  150 - 440 10*9/L Final   ??? Absolute Neutrophils 03/28/2017 2.9  2.0 - 7.5 10*9/L Final   ??? Absolute Lymphocytes 03/28/2017 2.1  1.5 - 5.0 10*9/L Final   ??? Absolute Monocytes 03/28/2017 0.2  0.2 - 0.8 10*9/L Final   ??? Absolute Eosinophils 03/28/2017 0.2  0.0 - 0.4 10*9/L Final   ??? Absolute Basophils 03/28/2017 0.1  0.0 - 0.1 10*9/L Final   ??? Large Unstained Cells 03/28/2017 3  0 - 4 % Final   ??? Hypochromasia 03/28/2017 Slight* Not Present Final   Lab on 10/29/2016   Component Date Value Ref Range Status   ??? Vitamin D Total (25OH) 10/29/2016 8.0* 20.0 - 80.0 ng/mL Final   ??? Sodium 10/29/2016 140  135 - 145 mmol/L Final   ??? Potassium 10/29/2016 3.9  3.5 - 5.0 mmol/L Final   ??? Chloride 10/29/2016 103  98 - 107 mmol/L Final   ??? CO2 10/29/2016 29.0  22.0 - 30.0 mmol/L Final   ??? BUN 10/29/2016 10  7 - 21 mg/dL Final   ??? Creatinine 10/29/2016 1.13* 0.60 - 1.00 mg/dL Final   ??? BUN/Creatinine Ratio 10/29/2016 9   Final   ??? EGFR MDRD Non Af Amer 10/29/2016 52* >=60 mL/min/1.53m2 Final   ??? EGFR MDRD Af Amer 10/29/2016 >=60  >=60 mL/min/1.29m2 Final   ??? Anion Gap 10/29/2016 8* 9 - 15 mmol/L Final   ??? Glucose 10/29/2016 72  65 - 179 mg/dL Final   ??? Calcium 14/78/2956 8.6  8.5 - 10.2 mg/dL Final   ??? Iron 21/30/8657 24* 35 - 165 ug/dL Final   ??? TIBC 84/69/6295 279.2  252.0 - 479.0 mg/dL Final   ??? Transferrin 10/29/2016 221.6  200.0 - 380.0 mg/dL Final   ??? Iron Saturation (%) 10/29/2016 9* 15 - 50 % Final   ??? Ferritin 10/29/2016 60.5  3.0 - 151.0 ng/mL Final   ??? Copper 10/29/2016 1.23  0.75 - 1.45 mcg/mL Final   ??? Zinc 10/29/2016 0.44* 0.66 - 1.10 mcg/mL Final   ??? VITAMIN B1 10/29/2016 148  70 - 180 nmol/L Final   ??? Folate 10/29/2016 4.9  2.7 - 20.0 ng/mL Final   ??? Hemoglobin A1C 10/29/2016 4.9  4.8 - 6.0 % Final   ??? Estimated Average Glucose 10/29/2016 94  mg/dL Final   ??? WBC 28/41/3244 4.7  4.5 - 11.0 10*9/L Final   ??? RBC 10/29/2016 4.67  4.00 - 5.20 10*12/L Final   ??? HGB 10/29/2016 13.7  12.0 - 16.0 g/dL Final   ??? HCT 10/20/7251 42.2  36.0 - 46.0 % Final   ??? MCV 10/29/2016 90.4  80.0 - 100.0 fL Final   ??? MCH 10/29/2016 29.3  26.0 - 34.0  pg Final   ??? MCHC 10/29/2016 32.4  31.0 - 37.0 g/dL Final   ??? RDW 16/07/9603 13.8  12.0 - 15.0 % Final   ??? MPV 10/29/2016 8.0  7.0 - 10.0 fL Final   ??? Platelet 10/29/2016 236  150 - 440 10*9/L Final   ??? Absolute Neutrophils 10/29/2016 3.4  2.0 - 7.5 10*9/L Final   ??? Absolute Lymphocytes 10/29/2016 0.7* 1.5 - 5.0 10*9/L Final   ??? Absolute Monocytes 10/29/2016 0.4  0.2 - 0.8 10*9/L Final   ??? Absolute Eosinophils 10/29/2016 0.1  0.0 - 0.4 10*9/L Final   ??? Absolute Basophils 10/29/2016 0.0  0.0 - 0.1 10*9/L Final   ??? Large Unstained Cells 10/29/2016 3  0 - 4 % Final   ??? Hypochromasia 10/29/2016 Slight* Not Present Final Office Visit on 09/27/2016   Component Date Value Ref Range Status   ??? VIT D2 (25OH) 09/27/2016 <5  ng/mL Final   ??? VIT D3 (25OH) 09/27/2016 <5  ng/mL Final   ??? Vitamin D Total (25OH) 09/27/2016 <5* 20 - 80 ng/mL Final   ??? Albumin 09/27/2016 3.4* 3.5 - 5.0 g/dL Final   ??? Total Protein 09/27/2016 6.9  6.6 - 8.0 g/dL Final   ??? Total Bilirubin 09/27/2016 0.6  0.0 - 1.2 mg/dL Final   ??? Bilirubin, Direct 09/27/2016 0.40  0.00 - 0.40 mg/dL Final   ??? AST 54/06/8118 15  14 - 38 U/L Final   ??? ALT 09/27/2016 26  15 - 48 U/L Final   ??? Alkaline Phosphatase 09/27/2016 75  38 - 126 U/L Final   ??? WBC 09/27/2016 5.3  4.5 - 11.0 10*9/L Final   ??? RBC 09/27/2016 4.17  4.00 - 5.20 10*12/L Final   ??? HGB 09/27/2016 12.6  12.0 - 16.0 g/dL Final   ??? HCT 14/78/2956 37.0  36.0 - 46.0 % Final   ??? MCV 09/27/2016 88.7  80.0 - 100.0 fL Final   ??? MCH 09/27/2016 30.2  26.0 - 34.0 pg Final   ??? MCHC 09/27/2016 34.1  31.0 - 37.0 g/dL Final   ??? RDW 21/30/8657 13.6  12.0 - 15.0 % Final   ??? MPV 09/27/2016 7.7  7.0 - 10.0 fL Final   ??? Platelet 09/27/2016 268  150 - 440 10*9/L Final   ??? Absolute Neutrophils 09/27/2016 3.0  2.0 - 7.5 10*9/L Final   ??? Absolute Lymphocytes 09/27/2016 1.6  1.5 - 5.0 10*9/L Final   ??? Absolute Monocytes 09/27/2016 0.3  0.2 - 0.8 10*9/L Final   ??? Absolute Eosinophils 09/27/2016 0.2  0.0 - 0.4 10*9/L Final   ??? Absolute Basophils 09/27/2016 0.0  0.0 - 0.1 10*9/L Final   ??? Large Unstained Cells 09/27/2016 3  0 - 4 % Final   ??? Hypochromasia 09/27/2016 Slight* Not Present Final     Other:   .............................................................................................................................................    Past Medical History:  Past Medical History:   Diagnosis Date   ??? Allergic rhinitis, cause unspecified 04/11/2013   ??? Dysthymic disorder 02/10/2012   ??? Generalized anxiety disorder 02/10/2012   ??? H/O bariatric surgery    ??? High blood cholesterol level 05/31/2013   ??? Hx of trichomonal vaginitis 02/2006    On Pap smear   ??? Victoria Kelly 05/23/2013    Getting ambien through psych     ??? Iron deficiency anemia 05/24/2013   ??? Morbid obesity with BMI of 50.0-59.9, adult (CMS-HCC) 11/26/2011   ??? MS (multiple sclerosis) (CMS-HCC) 11/23/2001   ??? Obstructive sleep apnea 11/01/2011  uses cpap        ALLERGIES:    Allergies   Allergen Reactions   ??? Cymbalta [Duloxetine] Other (See Comments)     Metal taste in mouth     ??? Keflex [Cephalexin] Other (See Comments)     rash     Current Outpatient Prescriptions   Medication Sig Dispense Refill   ??? acetaminophen (TYLENOL) 500 MG tablet Take 2 tablets (1,000 mg total) by mouth every eight (8) hours as needed for Kelly. 30 tablet 0   ??? fluticasone (FLONASE) 50 mcg/actuation nasal spray 2 sprays by Each Nare route daily. 16 g 6   ??? multivitamin (TAB-A-VITE/THERAGRAN) per tablet Take 1 tablet by mouth daily. 90 tablet 3   ??? pantoprazole (PROTONIX) 40 MG tablet Take 1 tablet (40 mg total) by mouth daily. 90 tablet 3   ??? PARoxetine (PAXIL) 30 MG tablet START TAKING 1 PILL A DAY, CAN INCREASE TO 2 PILLS A DAY AFTER 2-3 WEEKS. 180 tablet 1   ??? teriflunomide (AUBAGIO) 14 mg Tab Take 14 mg by mouth daily. MUST come to appointment for future refills. 30 tablet 6   ??? traZODone (DESYREL) 50 MG tablet Take 1 tablet (50 mg total) by mouth nightly. 30 tablet 0   ??? cholecalciferol, vitamin D3, 4,000 unit Tab Take 4,000 Units by mouth daily. (Patient not taking: Reported on 07/06/2017) 30 tablet 11   ??? ferrous sulfate 325 (65 FE) MG tablet Take 1 tablet (325 mg total) by mouth daily with breakfast. (Patient not taking: Reported on 01/16/2018) 90 tablet 3   ??? oxybutynin (DITROPAN-XL) 5 MG 24 hr tablet Take 1 tablet (5 mg total) by mouth daily. In the morning. 30 tablet 5   ??? pregabalin (LYRICA) 75 MG capsule Take 1 capsule (75 mg total) by mouth Two (2) times a day. On Day 1 take 1 pill at bedtime. From Day 2 continue 1 pill two times a day. 60 capsule 2     No current facility-administered medications for this visit. Past Surgical History:    Past Surgical History:   Procedure Laterality Date   ??? ABDOMINAL HYSTERECTOMY  05/05/2006    Ovaries were kept inside   ??? BARIATRIC SURGERY  2014   ??? CATARACT EXTRACTION Bilateral 08/14/2003, 09/09/2003   ??? CESAREAN SECTION  01/12/2000   ??? DEBRIDEMENT LEG Victoria 09/30/1999    Spider bite   ??? ENDOMETRIAL ABLATION  02/2005   ??? ENDOMETRIAL ABLATION  02/11/2005   ??? OOPHORECTOMY     ??? PR LAP, GAST RESTRICT PROC, LONGITUDINAL GASTRECTOMY Midline 02/15/2014    Procedure: ROBOTIC PR LAP, GAST RESTRICT PROC, LONGITUDINAL GASTRECTOMY;  Surgeon: Aida Raider, MD;  Location: MAIN OR [place];  Service: Gastrointestinal   ??? TONSILLECTOMY  1982   ??? TOTAL ABDOMINAL HYSTERECTOMY     ??? TUBAL LIGATION  01/12/2000       Social History:    Social History     Social History   ??? Marital status: Divorced     Spouse name: N/A   ??? Number of children: 3   ??? Years of education: N/A     Occupational History   ??? Disabiled Unknown     Social History Main Topics   ??? Smoking status: Never Smoker   ??? Smokeless tobacco: Never Used   ??? Alcohol use No   ??? Drug use: No   ??? Sexual activity: Not Currently     Other Topics Concern   ??? Do You Use Sunscreen?  No   ??? Tanning Bed Use? No   ??? Are You Easily Burned? No   ??? Excessive Sun Exposure? No     Social History Narrative    Single, has 3 children, aged 63-25. Married in 1997, divorced in 2001. Lives with her boyfriend. On disability (r/t Multiple Sclerosis), previously employed as a Interior and spatial designer, bus Hospital doctor. Born and raised in [place]. Attended [employer] in 1993.    Attended [employer] and [employer], some college credits attained.           Family History:    Family History   Problem Relation Age of Onset   ??? Breast cancer Cousin    ??? Cervical cancer Cousin    ??? Brain cancer Father         Brain tumor   ??? Hyperlipidemia Father    ??? Lupus Mother    ??? Hypertension Mother    ??? Fibromyalgia Mother    ??? Heart disease Mother         aortic valve replacement   ??? Cataracts Mother    ??? Leukemia Maternal Aunt    ??? Lupus Maternal Aunt    ??? Lupus Maternal Aunt         Review of Systems:  A 10-systems review was performed and, unless otherwise noted, declared negative by patient.    Objective:     Physical Exam:  Blood pressure 139/63, pulse 55, height 170.2 cm (5' 7), weight (!) 129.3 kg (285 lb), not currently breastfeeding.   General Appearance: in no acute distress. Normal skin color, afebrile.  Heart and lungs: Regular heart rate. Eupneic, normal respiratory rate. Abdomen: Soft, non-tender. No peripheral  edema, peripheral pulses palpable.       NEUROLOGICAL EXAMINATION:     General:  Alert and oriented to person, place, time and situation.    Recent and remote memory intact.        Cranial Nerves:     II, III- Pupils are equal and reactive to light b/l (direct and consensual reactions). Visual Acuity: 20/100 b/l. Decrease in color Kelly b/l.  No visual field defect.   III, IV, VI- extra ocular movements are intact, No ptosis, no diplopia, no nystagmus.  V- sensation of the face intact b/l.   VII- face symmetrical, no facial droop, normal facial movements with smile/grimace  VIII- Hearing grossly intact. Weber test: sound is symmetrical with no lateralization.  IX and X- symmetric palate contraction, normal gag bilaterally, no dysarthria, no dysphagia.  XI- Full shoulder shrug bilaterally; no wasting, normal tone and strength of sternocleidomastoid muscles bilaterally.  XII- No tongue atrophy, no tongue fasciculations; tongue protrudes midline, full range of movements of the tongue.    Neck flexion normal.    Motor Exam:     Normal bulk. Fasciculations not observed.     Muscle strength:    Muscles UEs  LEs    R L  R L   Deltoids 5/5 5/5 Hip flexors  5/5 5/5   Biceps 5/5 5/5 Hip extensors 5/5 5/5   Triceps 5/5 5/5 Knee flexors 5/5 5/5   Hand grip 5/5 5/5 Knee extensors 5/5 5/5   Wrist flexors 5/5 5/5 Foot dorsal flexors 5/5 5/5   Wrist extensors 5/5 5/5 Foot plantar flexors 5/5 5/5   Finger flexors 5/5 5/5      Finger extensors 5/5 5/5             Reflexes R L   Biceps +2 +  2   Brachioradialis  +2 +2   Triceps +2 +2   Patella +2 +2   Achilles +2 +2     Normal tone b/l. Negative Babinski sign bilaterally.    Sensory system:  ? Superficial light touch sensation:WNL  ? Vibration sense: mild decrease in vibrations in all 4 limbs  ? Position sense:WNL  ? Pinprick test for Kelly sensation:WNL  (WNL= within normal limits; UE= upper extremities; LE= lower extremities;  R= right, L= Victoria).    Cerebellar/Coordination:  Rapid alternating movements, finger-to-nose and heel-to-shin  bilaterally demonstrate no abnormalities. No limb ataxia bilaterally. No gait ataxia. Romberg negative. Performs a tandem gait walk.    Gait: Normal stride, base and  armswing. Able to walk on toes, heels without difficulty. Gait influenced by obesity.    Tests for meningeal irritation: negative.    Other tests/signs: SLRT negative b/l.       .........................................................................................................................................Marland Kitchen    VISIT SUMMARY:  Ms. MARICA TRENTHAM, a 47 y.o. African American right handed female  presented with MS.  Ms. EMILIANA BLAIZE is instructed to schedule a follow-up with Yolande Jolly in 6 months, or earlier, if needed.   Ms. AMBRA HAVERSTICK voiced a complete understanding of the diagnostic and treatment plan as detailed above. All questions were answered.   Start of Visit Time: 14:08h  End of Visit Time: 14:57h  Total visit time =  49 minutes    Greater than 50% of the face to face time was spent in consultation on the  disease process, and treatment planning, medication, dosing and side effects.     Thank you for the opportunity to contribute to the care of Ms. Nyeli L Soden.

## 2018-01-17 LAB — VITAMIN D, TOTAL (25OH): Lab: 4.6 — ABNORMAL LOW

## 2018-01-17 MED ORDER — TERIFLUNOMIDE 14 MG TABLET: each | 6 refills | 0 days

## 2018-01-17 MED ORDER — TRAZODONE 50 MG TABLET
ORAL_TABLET | Freq: Every evening | ORAL | 5 refills | 0 days | Status: CP
Start: 2018-01-17 — End: 2018-02-18

## 2018-01-17 MED ORDER — ERGOCALCIFEROL (VITAMIN D2) 1,250 MCG (50,000 UNIT) CAPSULE
ORAL_CAPSULE | ORAL | 0 refills | 0.00000 days | Status: CP
Start: 2018-01-17 — End: ?

## 2018-01-17 MED ORDER — TERIFLUNOMIDE 14 MG TABLET
Freq: Every day | ORAL | 6 refills | 0.00000 days | Status: CP
Start: 2018-01-17 — End: 2018-08-01

## 2018-01-17 NOTE — Unmapped (Signed)
I reviewed with the resident the medical history and the resident???s findings on physical examination.  I discussed with the resident the patient???s diagnosis and concur with the treatment plan as documented in the resident note. Danielle Dess, MD

## 2018-01-18 NOTE — Unmapped (Signed)
Notified the patient that Dr. Johnnye Lana wants for her to pick up a prescription for Vitamin D tablets, 50000 units by mouth per week and to start taking them as prescribed. Confirmed pharmacy preference. Patient verbalized her understanding.

## 2018-01-18 NOTE — Unmapped (Signed)
-----   Message from Sarita Bottom, MD sent at 01/17/2018  5:07 PM EDT -----  Please call the patient to start taking Vitamin D 50.000 units/week.     Rx for Vitamin D sent to:     [id number] - [place], [place] - [address] Phone:  [phone] Fax:  [phone]   Address:  [address], [place] [address]       Thanks,  Sharmon Revere

## 2018-01-18 NOTE — Unmapped (Signed)
Vitamin D is low.   Rx Take Vitamin D 50.000 IU/week until the next follow-up.     Sharmon Revere Dujmovic Basuroski

## 2018-01-22 NOTE — Unmapped (Signed)
No answer

## 2018-01-30 NOTE — Unmapped (Signed)
[facility] Refill Coordination Note  Medication: AUBAGIO    Unable to reach patient to schedule shipment for medication being filled at [facility]. PATIENT DECLINED TO SCHEDULE DELIVERY AS SHE IS UNABLE TO MAKE A PAYMENT ON HER ACCT. PT REPORTS SHE WILL CALL BACK 4/16..  As this is the 3rd unsuccessful attempt to reach the patient, no additional phone call attempts will be made at this time.      Phone numbers attempted: [phone]  Last scheduled delivery: SHIPPED 11/29/17    Please call the [facility] at [phone] (option 4) should you have any further questions.      Thanks,  [facility] Specialty Team

## 2018-02-06 NOTE — Unmapped (Signed)
Only option I know would be charity care but that would not cover her copay's with pharmacy.

## 2018-02-14 NOTE — Unmapped (Signed)
Informed patient of lab work, notably vitamin D remains low. She was prescribed 50,000 IU q week but has yet to pick it up. She will take 4000 IU daily until she picks up her new Rx. Continue iron supplementation, can take QOD if issues with tolerance daily.

## 2018-02-17 NOTE — Unmapped (Signed)
[facility] in [place] is requesting 90 day supply of trazadone 50mg  tablets, #90, take one tablet by mouth every day at night

## 2018-02-18 MED ORDER — TRAZODONE 50 MG TABLET
ORAL_TABLET | Freq: Every evening | ORAL | 1 refills | 0.00000 days | Status: CP
Start: 2018-02-18 — End: 2018-08-01

## 2018-02-27 MED FILL — AUBAGIO/14MG/TABS: AUBAGIO/14MG/TABS | 28 days supply | Qty: 28 | Fill #0

## 2018-02-27 NOTE — Unmapped (Signed)
i

## 2018-02-27 NOTE — Unmapped (Signed)
[facility] Refill Coordination Note  Specialty Medication(s): AUBAGIO [address] [place], DOB: 1971-09-19  Phone: [phone] (home) , Alternate phone contact: N/A  Phone or address changes today?: No  All above HIPAA information was verified with patient.  Shipping Address: [address]  [place] [address]   Insurance changes? No    Completed refill call assessment today to schedule patient's medication shipment from the [facility] [phone]).      Confirmed the medication and dosage are correct and have not changed: Yes, regimen is correct and unchanged.    Confirmed patient started or stopped the following medications in the past month:  No, there are no changes reported at this time.    Are you tolerating your medication?:  Victoria Kelly reports tolerating the medication.    ADHERENCE    (Below is required for Medicare Part B or Transplant patients only - per drug):   How many tablets were dispensed last month: 28  Patient currently has ABOUT 7 DAYS remaining.    Did you miss any doses in the past 4 weeks? No missed doses reported.    FINANCIAL/SHIPPING    Delivery Scheduled: Yes, Expected medication delivery date: 5/15 [facility]IA [facility] [place]     The patient will receive an FSI print out for each medication shipped and additional FDA Medication Guides as required.  Patient education from [place] or [name] may also be included in the shipment    Victoria Kelly did not have any additional questions at this time.    Delivery address validated in FSI scheduling system: Yes, address listed in FSI is correct.    We will follow up with patient monthly for standard refill processing and delivery.      Thank you,  Westley Gambles   [facility] Specialty Technician

## 2018-03-01 ENCOUNTER — Encounter: Admit: 2018-03-01 | Discharge: 2018-03-01 | Payer: MEDICARE

## 2018-03-01 DIAGNOSIS — G35 Multiple sclerosis: Principal | ICD-10-CM

## 2018-03-06 ENCOUNTER — Encounter: Admit: 2018-03-06 | Discharge: 2018-03-06 | Payer: MEDICARE

## 2018-03-06 ENCOUNTER — Ambulatory Visit: Admit: 2018-03-06 | Discharge: 2018-03-06 | Payer: MEDICARE

## 2018-03-06 DIAGNOSIS — L309 Dermatitis, unspecified: Principal | ICD-10-CM

## 2018-03-06 DIAGNOSIS — R21 Rash and other nonspecific skin eruption: Secondary | ICD-10-CM

## 2018-03-06 DIAGNOSIS — G35 Multiple sclerosis: Principal | ICD-10-CM

## 2018-03-06 DIAGNOSIS — Z9884 Bariatric surgery status: Secondary | ICD-10-CM

## 2018-03-06 MED ORDER — TRIAMCINOLONE ACETONIDE 0.1 % TOPICAL CREAM
Freq: Two times a day (BID) | TOPICAL | 0 refills | 0 days | Status: CP
Start: 2018-03-06 — End: 2019-02-05

## 2018-03-06 NOTE — Unmapped (Signed)
Start the steroid cream twice a day to the affected areas, try to avoid scratching the lesion. I have referred you to Dermatology, if the rash resolves with the steroid cream you can cancel your appointment with them.

## 2018-03-06 NOTE — Unmapped (Signed)
Internal Medicine Clinic Visit    HPI:Victoria Kelly is a 47 year old female with a history of multiple sclerosis, gastric bypass, depression, GERD who presents for a new rash.  She reports pruritic raised bumps on her left arm that appeared a month ago and has now spread to her bilateral lower legs.  She has tried antifungal cream, anti-itch cream, and over-the-counter steroid cream with no improvement.  She denies any new medications prescribed or over-the-counter, new lotions, new fabric softener, new clothing, insect bites, or family members with similar rashes.  She believes that the rash is getting worse.  Denies fever, chills, nausea, vomiting, constipation, diarrhea.  She otherwise feels fine.    __________________________________________________________    Problem List:  Patient Active Problem List   Diagnosis   ??? Multiple sclerosis (CMS-HCC)   ??? Dysthymic disorder   ??? Obesity   ??? Obstructive sleep apnea   ??? Weakness of wrist   ??? Ankle arthritis   ??? Allergic rhinitis   ??? Insomnia   ??? Low back pain   ??? Iron deficiency anemia   ??? Acne   ??? H/O cataract removal with insertion of prosthetic lens   ??? Hyperlipidemia   ??? History of bariatric surgery   ??? Elevated serum creatinine   ??? Morbid (severe) obesity due to excess calories (CMS-HCC)   ??? S/P TAH (total abdominal hysterectomy)   ??? Rash       Medications:  Reviewed in EPIC  __________________________________________________________    Physical Exam:   Vital Signs:  Vitals:    03/06/18 1524   BP: 109/59   Pulse: 59   SpO2: 96%   Weight: (!) 130.1 kg (286 lb 12.8 oz)   Height: 170.2 cm (5' 7)       Gen: Well appearing, NAD  Skin: small <1cm papules with central excoriation located on bilateral anterior lower legs, dorsum of the feet, left forearms, few lesions on lower back.       Left arm      Right foot    ____________________________________________________________    Assessment & Plan: 70F with MS, Vit D def, and s/p gastric bypass presents for rash Dermatitis, NOS: Unclear etiology to her rash, no obvious exposures, she does have care for young kids (48 month old, 61 year old) but rash not typical for hand foot and mouth, no central umbilication to suggest molluscum. No prior history of similar rash. Will trial higher potency steroid and refer to Dermatology in [place].   - Triamcinolone 0.1% cream   - Amb Ref Dermatology    S/p Gastric Bypass:   - Copper, Zinc, Vit B1 not completed at last visit, obtain today. Unlikely to be causing localized rash.  - Continue MVM, iron supplement, vitamin D    The patient was discussed with attending, Dr. Criss Alvine.

## 2018-03-07 NOTE — Unmapped (Signed)
Addended by: Criss Alvine, Vennesa Bastedo L on: 03/07/2018 09:20 AM     Modules accepted: Level of Service

## 2018-03-08 LAB — ZINC: Zinc:MCnc:Pt:Ser/Plas:Qn:: 0.5 — ABNORMAL LOW

## 2018-03-08 LAB — COPPER: Copper:MCnc:Pt:Ser/Plas:Qn:: 1.47 — ABNORMAL HIGH

## 2018-03-09 LAB — VITAMIN B1: Thiamine:SCnc:Pt:Bld:Qn:: 122

## 2018-03-10 MED ORDER — ZINC SULFATE 220 MG (50 MG) CAPSULE
ORAL_CAPSULE | Freq: Every day | ORAL | 3 refills | 0.00000 days | Status: CP
Start: 2018-03-10 — End: 2019-03-10

## 2018-03-10 NOTE — Unmapped (Signed)
Lab work shows zinc remains low, will start zinc sulfate 220 mg capsule daily.

## 2018-03-20 NOTE — Unmapped (Signed)
[facility] Special[facility] Pharm[facility] Refill Coordination Note  Specialty Medication(s): Aubagio 14mg   Additional Medications shipped: none    Victoria Kelly, DOB: Jul 09, 1971  Phone: [phone] (home) , Alternate phone contact: N/A  Phone or address changes today?: No  All above HIPAA information was verified with patient.  Shipping Address: [address]  [place] [address]   Insurance changes? No    Completed refill call assessment today to schedule patient's medication shipment from the [facility] [phone]).      Confirmed the medication and dosage are correct and have not changed: Yes, regimen is correct and unchanged.    Confirmed patient started or stopped the following medications in the past month:  Yes. Victoria Kelly reports starting the following medications: oxybutynin 5mg , vitamin D    Are you tolerating your medication?:  Victoria Kelly reports tolerating the medication.    ADHERENCE    (Below is required for Medicare Part B or Transplant patients only - per drug):   How many tablets were dispensed last month: 28  Patient currently has 7 remaining.    Did you miss any doses in the past 4 weeks? No missed doses reported.    FINANCIAL/SHIPPING    Delivery Scheduled: Yes, Expected medication delivery date: 03/24/2018     The patient will receive an FSI print out for each medication shipped and additional FDA Medication Guides as required.  Patient education from [place] or [name] may also be included in the shipment    Victoria Kelly did not have any additional questions at this time.    Delivery address validated in FSI scheduling system: Yes, address listed in FSI is correct.    We will follow up with patient monthly for standard refill processing and delivery.      Thank you,  Breck Coons Shared [facility] Specialty Pharmacist

## 2018-03-23 MED FILL — AUBAGIO/14MG/TABS: AUBAGIO/14MG/TABS | 28 days supply | Qty: 28 | Fill #1

## 2018-03-31 NOTE — Unmapped (Signed)
Specialty Pharmacy - Neurology Medication Clinical Assessment       Victoria Kelly is a 47 y.o. female contacted today regarding her specialty medication(s) teriflunomide (AUBAGIO)    Verified patient's date of birth / HIPAA.    Medications reviewed and verified with patient: Allergies - Medications -      Specialty medication(s) and dose(s) confirmed: yes  Changes to medications: yes  Changes to insurance: no     Is therapy still appropriate given the disease, patient response, and medical condition? yes  -Last LFTs 01/16/18 WNL  Is therapy still effective? yes    Medication Adherence    Patient reported X missed doses in the last month:  0  Patient is on additional specialty medications:  No  Patient is on more than two specialty medications:  No  Informant:  patient  Reliability of informant:  reliable  Provider-estimated medication adherence level:  90-100%  Patient is at risk for Non-Adherence:  No  Reasons for non-adherence:  no problems identified   Other adherence tool:  Patient has a bag of medicines that she takes before bed            Adverse Effects    Sleep disturbance:  Pos  - The patient expressed concerns that she has trouble sleeping at night. I personally reviewed her medications with her and did not expect any of them to cause insomnia except possibly her paroxetine. She confirmed with me that she takes her paroxetine in the morning. She stated that she takes melatonin 5 mg nightly but it isn't working well for her. She stated that she has taken zolpidem as well as clonazepam in the past and those have both worked for her/helped her sleep.     Drug Interactions    Drug interactions evaluated:  yes  Clinically relevant drug interactions identified:  no  Provided the patient with educational material regarding drug interactions:  not applicable       Patient Counseling    Counseled the patient on the following:  possible adverse effects and management discussed, therapeutic rationale discussed The patient will receive an FSI print out for each medication shipped and additional FDA Medication Guides as required. Patient education from [place] or [name] may also be included in the shipment.     Entered by Herminio Heads, PharmD Candidate, acting as scribe for [name], CPP. Signature: Herminio Heads.  March 31, 2018 3:17 PM    The documentation recorded by the scribe accurately reflects the service I personally performed and the decisions made by me. Signature: [name], CPP. March 31, 2018 4:10 PM.        [name], PharmD, CPP  Clinical Pharmacist, [facility]  Phone: [phone]

## 2018-04-12 NOTE — Unmapped (Signed)
[facility] Refill Coordination Note    Specialty Medication(s) to be Shipped:   Neurology: Victoria Kelly    Other medication(s) to be shipped:       Victoria Kelly, DOB: 1971/05/10  Phone: [phone] (home)   Shipping Address: [address]  [place] [address]    All above HIPAA information was verified with patient's family member.     Completed refill call assessment today to schedule patient's medication shipment from the [facility] [phone]).       Specialty medication(s) and dose(s) confirmed: Regimen is correct and unchanged.   Changes to medications: Arnika reports no changes reported at this time.  Changes to insurance: No  Questions for the pharmacist: No    The patient will receive an FSI print out for each medication shipped and additional FDA Medication Guides as required.  Patient education from [employer] or [name] may also be included in the shipment.    DISEASE-SPECIFIC INFORMATION        N/A    ADHERENCE     Medication Adherence     Other adherence tool:  Patient has a bag of medicines that she takes before bed               MEDICARE PART B DOCUMENTATION         SHIPPING     Shipping address confirmed in FSI.     Delivery Scheduled: Yes, Expected medication delivery date: [address]19 [facility] [place] via UPS or courier.     Antonietta Barcelona   [facility] Shared [facility] Specialty Technician

## 2018-04-18 MED FILL — AUBAGIO/14MG/TABS: AUBAGIO/14MG/TABS | 28 days supply | Qty: 28 | Fill #2

## 2018-05-10 NOTE — Unmapped (Signed)
[facility] Refill Coordination Note    Specialty Medication(s) to be Shipped:   Neurology: Aubagio    Other medication(s) to be shipped: Victoria Kelly, DOB: 1971-03-13  Phone: [phone] (home)   Shipping Address: [address]  [place] [address]    All above HIPAA information was verified with patient's family member.     Completed refill call assessment today to schedule patient's medication shipment from the [place] [facility] [phone]).       Specialty medication(s) and dose(s) confirmed: Regimen is correct and unchanged.   Changes to medications: Victoria Kelly reports no changes reported at this time.  Changes to insurance: No  Questions for the pharmacist: No    The patient will receive an FSI print out for each medication shipped and additional FDA Medication Guides as required.  Patient education from Victoria Kelly or [facility] Kelly may also be included in the shipment.    DISEASE/MEDICATION-SPECIFIC INFORMATION        N/A    ADHERENCE     Medication Adherence    Patient reported X missed doses in the last month:  0  Specialty Medication:  AUBAGIO 14MG   Patient is on additional specialty medications:  No  Patient is on more than two specialty medications:  No  Any gaps in refill history greater than 2 weeks in the last 3 months:  no  Demonstrates understanding of importance of adherence:  yes   Other adherence tool:  Patient has a bag of medicines that she takes before bed           Refill Coordination    Has the Patients' Contact Information Changed:  No  Is the Shipping Address Different:  No         MEDICARE PART B DOCUMENTATION     Not Applicable    SHIPPING     Shipping address confirmed in FSI.     Delivery Scheduled: Yes, Expected medication delivery date: 073019 via UPS or courier.     Victoria Kelly   [facility] Specialty Technician

## 2018-05-14 MED FILL — AUBAGIO/14MG/TABS: AUBAGIO/14MG/TABS | 28 days supply | Qty: 28 | Fill #3

## 2018-06-09 NOTE — Unmapped (Signed)
Patient is doing well at this time  Her daughter called back to schedule delivery  Said she wasn't sure what her mom had on hand but she's been doing well on it    [facility] Refill Coordination Note    Specialty Medication(s) to be Shipped:   Neurology: Victoria Kelly    Other medication(s) to be shipped: n/a     Victoria Kelly, DOB: 12-25-1970  Phone: [phone] (home)   Shipping Address: [address]  [place] [address]    All above HIPAA information was verified with patient's daughter     Completed refill call assessment today to schedule patient's medication shipment from the [facility] [phone]).       Specialty medication(s) and dose(s) confirmed: Regimen is correct and unchanged.   Changes to medications: Victoria Kelly reports no changes reported at this time.  Changes to insurance: No  Questions for the pharmacist: No    The patient will receive a drug information handout for each medication shipped and additional FDA Medication Guides as required.      DISEASE/MEDICATION-SPECIFIC INFORMATION        N/A    ADHERENCE     Medication Adherence    Patient reported X missed doses in the last month:  0  Specialty Medication:  aubagio  Patient is on additional specialty medications:  No  Patient is on more than two specialty medications:  No  Any gaps in refill history greater than 2 weeks in the last 3 months:  no  Demonstrates understanding of importance of adherence:  yes  Informant:  child/children   Other adherence tool:  Patient has a bag of medicines that she takes before bed   Support network for adherence:  family member  Confirmed plan for next specialty medication refill:  delivery by pharmacy  Refills needed for supportive medications:  not needed          Refill Coordination    Has the Patients' Contact Information Changed:  No  Is the Shipping Address Different:  No           SHIPPING     Shipping address confirmed in Epic.     Delivery Scheduled: Yes, Expected medication delivery date: 8/27 via UPS or courier.     Victoria Kelly   [facility] Shared [facility] Specialty Technician

## 2018-06-12 MED FILL — AUBAGIO 14 MG TABLET: 28 days supply | Qty: 28 | Fill #0

## 2018-06-12 MED FILL — AUBAGIO 14 MG TABLET: 28 days supply | Qty: 28 | Fill #0 | Status: AC

## 2018-06-21 MED ORDER — OXYBUTYNIN CHLORIDE ER 5 MG TABLET,EXTENDED RELEASE 24 HR
ORAL_TABLET | Freq: Every day | ORAL | 1 refills | 0.00000 days | Status: CP
Start: 2018-06-21 — End: 2018-08-01

## 2018-07-12 NOTE — Unmapped (Signed)
[facility] Pharm[facility] Refill Coordination Note  Specialty Medication(s): Aubagio 14mg       Victoria Kelly, DOB: 09-17-1971  Phone: [phone] (home) , Alternate phone contact: N/A  Phone or address changes today?: No  All above HIPAA information was verified with daughter  Shipping Address: [address]  [place] [address]   Insurance changes? No    Completed refill call assessment today to schedule patient's medication shipment from the [facility] [phone]).      Confirmed the medication and dosage are correct and have not changed: Yes, regimen is correct and unchanged.    Confirmed patient started or stopped the following medications in the past month:  No, there are no changes reported at this time.    Are you tolerating your medication?:  Victoria Kelly reports tolerating the medication.    ADHERENCE  \Did you miss any doses in the past 4 weeks? No missed doses reported.    FINANCIAL/SHIPPING    Delivery Scheduled: Yes, Expected medication delivery date: 07/14/2018     The patient will receive a drug information handout for each medication shipped and additional FDA Medication Guides as required.      Victoria Kelly did not have any additional questions at this time.    Delivery address validated in Epic.    We will follow up with patient monthly for standard refill processing and delivery.      Thank you,  Cyntha Brickman  Anders Grant   [facility] Specialty Pharmacist

## 2018-07-13 MED FILL — AUBAGIO 14 MG TABLET: 30 days supply | Qty: 30 | Fill #1

## 2018-07-13 MED FILL — AUBAGIO 14 MG TABLET: 30 days supply | Qty: 30 | Fill #1 | Status: AC

## 2018-07-20 MED ORDER — PANTOPRAZOLE 40 MG TABLET,DELAYED RELEASE
ORAL_TABLET | 3 refills | 0 days | Status: CP
Start: 2018-07-20 — End: 2019-04-09

## 2018-08-01 ENCOUNTER — Ambulatory Visit
Admit: 2018-08-01 | Discharge: 2018-08-02 | Payer: MEDICARE | Attending: Physician Assistant | Primary: Physician Assistant

## 2018-08-01 DIAGNOSIS — G4700 Insomnia, unspecified: Secondary | ICD-10-CM

## 2018-08-01 DIAGNOSIS — M792 Neuralgia and neuritis, unspecified: Secondary | ICD-10-CM

## 2018-08-01 DIAGNOSIS — G35 Multiple sclerosis: Principal | ICD-10-CM

## 2018-08-01 DIAGNOSIS — N3281 Overactive bladder: Secondary | ICD-10-CM

## 2018-08-01 LAB — CBC W/ AUTO DIFF
BASOPHILS ABSOLUTE COUNT: 0.1 10*9/L (ref 0.0–0.1)
BASOPHILS RELATIVE PERCENT: 2.1 %
EOSINOPHILS ABSOLUTE COUNT: 0.2 10*9/L (ref 0.0–0.4)
HEMATOCRIT: 38.3 % (ref 36.0–46.0)
HEMOGLOBIN: 12.6 g/dL (ref 12.0–16.0)
LARGE UNSTAINED CELLS: 3 % (ref 0–4)
LYMPHOCYTES ABSOLUTE COUNT: 1.3 10*9/L — ABNORMAL LOW (ref 1.5–5.0)
LYMPHOCYTES RELATIVE PERCENT: 29.6 %
MEAN CORPUSCULAR HEMOGLOBIN CONC: 32.8 g/dL (ref 31.0–37.0)
MEAN CORPUSCULAR VOLUME: 91.2 fL (ref 80.0–100.0)
MEAN PLATELET VOLUME: 8.8 fL (ref 7.0–10.0)
MONOCYTES ABSOLUTE COUNT: 0.3 10*9/L (ref 0.2–0.8)
MONOCYTES RELATIVE PERCENT: 6.1 %
NEUTROPHILS ABSOLUTE COUNT: 2.4 10*9/L (ref 2.0–7.5)
NEUTROPHILS RELATIVE PERCENT: 55.2 %
PLATELET COUNT: 211 10*9/L (ref 150–440)
RED BLOOD CELL COUNT: 4.2 10*12/L (ref 4.00–5.20)
RED CELL DISTRIBUTION WIDTH: 14.7 % (ref 12.0–15.0)
WBC ADJUSTED: 4.4 10*9/L — ABNORMAL LOW (ref 4.5–11.0)

## 2018-08-01 LAB — HEPATIC FUNCTION PANEL
ALBUMIN: 3.3 g/dL — ABNORMAL LOW (ref 3.5–5.0)
AST (SGOT): 27 U/L (ref 14–38)
BILIRUBIN TOTAL: 0.3 mg/dL (ref 0.0–1.2)

## 2018-08-01 LAB — ALKALINE PHOSPHATASE: Alkaline phosphatase:CCnc:Pt:Ser/Plas:Qn:: 80

## 2018-08-01 LAB — HEMATOCRIT: Lab: 38.3

## 2018-08-01 MED ORDER — TRAZODONE 50 MG TABLET
ORAL_TABLET | Freq: Every evening | ORAL | 1 refills | 0 days | Status: CP
Start: 2018-08-01 — End: 2019-02-05

## 2018-08-01 MED ORDER — PREGABALIN 75 MG CAPSULE
ORAL_CAPSULE | Freq: Two times a day (BID) | ORAL | 1 refills | 0 days | Status: CP
Start: 2018-08-01 — End: 2019-04-18

## 2018-08-01 MED ORDER — TERIFLUNOMIDE 14 MG TABLET
5 refills | 0 days | Status: CP
Start: 2018-08-01 — End: 2019-03-15
  Filled 2018-08-11: qty 30, 30d supply, fill #0

## 2018-08-01 MED ORDER — OXYBUTYNIN CHLORIDE ER 5 MG TABLET,EXTENDED RELEASE 24 HR
ORAL_TABLET | Freq: Every day | ORAL | 1 refills | 0 days | Status: CP
Start: 2018-08-01 — End: 2019-02-05

## 2018-08-01 NOTE — Unmapped (Signed)
[facility] [employer] [facility] of Medicine at [facility]  Multiple Sclerosis / Neuroimmunology Division  Victoria Kelly [facility]  Physician Assistant      Victoria Kelly  10-27-1970  [id number]  @address @     Direct entry by:  Annette Stable, PA-C.  Teaching Physician: Dr. Desma Mcgregor.    DATE OF VISIT: August 01, 2018    REASON FOR VISIT: Followup in the Neuroimmunology Clinic for evaluation of relapsing remitting multiple sclerosis. Last seen by Dr. Johnnye Lana 01/16/2018.    ASSESSMENT AND PLAN:  ** Relapsing Remitting Multiple Sclerosis:  -Started Aubagio 03/2017, refill.  -Check  CBC/d, LFT's.  -Order flu vaccine but it was not administered.  -Refill trazodone, Lyrica and oxybutynin.    ** I personally reviewed the patient's prior [facility] medical records, imaging, and lab work.  **Recommend to follow up with PCP for BP check.    -Return to clinic 6 months.  -Total visit time =   28   Minutes. 0256/0324.  Greater than 50% of the face to face time was spent in consultation and treatment planning on the  disease process, medication, dosing and side effects. MRI images reviewed personally by myself and with patient.    DIAGNOSTIC STUDIES / REVIEW OF RECORDS:  I personally reviewed the patient's prior [facility] medical records, imaging, and lab work.    INTERVAL HISTORY / CHIEF COMPLAINT:  Patient reports that they are taking DMT appropriately and are not experiencing any side effects.    Since the last visit the patient states that they have not had any of the following:   -Clinical flare-ups or received IV methylprednisolone.  -Double vision, pain with eye movement or color desaturation.  -Urinary leakage, urgency, inability to empty bladder or UTI's. Taking Oxybutynin.  -Falls.   -Depression or suicidal ideation.    PRIOR HISTORY: A 47 y.o. African American female. She is here with her daughter and grandson 32 months and 50 month odf nephew.  Ms. Baine has been diagnosed with MS in 2003.  Seen by Dr. Johnnye Lana on 01/16/2018. This is from her notes.  In 2003 she had left sided ON. Right sided ON occurred after a month. She now has color vision decrease. She also has a cataract, and did the surgery. In the following years she had an episode when she could not feel cold/warm on one side of her body. She got better but not completely. She further had an episode of right sided weakness and received steroids and recovered. 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   She was initially treated with Copaxone; however, discontinued in 2011 (per notes by Dr. Hunt Oris).   Started with Aubagio at the end of June 2018.     MRI REVIEW:  03/01/2018  MRI of the brain  w / wo contrast compared to 10/26/2016: There are multiple white matter  foci of abnormal increased T2/FLAIR signal, unchanged. Lesions are again identified in the cerebellum and brainstem. No enhancing lesions are seen.    ??The optic nerves are normal in appearance.    03/01/2018 MRI of the cervical spine  w / wo  contrast compared to 10/26/2016: Unchanged is the T2 hyperintense lesion within the right aspect of the cord at the C3 level consistent with aplaque: T1-weighted images demonstrate hypointensity. No new or enhancing lesions.    03/01/2018 MRI of the thoracic spine  w / wo  contrast compared to 12/21/2011:  No definitive spinal lesions identified. There is no abnormal enhancement. Unchanged is a small syrinx at the T10 level.    LUMBAR PUNCTURE:  Per notes by Dr. Alessandra Bevels on 04/18/2007 'she had a negative lumbar puncture' but I have no results available for my review.     MS FLARE UP HISTORY:  Had last flare up in 2015 with reccurrent optic neurtis with left  eye.  Was evaluated in ER, treated with course of IV steroids at home.  Does not remember details.    MS MEDICATION:  Copaxone 2011.  Aubagio 03/2017.    GYN HISTORY HISTORY:  No LMP recorded. Patient has had a hysterectomy.    REVIEW OF SYSTEMS:  A 10-systems review was performed and, unless otherwise noted, declared negative by patient.    Office Visit on 08/01/2018   Component Date Value Ref Range Status   ??? Albumin 08/01/2018 3.3* 3.5 - 5.0 g/dL Final   ??? Total Protein 08/01/2018 6.2* 6.5 - 8.3 g/dL Final   ??? Total Bilirubin 08/01/2018 0.3  0.0 - 1.2 mg/dL Final   ??? Bilirubin, Direct 08/01/2018 <0.10  0.00 - 0.40 mg/dL Final   ??? AST 16/07/9603 27  14 - 38 U/L Final   ??? ALT 08/01/2018 24  15 - 48 U/L Final   ??? Alkaline Phosphatase 08/01/2018 80  38 - 126 U/L Final   ??? WBC 08/01/2018 4.4* 4.5 - 11.0 10*9/L Final   ??? RBC 08/01/2018 4.20  4.00 - 5.20 10*12/L Final   ??? HGB 08/01/2018 12.6  12.0 - 16.0 g/dL Final   ??? HCT 54/06/8118 38.3  36.0 - 46.0 % Final   ??? MCV 08/01/2018 91.2  80.0 - 100.0 fL Final   ??? MCH 08/01/2018 29.9  26.0 - 34.0 pg Final   ??? MCHC 08/01/2018 32.8  31.0 - 37.0 g/dL Final   ??? RDW 14/78/2956 14.7  12.0 - 15.0 % Final   ??? MPV 08/01/2018 8.8  7.0 - 10.0 fL Final   ??? Platelet 08/01/2018 211  150 - 440 10*9/L Final   ??? Neutrophils % 08/01/2018 55.2  % Final   ??? Lymphocytes % 08/01/2018 29.6  % Final   ??? Monocytes % 08/01/2018 6.1  % Final   ??? Eosinophils % 08/01/2018 4.0  % Final   ??? Basophils % 08/01/2018 2.1  % Final   ??? Absolute Neutrophils 08/01/2018 2.4  2.0 - 7.5 10*9/L Final   ??? Absolute Lymphocytes 08/01/2018 1.3* 1.5 - 5.0 10*9/L Final   ??? Absolute Monocytes 08/01/2018 0.3  0.2 - 0.8 10*9/L Final   ??? Absolute Eosinophils 08/01/2018 0.2  0.0 - 0.4 10*9/L Final   ??? Absolute Basophils 08/01/2018 0.1  0.0 - 0.1 10*9/L Final   ??? Large Unstained Cells 08/01/2018 3  0 - 4 % Final   ??? Hypochromasia 08/01/2018 Moderate* Not Present Final       PROBLEM LIST:  Patient Active Problem List   Diagnosis   ??? Multiple sclerosis (CMS-HCC)   ??? Dysthymic disorder   ??? Obesity   ??? Obstructive sleep apnea   ??? Weakness of wrist   ??? Ankle arthritis   ??? Allergic rhinitis   ??? Insomnia   ??? Low back pain   ??? Iron deficiency anemia   ??? Acne   ??? H/O cataract removal with insertion of prosthetic lens   ??? Hyperlipidemia   ??? History of bariatric surgery   ??? Elevated serum creatinine   ??? Morbid (severe) obesity due to  excess calories (CMS-HCC)   ??? S/P TAH (total abdominal hysterectomy)   ??? Rash     Current Outpatient Medications   Medication Sig Dispense Refill   ??? acetaminophen (TYLENOL) 500 MG tablet Take 2 tablets (1,000 mg total) by mouth every eight (8) hours as needed for pain. 30 tablet 0   ??? cholecalciferol, vitamin D3, (VITAMIN D3) 2,000 unit cap Take 4,000 Units by mouth daily.     ??? ergocalciferol (DRISDOL) 50,000 unit capsule Take 1 capsule (50,000 Units total) by mouth once a week. 24 capsule 0   ??? ferrous sulfate 325 (65 FE) MG tablet Take 1 tablet (325 mg total) by mouth daily with breakfast. (Patient not taking: Reported on 01/16/2018) 90 tablet 3   ??? fluticasone (FLONASE) 50 mcg/actuation nasal spray 2 sprays by Each Nare route daily. 16 g 6   ??? melatonin 5 mg tablet Take 5 mg by mouth nightly as needed.     ??? multivitamin (TAB-A-VITE/THERAGRAN) per tablet Take 1 tablet by mouth daily. 90 tablet 3   ??? oxybutynin (DITROPAN-XL) 5 MG 24 hr tablet Take 1 tablet (5 mg total) by mouth daily. In the morning. 90 tablet 1   ??? pantoprazole (PROTONIX) 40 MG tablet TAKE 1 TABLET BY MOUTH EVERY DAY 90 tablet 3   ??? PARoxetine (PAXIL) 30 MG tablet START TAKING 1 PILL A DAY, CAN INCREASE TO 2 PILLS A DAY AFTER 2-3 WEEKS. 180 tablet 1   ??? pregabalin (LYRICA) 75 MG capsule Take 1 capsule (75 mg total) by mouth Two (2) times a day. On Day 1 take 1 pill at bedtime. From Day 2 continue 1 pill two times a day. 180 capsule 1   ??? teriflunomide 14 mg Tab TAKE ONE TABLET DAILY - MUST COME TO APPOINTMENT FOR FUTURE FILLS 30 each 5   ??? traZODone (DESYREL) 50 MG tablet Take 1 tablet (50 mg total) by mouth nightly. 90 tablet 1   ??? triamcinolone (KENALOG) 0.1 % cream Apply topically Two (2) times a day. Do not apply to face 15 g 0   ??? zinc sulfate (ZINCATE) 220 (50) mg capsule Take 1 capsule (220 mg total) by mouth daily. 90 capsule 3     No current facility-administered medications for this visit.          Past Surgical Hx:  Past Surgical History:   Procedure Laterality Date   ??? ABDOMINAL HYSTERECTOMY  05/05/2006    Ovaries were kept inside   ??? BARIATRIC SURGERY  2014   ??? CATARACT EXTRACTION Bilateral 08/14/2003, 09/09/2003   ??? CESAREAN SECTION  01/12/2000   ??? DEBRIDEMENT LEG Left 09/30/1999    Spider bite   ??? ENDOMETRIAL ABLATION  02/2005   ??? ENDOMETRIAL ABLATION  02/11/2005   ??? OOPHORECTOMY     ??? PR LAP, GAST RESTRICT PROC, LONGITUDINAL GASTRECTOMY Midline 02/15/2014    Procedure: ROBOTIC PR LAP, GAST RESTRICT PROC, LONGITUDINAL GASTRECTOMY;  Surgeon: Aida Raider, MD;  Location: MAIN OR [facility];  Service: Gastrointestinal   ??? TONSILLECTOMY  1982   ??? TOTAL ABDOMINAL HYSTERECTOMY     ??? TUBAL LIGATION  01/12/2000       Social Hx:  Social History     Socioeconomic History   ??? Marital status: Divorced     Spouse name: None   ??? Number of children: 3   ??? Years of education: None   ??? Highest education level: None   Occupational History   ??? Occupation: Disabiled  Employer: unknown   Social Needs   ??? Financial resource strain: None   ??? Food insecurity:     Worry: None     Inability: None   ??? Transportation needs:     Medical: None     Non-medical: None   Tobacco Use   ??? Smoking status: Never Smoker   ??? Smokeless tobacco: Never Used   Substance and Sexual Activity   ??? Alcohol use: No   ??? Drug use: No   ??? Sexual activity: Not Currently   Lifestyle   ??? Physical activity:     Days per week: None     Minutes per session: None   ??? Stress: None   Relationships   ??? Social connections:     Talks on phone: None     Gets together: None     Attends religious service: None     Active member of club or organization: None     Attends meetings of clubs or organizations: None     Relationship status: None   Other Topics Concern   ??? Do you use sunscreen? No   ??? Tanning bed use? No   ??? Are you easily burned? No   ??? Excessive sun exposure? No   ??? Blistering sunburns? Not Asked   Social History Narrative    Single, has 3 children, aged 54-25. Married in 1997, divorced in 2001. Lives with her boyfriend. On disability (r/t Multiple Sclerosis), previously employed as a Interior and spatial designer, bus Hospital doctor. Born and raised in [place]. Attended [employer] in 1993.    Attended [employer] and [employer], some college credits attained.       Family Hx:  Family History   Problem Relation Age of Onset   ??? Breast cancer Cousin    ??? Cervical cancer Cousin    ??? Brain cancer Father         Brain tumor   ??? Hyperlipidemia Father    ??? Lupus Mother    ??? Hypertension Mother    ??? Fibromyalgia Mother    ??? Heart disease Mother         aortic valve replacement   ??? Cataracts Mother    ??? Leukemia Maternal Aunt    ??? Lupus Maternal Aunt    ??? Lupus Maternal Aunt        ALLERGIES:  Allergies   Allergen Reactions   ??? Cymbalta [Duloxetine] Other (See Comments)     Metal taste in mouth     ??? Keflex [Cephalexin] Other (See Comments)     rash       VITAL SIGNS  BP 150/88 (BP Site: R Arm, BP Position: Sitting, BP Cuff Size: Medium)  - Pulse 78  - Resp 19  - Ht 170.2 cm (5' 7)  - Wt (!) 138.8 kg (306 lb)  - BMI 47.93 kg/m??     PHYSICAL EXAMINATION:  GENERAL:  Alert and oriented to person, place, time and situation.  Obese.  Recent and remote memory intact.      Neurological Examination:   Cranial Nerves:   II, III- Pupils are equal 3 mm and reactive to light b/l.  III, IV, VI- extra ocular movements are intact, No ptosis, no nystagmus.  V- sensation of the face intact b/l.  VII- face symmetrical, no facial droop, normal facial movements with smile/grimace  VIII- Hearing grossly intact.  IX and X- symmetric palate contraction, normal gag bilaterally  XI- Full shoulder shrug bilaterally  XII- Tongue  protrudes midline, full range of movements of the tongue    Motor Exam:      Muscles UEs   LEs     R L   R L   Deltoids 5/5 5/5 Hip flexors  5/5 5/5   Biceps 5/5 5/5 Hip extensors 5/5 5/5   Triceps 5/5 5/5 Knee flexors 5/5 5/5   Hand grip 5/5 5/5 Knee extensors 5/5 5/5   Wrist flexors 5/5 5/5 Foot dorsal flexors 5/5 5/5   Wrist extensors 5/5 5/5 Foot plantar flexors 5/5 5/5   Finger flexors 5/5 5/5      Finger extensors 5/5 5/5         Normal bulk and tone.  No clonus.    Reflexes R L   Brachioradialis +3 +3   Biceps +3 +3   Triceps +2 +2   Patella +3 +3   Achilles +2 +2     Negative babinski.    Sensory UEs LEs    R L R L          Pin prick WNL Decreased and unable to tell sharp from dull WNL Decreased and unable to tell sharp from dull   Vibration WNL WNL WNL Decreased   Proprioception WNL WNL WNL WNL        Cerebellar/Coordination:  Rapid alternating movements, finger-to-nose and heel-to-shin  bilaterally demonstrates no abnormalities.    Romberg was intact with eyes closed.  No ataxia or tremors  noted    Gait: Normal stride, base and  armswing. Able to tandem, heel, and toe gait without difficulty.

## 2018-08-01 NOTE — Unmapped (Signed)
??     In case of:  ?? a suspected relapse (new symptoms or worsening existing symptoms, lasting for >24h)  OR  ?? a need for an additional appointment for other reasons     Please contact:    [facility] Neurology Call Center  Phone: [phone]   Fax: [phone]       [facility], Department of Neurology /  Multiple Sclerosis Division    [address]    [place], [place] [address]

## 2018-08-04 NOTE — Unmapped (Signed)
[facility] Refill Coordination Note    Specialty Medication(s) to be Shipped:   Neurology: [name]    Other medication(s) to be shipped: n/a     Victoria Kelly, DOB: Jan 30, 1971  Phone: [phone] (home)       All above HIPAA information was verified with patient.     Completed refill call assessment today to schedule patient's medication shipment from the [facility] [phone]).       Specialty medication(s) and dose(s) confirmed: Regimen is correct and unchanged.   Changes to medications: Victoria Kelly reports no changes reported at this time.  Changes to insurance: No  Questions for the pharmacist: No    The patient will receive a drug information handout for each medication shipped and additional FDA Medication Guides as required.      DISEASE/MEDICATION-SPECIFIC INFORMATION        N/A    ADHERENCE     Medication Adherence         Other adherence tool:  Patient has a bag of medicines that she takes before bed   Support network for adherence:  family member                  MEDICARE PART B DOCUMENTATION     Not Applicable    SHIPPING     Shipping address confirmed in Epic.     Delivery Scheduled: Yes, Expected medication delivery date: 08/14/18 via UPS or courier.     Medication will be delivered via UPS to the home address in Epic [place].    Victoria Kelly   [facility] Specialty Pharmacist

## 2018-08-11 MED FILL — AUBAGIO 14 MG TABLET: 30 days supply | Qty: 30 | Fill #0 | Status: AC

## 2018-08-18 NOTE — Unmapped (Deleted)
Internal Medicine Clinic Visit    Reason for visit: ***    A/P:  76F with MS, Vit D def, and s/p gastric bypass returns for follow up.  ??  Urinary incontinence, urge:   - continue ditropan and bladder training exercises   ??  S/p Gastric Bypass:   - Continue MVM, iron supplement, vitamin D, and zinc  ??  Vitamin D Deficiency: Vit D (25OH) 8 in 10/2016; on Vit D 4000 IU/day - but takes about once a week.   - F/u vit D level***  ??  GERD: Continue omeprazole  ??  MS: Followed by Dr. Johnnye Lana, worsening leg pain and new urinary incontinence. Started Lyrica today for leg pain. Continues on Aubagio.  ??  Elevated Cr: Creatinine has been elevated since establishing care in 2014, max 1.30 in 06/2017 but today down to 1.04. Unclear etiology, no DM, HTN, proteinuria, ANA prev low titer at 1:80. She does have a significant family hx of lupus, but no symptoms to suggest this is the case. Given her Cr has normalized, will hold off on imaging and monitoring at least annually.  ??  Anxiety: Paxil 60 mg daily    There are no diagnoses linked to this encounter.    No follow-ups on file.    Staffed with ***    __________________________________________________________    HPI:    ***  __________________________________________________________    Problem List:  Patient Active Problem List   Diagnosis   ??? Multiple sclerosis (CMS-HCC)   ??? Dysthymic disorder   ??? Obesity   ??? Obstructive sleep apnea   ??? Weakness of wrist   ??? Ankle arthritis   ??? Allergic rhinitis   ??? Insomnia   ??? Low back pain   ??? Iron deficiency anemia   ??? Acne   ??? H/O cataract removal with insertion of prosthetic lens   ??? Hyperlipidemia   ??? History of bariatric surgery   ??? Elevated serum creatinine   ??? Morbid (severe) obesity due to excess calories (CMS-HCC)   ??? S/P TAH (total abdominal hysterectomy)   ??? Rash       Medications:  Reviewed in EPIC  __________________________________________________________    Physical Exam:   Vital Signs:  There were no vitals filed for this visit. Gen: Well appearing, NAD  CV: RRR, no murmurs  Pulm: CTA bilaterally, no crackles or wheezes  Abd: Soft, NTND, normal BS. No HSM.  Ext: No edema  ***      I have reviewed and addressed the patient???s adherence and response to prescribed medications. I have identified patient barriers to following the proposed medication and treatment plan, and have noted opportunities to optimize healthy behaviors. I have answered the patient???s questions to satisfaction and the patient voices understanding.

## 2018-09-07 MED FILL — AUBAGIO 14 MG TABLET: 30 days supply | Qty: 30 | Fill #1

## 2018-09-07 MED FILL — AUBAGIO 14 MG TABLET: 30 days supply | Qty: 30 | Fill #1 | Status: AC

## 2018-09-07 NOTE — Unmapped (Signed)
[facility] Refill Coordination Note  Specialty Medication(s): JANIN KOZLOWSKI, DOB: [birth year]  Phone: [phone] (home) , Alternate phone contact: N/A  Phone or address changes today?: No  All above HIPAA information was verified with patient.  Shipping Address: [address]  [place] [address]   Insurance changes? No    Completed refill call assessment today to schedule patient's medication shipment from the [facility] [phone]).      Confirmed the medication and dosage are correct and have not changed: Yes, regimen is correct and unchanged.    Confirmed patient started or stopped the following medications in the past month:  No, there are no changes reported at this time.    Are you tolerating your medication?:  Altha reports tolerating the medication.    ADHERENCE        Did you miss any doses in the past 4 weeks? No missed doses reported.    FINANCIAL/SHIPPING    Delivery Scheduled: Yes, Expected medication delivery date: 11/22 PER PT REQUEST    Medication will be delivered via Next Day Courier to the home address in [facility].    The patient will receive a drug information handout for each medication shipped and additional FDA Medication Guides as required.      Lakeithia did not have any additional questions at this time.    We will follow up with patient monthly for standard refill processing and delivery.      Thank you,  Westley Gambles   [facility] Specialty Technician

## 2018-09-18 ENCOUNTER — Ambulatory Visit (INDEPENDENT_AMBULATORY_CARE_PROVIDER_SITE_OTHER): Payer: 59 | Admitting: Podiatry

## 2018-09-18 ENCOUNTER — Encounter: Payer: Self-pay | Admitting: Podiatry

## 2018-09-18 VITALS — BP 137/79 | HR 73

## 2018-09-18 DIAGNOSIS — M79675 Pain in left toe(s): Secondary | ICD-10-CM | POA: Diagnosis not present

## 2018-09-18 DIAGNOSIS — M79674 Pain in right toe(s): Secondary | ICD-10-CM

## 2018-09-18 DIAGNOSIS — B351 Tinea unguium: Secondary | ICD-10-CM

## 2018-09-18 NOTE — Progress Notes (Signed)
This patient presents to the office with chief complaint of thick big toenails both feet.  She says that she would go to the nail salon in about a week ago she realized she had black discoloration noted under her toenail right foot.  She took a picture of the toenail to show me.  The picture revealed subungual debris with black discoloration.  Patient states she is not having any pain or discomfort to her toenails.  Patient presents the office today for an evaluation and treatment of these discolored nails.  Vascular  Dorsalis pedis and posterior tibial pulses are palpable  B/L.  Capillary return  WNL.  Temperature gradient is  WNL.  Skin turgor  WNL  Sensorium  Senn Weinstein monofilament wire  WNL. Normal tactile sensation.  Nail Exam  Patient has normal nails 2-4  B/L.  Patient has pincer nails hallux  B/l  Orthopedic  Exam  Muscle tone and muscle strength  WNL.  No limitations of motion feet  B/L.  No crepitus or joint effusion noted.  Foot type is unremarkable and digits show no abnormalities.  Bony prominences are unremarkable.  Skin  No open lesions.  Normal skin texture and turgor.    Onychomycosis  Hallux  B/l. Marland Kitchen Debride nails   X 2.  Discussed this condition with this patient.  Told her she would benefit from topical medicine applied to her hallux toenails.  RTC prn.   Helane Gunther DPM

## 2018-09-29 NOTE — Unmapped (Signed)
[facility] Pharm[facility] Refill Coordination Note    Specialty Medication(s) to be Shipped:   Neurology: [name]    Other medication(s) to be shipped:      [name], DOB: June 04, 1971  Victoria Kelly  Phone: [phone] (home)   Shipping Address: [address]  [place] [address]           All above HIPAA information was verified with patient.     Completed refill call assessment today to schedule patient's medication shipment from the [facility] [phone]).       Specialty medication(s) and dose(s) confirmed: Regimen is correct and unchanged.   Changes to medications: Lateya reports no changes reported at this time.  Changes to insurance: No  Questions for the pharmacist: No    The patient will receive a drug information handout for each medication shipped and additional FDA Medication Guides as required.      DISEASE/MEDICATION-SPECIFIC INFORMATION        N/A        MEDICARE PART B DOCUMENTATION     Not Applicable        ADHERENCE     Medication Adherence    Patient reported X missed doses in the last month:  0  Specialty Medication:  AUBAGIO 14 MG   Patient is on additional specialty medications:  No  Informant:  patient       Other adherence tool:  Patient has a bag of medicines that she takes before bed   Support network for adherence:  family member      Confirmed plan for next specialty medication refill:  delivery by pharmacy  Refills needed for supportive medications:  not needed          Refill Coordination    Has the Patients' Contact Information Changed:  No  Is the Shipping Address Different:  No         Drug Interactions    Clinically relevant drug interactions identified:  no                     ADDITIONAL NOTES         Patient reported recent change in blood pressure, increased to between 140-150/90. Transferred her to [facility] for consult.         SHIPPING     Shipping Address: [address]  [place] [address]    Shipping address confirmed in Epic. Delivery Scheduled: Yes, Expected medication delivery date: 10/05/18 via UPS or courier.     Medication will be delivered via Next Day Courier to the home address in Epic [place].    Jolene Schimke , CPhT   [facility] Specialty Technician

## 2018-10-04 MED FILL — AUBAGIO 14 MG TABLET: 30 days supply | Qty: 30 | Fill #2

## 2018-10-04 MED FILL — AUBAGIO 14 MG TABLET: 30 days supply | Qty: 30 | Fill #2 | Status: AC

## 2018-11-02 NOTE — Unmapped (Signed)
[facility] Pharmacy Refill Coordination Note  Specialty Medication(s): AUBAGIO [address] [place], DOB: 06/30/1971  Phone: [phone] (home) , Alternate phone contact: N/A  Phone or address changes today?: No  All above HIPAA information was verified with patient.  Shipping Address: [address]  [place] [address]   Insurance changes? No    Completed refill call assessment today to schedule patient's medication shipment from the [facility] [phone]).      Confirmed the medication and dosage are correct and have not changed: Yes, regimen is correct and unchanged.    Confirmed patient started or stopped the following medications in the past month:  No, there are no changes reported at this time.    Are you tolerating your medication?:  Roshawn reports tolerating the medication.    ADHERENCE    (Below is required for Medicare Part B or Transplant patients only - per drug):   How many tablets were dispensed last month: 30  Patient currently has 7 remaining.    Did you miss any doses in the past 4 weeks? No missed doses reported.    FINANCIAL/SHIPPING    Delivery Scheduled: Yes, Expected medication delivery date: 11/07/2018     Medication will be delivered via Same Day Courier to the home address in [facility].    The patient will receive a drug information handout for each medication shipped and additional FDA Medication Guides as required.      Timera did not have any additional questions at this time.    We will follow up with patient monthly for standard refill processing and delivery.      Thank you,  Westley Gambles   [facility] Specialty Technician

## 2018-11-06 MED FILL — AUBAGIO 14 MG TABLET: 30 days supply | Qty: 30 | Fill #3 | Status: AC

## 2018-11-06 MED FILL — AUBAGIO 14 MG TABLET: 30 days supply | Qty: 30 | Fill #3

## 2018-11-14 NOTE — Unmapped (Signed)
Please click on Chart from this encounter, double click on this encounter from the chart,  and follow these instructions.     See Patient-Level Documents below. Locate recently scanned External Records labeled Print and Sign. Click hyperlink to view and print paperwork. Please complete within 2 business days.     Once completed, please place in nearest Internal Medicine outbox to be faxed.     Please let me know if I can do anything to help you. Thanks.

## 2018-11-15 NOTE — Unmapped (Signed)
Specialty Pharmacy - Neurology Medication Clinical Assessment       Victoria Kelly is a 48 y.o. female contacted today regarding her specialty medication(s) teriflunomide (AUBAGIO)    Verified patient's date of birth / HIPAA.    Medications reviewed and verified with patient: Allergies - Medications -      Specialty medication(s) and dose(s) confirmed: yes  Changes to medications: no  Changes to insurance: no     Is therapy still appropriate given the disease, patient response, and medical condition? yes; of note, patient states today she feels like she is getting a sinus cold. He has nasal/sinus congestion but is not taking anything over the counter at this time.  Is therapy still effective? yes    Medication Adherence    Patient reported X missed doses in the last month:  0  Specialty Medication:  Aubaio  Patient is on additional specialty medications:  No  Patient is on more than two specialty medications:  No  Demonstrates understanding of importance of adherence:  yes  Informant:  patient  Reliability of informant:  reliable  Provider-estimated medication adherence level:  good  Patient is at risk for Non-Adherence:  No  Reasons for non-adherence:  no problems identified   Other adherence tool:  Patient has a bag of medicines that she takes before bed; has routine   Support network for adherence:  family member  Confirmed plan for next specialty medication refill:  delivery by pharmacy           Adverse Effects    *All other systems reviewed and are negative       Drug Interactions    Drug interactions evaluated:  yes  Clinically relevant drug interactions identified:  no  Provided the patient with educational material regarding drug interactions:  not applicable       Patient Counseling    Counseled the patient on the following:  doses and administration discussed, possible adverse effects and management discussed, pharmacy contact information discussed         The patient will receive an Epic WAM print out for each medication shipped and additional FDA Medication Guides as required. Patient education from [place] or [name] may also be included in the shipment.       [name], PharmD, CPP  Clinical Pharmacist, [facility]  Phone: [phone]

## 2018-12-08 NOTE — Unmapped (Unsigned)
The [facility] has made a third and final attempt to reach this patient to refill the following medication: Aubagio 14mg .      We have been unable to leave messages on the following phone numbers: [phone], [phone]. [phone]     Dates contacted: 2/11, 2/18, 2/21  Last scheduled delivery: 11/06/18    The patient may be at risk of non-compliance with this medication. The patient should call the [facility] at [phone] (option 4) to refill medication.    Victoria Kelly   [facility] Specialty Technician

## 2018-12-14 NOTE — Unmapped (Unsigned)
Upon investigation, one additional attempt for patient contact is warranted.  The [facility] will reach out to patient on 12/13/18.     Arnold Long  [facility] Specialty Pharmacist

## 2018-12-14 NOTE — Unmapped (Signed)
I tried to call this patient on all available contact numbers this morning. The voicemail is not available.

## 2018-12-29 ENCOUNTER — Encounter: Admit: 2018-12-29 | Discharge: 2018-12-29 | Disposition: A | Discharge status: Home / Self Care | Payer: MEDICARE

## 2018-12-29 DIAGNOSIS — F341 Dysthymic disorder: Principal | ICD-10-CM

## 2018-12-29 DIAGNOSIS — M79604 Pain in right leg: Principal | ICD-10-CM

## 2018-12-29 DIAGNOSIS — R600 Localized edema: Principal | ICD-10-CM

## 2018-12-29 DIAGNOSIS — G4733 Obstructive sleep apnea (adult) (pediatric): Principal | ICD-10-CM

## 2018-12-29 DIAGNOSIS — D509 Iron deficiency anemia, unspecified: Principal | ICD-10-CM

## 2018-12-29 DIAGNOSIS — M7989 Other specified soft tissue disorders: Principal | ICD-10-CM

## 2018-12-29 DIAGNOSIS — E7800 Pure hypercholesterolemia, unspecified: Principal | ICD-10-CM

## 2018-12-29 DIAGNOSIS — G35 Multiple sclerosis: Principal | ICD-10-CM

## 2018-12-29 DIAGNOSIS — R0602 Shortness of breath: Principal | ICD-10-CM

## 2018-12-29 DIAGNOSIS — F411 Generalized anxiety disorder: Principal | ICD-10-CM

## 2018-12-29 DIAGNOSIS — Z881 Allergy status to other antibiotic agents status: Principal | ICD-10-CM

## 2018-12-29 DIAGNOSIS — Z9989 Dependence on other enabling machines and devices: Principal | ICD-10-CM

## 2018-12-29 DIAGNOSIS — Z7901 Long term (current) use of anticoagulants: Principal | ICD-10-CM

## 2018-12-29 DIAGNOSIS — G4700 Insomnia, unspecified: Principal | ICD-10-CM

## 2018-12-29 DIAGNOSIS — Z7951 Long term (current) use of inhaled steroids: Principal | ICD-10-CM

## 2018-12-29 DIAGNOSIS — J309 Allergic rhinitis, unspecified: Principal | ICD-10-CM

## 2018-12-29 DIAGNOSIS — Z6841 Body Mass Index (BMI) 40.0 and over, adult: Principal | ICD-10-CM

## 2018-12-29 DIAGNOSIS — Z8489 Family history of other specified conditions: Principal | ICD-10-CM

## 2018-12-29 DIAGNOSIS — Z9884 Bariatric surgery status: Principal | ICD-10-CM

## 2018-12-29 DIAGNOSIS — K130 Diseases of lips: Principal | ICD-10-CM

## 2018-12-29 DIAGNOSIS — Z79899 Other long term (current) drug therapy: Principal | ICD-10-CM

## 2018-12-29 DIAGNOSIS — Z8619 Personal history of other infectious and parasitic diseases: Principal | ICD-10-CM

## 2018-12-29 MED ORDER — AMOXICILLIN 500 MG CAPSULE
ORAL_CAPSULE | Freq: Three times a day (TID) | ORAL | 0 refills | 0 days | Status: CP
Start: 2018-12-29 — End: 2019-01-05

## 2018-12-29 NOTE — Unmapped (Signed)
[facility]  Emergency Department Provider Note      ED Clinical Impression     Final diagnoses:   Right leg swelling (Primary)   Right leg pain       Initial Impression, ED Course, Assessment and Plan     Time seen: December 29, 2018 1:59 AM   Victoria Kelly is a 48 y.o. female presenting with right leg swelling for 1 week.  Patient has unilateral swelling to just above the knee down to the foot of the right leg with edema.  Limited bedside ultrasound showed compressible popliteal veins as well as superficial calf and collateral veins.  Showed cobblestoning with extravascular fluid present in the calf.  Could represent DVT despite no obvious signs on limited bedside ultrasound thus will give 1 dose of Lovenox here in the emergency department and instructed come back during business hours between 8 AM and 4 PM when PVL is available for a formal study.  Also potentially cellulitis given the redness, swelling, and pain thus will start on amoxicillin for coverage of nonpurulent cellulitis given allergy to Keflex.  Has full range of motion of the hip and knee and no evidence of septic arthritis on exam.  Patient is not tachycardic, not hypoxic and has not had chest pain or shortness of breath that would suggest PE that would warrant CTA of the chest at this time.  Patient also notes dried lips, and a point-of-care glucose was checked and normal but she has had continued urine output thus I do not think she requires further evaluation for this at the time.  She has scheduled follow-up with her PCP next week.    ____________________________________________         History     Chief Complaint  Calf Pain      HPI   Victoria Kelly is a 48 y.o. female who presents to the [facility] Emergency Department for 1 week of right leg pain and swelling.  Patient reports 1 week ago she noticed right leg pain and swelling that is caused difficulty with ambulation due to pain from the foot and calf as well as behind of the knee extending up to the hip, and low back.  Pain does not radiate from the back down but rather the foot up.  This evening the pain got worse when she was attempting to get into bed and put weight on the knee thus causing her presentation to the emergency department.  Patient denies estrogen use, shortness of breath, chest pain in the last week during her time of her symptoms and no dyspnea on exertion.  She does report she has her usual shortness of breath due to her increased body mass index but there is been no change in this.  Patient denies fevers, chills or systemic symptoms.  Reports she is just noticed 1 week of ongoing redness, swelling of the right leg.      Past Medical History  Past Medical History:   Diagnosis Date   ??? Allergic rhinitis, cause unspecified 04/11/2013   ??? Dysthymic disorder 02/10/2012   ??? Generalized anxiety disorder 02/10/2012   ??? H/O bariatric surgery    ??? High blood cholesterol level 05/31/2013   ??? Hx of trichomonal vaginitis 02/2006    On Pap smear   ??? Insomnia 05/23/2013    Getting ambien through psych     ??? Iron deficiency anemia 05/24/2013   ??? Morbid obesity with BMI of 50.0-59.9, adult (CMS-HCC) 11/26/2011   ???  MS (multiple sclerosis) (CMS-HCC) 11/23/2001   ??? Obstructive sleep apnea 11/01/2011    uses cpap        Past Surgical History  Past Surgical History:   Procedure Laterality Date   ??? ABDOMINAL HYSTERECTOMY  05/05/2006    Ovaries were kept inside   ??? BARIATRIC SURGERY  2014   ??? CATARACT EXTRACTION Bilateral 08/14/2003, 09/09/2003   ??? CESAREAN SECTION  01/12/2000   ??? DEBRIDEMENT LEG Left 09/30/1999    Spider bite   ??? ENDOMETRIAL ABLATION  02/2005   ??? ENDOMETRIAL ABLATION  02/11/2005   ??? OOPHORECTOMY     ??? PR LAP, GAST RESTRICT PROC, LONGITUDINAL GASTRECTOMY Midline 02/15/2014    Procedure: ROBOTIC PR LAP, GAST RESTRICT PROC, LONGITUDINAL GASTRECTOMY;  Surgeon: Aida Raider, MD;  Location: MAIN OR [place];  Service: Gastrointestinal   ??? TONSILLECTOMY  1982   ??? TOTAL ABDOMINAL HYSTERECTOMY     ??? TUBAL LIGATION  01/12/2000       Medications    Current Facility-Administered Medications:   ???  amoxicillin (AMOXIL) capsule 500 mg, 500 mg, Oral, Once, Jocelyn Lamer, MD  ???  enoxaparin (LOVENOX) injection 150 mg, 1 mg/kg, Subcutaneous, Once, Jocelyn Lamer, MD  ???  oxyCODONE-acetaminophen (PERCOCET) 5-325 mg tablet 1 tablet, 1 tablet, Oral, Once, Jocelyn Lamer, MD    Current Outpatient Medications:   ???  fluticasone (FLONASE) 50 mcg/actuation nasal spray, 2 sprays by Each Nare route daily., Disp: 16 g, Rfl: 6  ???  pantoprazole (PROTONIX) 40 MG tablet, TAKE 1 TABLET BY MOUTH EVERY DAY, Disp: 90 tablet, Rfl: 3  ???  acetaminophen (TYLENOL) 500 MG tablet, Take 2 tablets (1,000 mg total) by mouth every eight (8) hours as needed for pain., Disp: 30 tablet, Rfl: 0  ???  amoxicillin (AMOXIL) 500 MG capsule, Take 1 capsule (500 mg total) by mouth Three (3) times a day for 7 days., Disp: 21 capsule, Rfl: 0  ???  cholecalciferol, vitamin D3, (VITAMIN D3) 2,000 unit cap, Take 4,000 Units by mouth daily., Disp: , Rfl:   ???  ergocalciferol (DRISDOL) 50,000 unit capsule, Take 1 capsule (50,000 Units total) by mouth once a week., Disp: 24 capsule, Rfl: 0  ???  ferrous sulfate 325 (65 FE) MG tablet, Take 1 tablet (325 mg total) by mouth daily with breakfast. (Patient not taking: Reported on 01/16/2018), Disp: 90 tablet, Rfl: 3  ???  melatonin 5 mg tablet, Take 5 mg by mouth nightly as needed., Disp: , Rfl:   ???  multivitamin (TAB-A-VITE/THERAGRAN) per tablet, Take 1 tablet by mouth daily. (Patient not taking: Reported on 12/29/2018), Disp: 90 tablet, Rfl: 3  ???  oxybutynin (DITROPAN-XL) 5 MG 24 hr tablet, Take 1 tablet (5 mg total) by mouth daily. In the morning., Disp: 90 tablet, Rfl: 1  ???  PARoxetine (PAXIL) 30 MG tablet, START TAKING 1 PILL A DAY, CAN INCREASE TO 2 PILLS A DAY AFTER 2-3 WEEKS., Disp: 180 tablet, Rfl: 1  ???  pregabalin (LYRICA) 75 MG capsule, Take 1 capsule (75 mg total) by mouth Two (2) times a day. On Day 1 take 1 pill at bedtime. From Day 2 continue 1 pill two times a day., Disp: 180 capsule, Rfl: 1  ???  teriflunomide 14 mg Tab, TAKE ONE TABLET DAILY - MUST COME TO APPOINTMENT FOR FUTURE FILLS, Disp: 30 each, Rfl: 5  ???  traZODone (DESYREL) 50 MG tablet, Take 1 tablet (50 mg total) by mouth nightly. (Patient not taking: Reported on 12/29/2018),  Disp: 90 tablet, Rfl: 1  ???  triamcinolone (KENALOG) 0.1 % cream, Apply topically Two (2) times a day. Do not apply to face (Patient not taking: Reported on 12/29/2018), Disp: 15 g, Rfl: 0  ???  zinc sulfate (ZINCATE) 220 (50) mg capsule, Take 1 capsule (220 mg total) by mouth daily., Disp: 90 capsule, Rfl: 3    Allergies  Cymbalta [duloxetine] and Keflex [cephalexin]    Family History  Family History   Problem Relation Age of Onset   ??? Breast cancer Cousin    ??? Cervical cancer Cousin    ??? Brain cancer Father         Brain tumor   ??? Hyperlipidemia Father    ??? Lupus Mother    ??? Hypertension Mother    ??? Fibromyalgia Mother    ??? Heart disease Mother         aortic valve replacement   ??? Cataracts Mother    ??? Leukemia Maternal Aunt    ??? Lupus Maternal Aunt    ??? Lupus Maternal Aunt        Social History  Social History     Tobacco Use   ??? Smoking status: Never Smoker   ??? Smokeless tobacco: Never Used   Substance Use Topics   ??? Alcohol use: No   ??? Drug use: No       Review of Systems:   Pertinent positives and negatives are documented as per the HPI, all other systems reviewed are negative.   Constitutional: Negative for fever.  Eyes: Negative for blurred vision.  ENT: Negative for congestion.  Cardiovascular: Negative for palpitations.  Respiratory: Negative for hemoptysis.  Gastrointestinal: Negative for nausea/vomiting.  Genitourinary: Negative for dysuria.  Musculoskeletal: Positive for right leg pain.  Skin: Positive for right leg swelling and redness.  Neurological: Negative for slurred speech.    Physical Exam     VITAL SIGNS:    ED Triage Vitals [12/29/18 0127]   Enc Vitals Group      BP 191/84      Heart Rate 69      SpO2 Pulse       Resp 20      Temp 36.7 ??C (98.1 ??F)      Temp src       SpO2 98 %      Weight (!) 146.5 kg (323 lb)      Height 1.702 m (5' 7)      Head Circumference       Peak Flow       Pain Score       Pain Loc       Pain Edu?       Excl. in GC?        Constitutional: Alert and oriented. Well appearing and in no distress.  Eyes: Conjunctivae are normal. PERRL  ENT       Head: Normocephalic and atraumatic.       Nose: No congestion.       Mouth/Throat: Mucous membranes are moist.       Neck: No stridor.  Hematological/Lymphatic/Immunilogical: No cervical lymphadenopathy.  Cardiovascular: Normal rate, regular rhythm. Normal and symmetric distal pulses are present in all extremities.  Respiratory: Normal respiratory effort. Breath sounds are normal.  Gastrointestinal: Soft, non tender, non distend, without rebound or guarding.  Musculoskeletal: Nontender with normal range of motion in all extremities.  Including full range and no significant pain in the right knee or hip as well as ankle.  Does have pain elicited in the calf with dorsa and plantar flexion of the right ankle.  Neurologic: Normal speech and language. No gross focal neurologic deficits are appreciated.  Skin: Erythema and edema mainly of the right calf with edema extending above the knee and down to the foot but the erythema localized to the back and medial aspect of the right calf.  Some orange skin changes suggestive of edema.    Labs     Labs Reviewed   POCT GLUCOSE, INTERFACED - Normal         Pertinent labs & imaging results that were available during my care of the patient were reviewed by me and considered in my medical decision making (see chart for details).     Jocelyn Lamer, MD  12/29/18 [phone]

## 2018-12-29 NOTE — Unmapped (Signed)
Having lower leg pain for a week. Pt able to bear weight. redness and swelling noted.

## 2019-01-01 NOTE — Unmapped (Signed)
both listed numbers out of service.

## 2019-01-02 NOTE — Unmapped (Signed)
Please click on Chart from this encounter, double click on this encounter from the chart,  and follow these instructions.     See Patient-Level Documents below. Locate recently scanned External Records labeled Print and Sign. Click hyperlink to view and print paperwork. Please complete within 2 business days.     Once completed, please place in nearest Internal Medicine outbox to be faxed.     Please let me know if I can do anything to help you. Thanks.

## 2019-01-03 NOTE — Unmapped (Signed)
6 attempts have been made by me and the [facility] to reach patient to set up her next refill of Aubagio. It is now patient's responsibility to call to set up. Will send letter via mail.    Victoria Kelly, PharmD, CPP  Clinical Pharmacist, [facility]  Phone: [phone]

## 2019-01-12 NOTE — Unmapped (Signed)
[facility] Refill Coordination Note    Specialty Medication(s) to be Shipped:   Neurology: [name]    Other medication(s) to be shipped: none     Victoria Kelly, DOB: 1971-01-25  Phone: [phone] (home)       All above HIPAA information was verified with patient.     Completed refill call assessment today to schedule patient's medication shipment from the [facility] [phone]).       Specialty medication(s) and dose(s) confirmed: Regimen is correct and unchanged.   Changes to medications: Victoria Kelly reports no changes reported at this time.  Changes to insurance: No  Questions for the pharmacist: No    Confirmed patient received Welcome Packet with first shipment. The patient will receive a drug information handout for each medication shipped and additional FDA Medication Guides as required.       DISEASE/MEDICATION-SPECIFIC INFORMATION        N/A    SPECIALTY MEDICATION ADHERENCE     Medication Adherence     Other adherence tool:  Patient has a bag of medicines that she takes before bed; has routine   Support network for adherence:  family member          Aubagio 14 mg: 7 days of medicine on hand        SHIPPING     Shipping address confirmed in Epic.     Delivery Scheduled: Yes, Expected medication delivery date: 01/16/19.     Medication will be delivered via Next Day Courier to the home address in [facility] [place].    Delaine Hernandez [facility] Specialty Pharmacist

## 2019-01-12 NOTE — Unmapped (Signed)
Tried to call patient back to help arrange refill of Aubagio but patient didn't answer. LVM and I urged patient to call the [facility] pharmacy, I left their phone number. Also advised that her upcoming appointment with [place] would most likely be a phone visit. I urged her to please ensure she answers her phone and if her phone number changes to let us and the pharmacy know right away.    [name], PharmD, CPP  Clinical Pharmacist, [facility]  Phone: [phone]

## 2019-01-12 NOTE — Unmapped (Signed)
-----   Message from [facility] [place], [place] sent at 01/12/2019 12:06 PM EDT -----  Regarding: RE: [name]  Go ahead and send out one month of Aubagio and I will set up local labs at her phone visit.  If you talk to her let her know that the visit will be converted to a phone visit and to not come to clinic.    What I am doing with my phone visit folks is only refilling DMT for one month and then once I get the labs refilling for longer.  That way there is some sort of check and balance in place in case they do not get the labs.    Thank you, Clara      ----- Message -----  From: [name], CPP  Sent: 01/12/2019  10:54 AM EDT  To: Cy Blamer, PA, #  Subject: Particia Lather Clara,    This patient has been unreachable for 2 months. She finally left me a voicemail message yesterday with a new phone number. The last time she filled her Aubagio was Jan 20th. I was wondering if you want [place] to proceed with getting her a refill or if you want to wait until her appointment with you in April? (last safety labs done in Oct 2019)    Thanks,  Judeth Cornfield

## 2019-01-15 MED FILL — AUBAGIO 14 MG TABLET: 30 days supply | Qty: 30 | Fill #4 | Status: AC

## 2019-01-15 MED FILL — AUBAGIO 14 MG TABLET: 30 days supply | Qty: 30 | Fill #4

## 2019-01-17 NOTE — Unmapped (Signed)
Please eprescribe pended medication. Pharmacy verified. Please let me know if I can do anything else to help you. Thank you.

## 2019-01-29 NOTE — Unmapped (Signed)
Please click on Chart from this encounter, double click on this encounter from the chart,  and follow these instructions.     See Patient-Level Documents below. Locate recently scanned External Records labeled Print and Sign. Click hyperlink to view and print paperwork. Please complete within 2 business days.     Once completed, please place in nearest Internal Medicine outbox to be faxed.     Please let me know if I can do anything to help you. Thanks.    [employer] is calling back because they are needing support notes for patient to receive back brace.

## 2019-02-05 ENCOUNTER — Encounter
Admit: 2019-02-05 | Discharge: 2019-02-06 | Payer: MEDICARE | Attending: Physician Assistant | Primary: Physician Assistant

## 2019-02-05 DIAGNOSIS — G35 Multiple sclerosis: Principal | ICD-10-CM

## 2019-02-05 DIAGNOSIS — N3281 Overactive bladder: Secondary | ICD-10-CM

## 2019-02-05 DIAGNOSIS — G4700 Insomnia, unspecified: Secondary | ICD-10-CM

## 2019-02-05 MED ORDER — TRAZODONE 50 MG TABLET
ORAL_TABLET | Freq: Every evening | ORAL | 1 refills | 0 days | Status: CP
Start: 2019-02-05 — End: ?

## 2019-02-05 MED ORDER — OXYBUTYNIN CHLORIDE ER 5 MG TABLET,EXTENDED RELEASE 24 HR
ORAL_TABLET | Freq: Every day | ORAL | 1 refills | 0 days | Status: CP
Start: 2019-02-05 — End: 2020-02-05

## 2019-02-05 NOTE — Unmapped (Signed)
**  Obtain local labs.  ** Obtain MRI's locally in August 2020.  **Continue Aubagio.  ** Follow up in 6 months.     ??   In case of:  ?? a suspected relapse (new symptoms or worsening existing symptoms, lasting for >24h)  OR  ?? a need for an additional appointment for other reasons     Please contact:    [facility] Neurology Call Center  Phone: [phone]   Fax: [phone]       [facility], Department of Neurology /  Multiple Sclerosis Division    [address]    [place], [place] [address]

## 2019-02-05 NOTE — Unmapped (Signed)
[facility] [place] [facility]  Multiple Sclerosis / Neuroimmunology Division  Victoria Kelly  Physician Assistant    Phone: [phone]  Fax: [phone]      Patient Name: Victoria Kelly   Date of Birth: 1971/09/22  Medical Record Number: [id number]  [address]  [place] [address]     Direct entry by:  Victoria Blamer, PA-C.  Supervising Physician: Dr. Desma Kelly.    DATE OF VISIT: February 05, 2019  2  REASON FOR PATIENT OUTREACH / PHONE CALL ENCOUNTER: Follow up for evaluation of Multiple Sclerosis.  Last seen in clinic 08/01/2018.    Start time 1334 , End time 1350 .  Patient location: home.     I spent 16 minutes on the phone with the patient. I spent an additional 15 minutes on pre- and post-visit activities.     The patient was physically located in [place] or a state in which I am permitted to provide care. The patient understood that s/he may incur co-pays and cost sharing, and agreed to the telemedicine visit. The visit was completed via phone and/or video, which was appropriate and reasonable under the circumstances given the patient's presentation at the time.    The patient has been advised of the potential risks and limitations of this mode of treatment (including, but not limited to, the absence of in-person examination) and has agreed to be treated using telemedicine. The patient's/patient's family's questions regarding telemedicine have been answered.     If the phone/video visit was completed in an ambulatory setting, the patient has also been advised to contact their provider???s office for worsening conditions, and seek emergency medical treatment and/or call 911 if the patient deems either necessary.    ASSESSMENT AND PLAN:  ** Relapsing Remitting Multiple Sclerosis:  -Started Aubagio 03/2017, refill.  -Check  CBC/d, LFT's and Vit D 25-OH externally.  -Schedule MRI of the  Brain, Cervical  and Thoracic spine without contrast.  Schedule for  05/2019 externally.  .  -Refill trazodone and oxybutynin. Hold Lyrica per patient request because I\she said that she can not tell if it is doing anything.    -Return in 6 months.    INTERVAL HISTORY / CHIEF COMPLAINT:  Patient reports that they are taking DMT appropriately and are not experiencing any side effects.    Since the last visit the patient states that they have not had any of the following:   -Clinical flare-ups or received IV methylprednisolone.    PRIOR HISTORY: A 48 y.o. African American female. She is here with her daughter and grandson 50 months and 50 month odf nephew.  Victoria Kelly has been diagnosed with MS in 2003.  Seen by Victoria Kelly on 01/16/2018. This is from her notes.  In 2003 she had left sided ON. Right sided ON occurred after a month. She now has color vision decrease. She also has a cataract, and did the surgery. In the following years she had an episode when she could not feel cold/warm on one side of her body. She got better but not completely. She further had an episode of right sided weakness and received steroids and recovered. 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   She was initially treated with Copaxone; however, discontinued in 2011 (per notes by Victoria Kelly).   Started with Aubagio at the end of June 2018.  MRI REVIEW:  03/01/2018  MRI of the brain  w / wo contrast compared to 10/26/2016: There are multiple white matter  foci of abnormal increased T2/FLAIR signal, unchanged. Lesions are again identified in the cerebellum and brainstem. No enhancing lesions are seen.    ??The optic nerves are normal in appearance.    03/01/2018 MRI of the cervical spine  w / wo  contrast compared to 10/26/2016: Unchanged is the T2 hyperintense lesion within the right aspect of the cord at the C3 level consistent with aplaque: T1-weighted images demonstrate hypointensity. No new or enhancing lesions. 03/01/2018 MRI of the thoracic spine  w / wo  contrast compared to 12/21/2011: No definitive spinal lesions identified. There is no abnormal enhancement. Unchanged is a small syrinx at the T10 level.    LUMBAR PUNCTURE:  Per notes by Victoria Kelly on 04/18/2007 'she had a negative lumbar puncture' but I have no results available for my review.     MS FLARE UP HISTORY:  Had last flare up in 2015 with reccurrent optic neurtis with left  eye.  Was evaluated in ER, treated with course of IV steroids at home.  Does not remember details.    MS MEDICATION:  Copaxone 2011.  Aubagio 03/2017.    GYN HISTORY HISTORY:  No LMP recorded. Patient has had a hysterectomy.    REVIEW OF SYSTEMS:  A 10-systems review was performed and, unless otherwise noted, declared negative by patient.    No visits with results within 1 Month(s) from this visit.   Latest known visit with results is:   Admission on 12/29/2018, Discharged on 12/29/2018   Component Date Value Ref Range Status   ??? Glucose, POC 12/29/2018 92  70 - 179 mg/dL Final       PROBLEM LIST:    Patient Active Problem List   Diagnosis   ??? Multiple sclerosis (CMS-HCC)   ??? Dysthymic disorder   ??? Obesity   ??? Obstructive sleep apnea   ??? Weakness of wrist   ??? Ankle arthritis   ??? Allergic rhinitis   ??? Insomnia   ??? Low back pain   ??? Iron deficiency anemia   ??? Acne   ??? H/O cataract removal with insertion of prosthetic lens   ??? Hyperlipidemia   ??? History of bariatric surgery   ??? Elevated serum creatinine   ??? Morbid (severe) obesity due to excess calories (CMS-HCC)   ??? S/P TAH (total abdominal hysterectomy)   ??? Rash     Current Outpatient Medications   Medication Sig Dispense Refill   ??? acetaminophen (TYLENOL) 500 MG tablet Take 2 tablets (1,000 mg total) by mouth every eight (8) hours as needed for pain. 30 tablet 0   ??? cholecalciferol, vitamin D3, (VITAMIN D3) 2,000 unit cap Take 4,000 Units by mouth daily.     ??? ergocalciferol (DRISDOL) 50,000 unit capsule Take 1 capsule (50,000 Units total) by mouth once a week. 24 capsule 0   ??? ferrous sulfate 325 (65 FE) MG tablet Take 1 tablet (325 mg total) by mouth daily with breakfast. (Patient not taking: Reported on 01/16/2018) 90 tablet 3   ??? fluticasone (FLONASE) 50 mcg/actuation nasal spray 2 sprays by Each Nare route daily. 16 g 6   ??? melatonin 5 mg tablet Take 5 mg by mouth nightly as needed.     ??? multivitamin (TAB-A-VITE/THERAGRAN) per tablet Take 1 tablet by mouth daily. (Patient not taking: Reported on 12/29/2018) 90 tablet 3   ??? oxybutynin (DITROPAN-XL)  5 MG 24 hr tablet Take 1 tablet (5 mg total) by mouth daily. In the morning. 90 tablet 1   ??? pantoprazole (PROTONIX) 40 MG tablet TAKE 1 TABLET BY MOUTH EVERY DAY 90 tablet 3   ??? PARoxetine (PAXIL) 30 MG tablet START TAKING 1 PILL A DAY, CAN INCREASE TO 2 PILLS A DAY AFTER 2-3 WEEKS. 180 tablet 1   ??? pregabalin (LYRICA) 75 MG capsule Take 1 capsule (75 mg total) by mouth Two (2) times a day. On Day 1 take 1 pill at bedtime. From Day 2 continue 1 pill two times a day. 180 capsule 1   ??? teriflunomide 14 mg Tab TAKE ONE TABLET DAILY - MUST COME TO APPOINTMENT FOR FUTURE FILLS 30 each 5   ??? traZODone (DESYREL) 50 MG tablet Take 1 tablet (50 mg total) by mouth nightly. (Patient not taking: Reported on 12/29/2018) 90 tablet 1   ??? triamcinolone (KENALOG) 0.1 % cream Apply topically Two (2) times a day. Do not apply to face (Patient not taking: Reported on 12/29/2018) 15 g 0   ??? zinc sulfate (ZINCATE) 220 (50) mg capsule Take 1 capsule (220 mg total) by mouth daily. 90 capsule 3     No current facility-administered medications for this visit.          Past Surgical Hx:    Past Surgical History:   Procedure Laterality Date   ??? ABDOMINAL HYSTERECTOMY  05/05/2006    Ovaries were kept inside   ??? BARIATRIC SURGERY  2014   ??? CATARACT EXTRACTION Bilateral 08/14/2003, 09/09/2003   ??? CESAREAN SECTION  01/12/2000   ??? DEBRIDEMENT LEG Left 09/30/1999    Spider bite   ??? ENDOMETRIAL ABLATION  02/2005   ??? ENDOMETRIAL ABLATION 02/11/2005   ??? OOPHORECTOMY     ??? PR LAP, GAST RESTRICT PROC, LONGITUDINAL GASTRECTOMY Midline 02/15/2014    Procedure: ROBOTIC PR LAP, GAST RESTRICT PROC, LONGITUDINAL GASTRECTOMY;  Surgeon: Aida Raider, MD;  Location: MAIN OR [place];  Service: Gastrointestinal   ??? TONSILLECTOMY  1982   ??? TOTAL ABDOMINAL HYSTERECTOMY     ??? TUBAL LIGATION  01/12/2000       Social Hx:    Social History     Socioeconomic History   ??? Marital status: Divorced     Spouse name: Not on file   ??? Number of children: 3   ??? Years of education: Not on file   ??? Highest education level: Not on file   Occupational History   ??? Occupation: Personal assistant: unknown   Social Needs   ??? Financial resource strain: Not on file   ??? Food insecurity     Worry: Not on file     Inability: Not on file   ??? Transportation needs     Medical: Not on file     Non-medical: Not on file   Tobacco Use   ??? Smoking status: Never Smoker   ??? Smokeless tobacco: Never Used   Substance and Sexual Activity   ??? Alcohol use: No   ??? Drug use: No   ??? Sexual activity: Not Currently   Lifestyle   ??? Physical activity     Days per week: Not on file     Minutes per session: Not on file   ??? Stress: Not on file   Relationships   ??? Social Wellsite geologist on phone: Not on file     Gets together: Not on file  Attends religious service: Not on file     Active member of club or organization: Not on file     Attends meetings of clubs or organizations: Not on file     Relationship status: Not on file   Other Topics Concern   ??? Do you use sunscreen? No   ??? Tanning bed use? No   ??? Are you easily burned? No   ??? Excessive sun exposure? No   ??? Blistering sunburns? Not Asked   Social History Narrative    Single, has 3 children, aged 31-25. Married in 1997, divorced in 2001. Lives with her boyfriend. On disability (r/t Multiple Sclerosis), previously employed as a Interior and spatial designer, bus Hospital doctor. Born and raised in [place]. Attended [employer] in 1993.    Attended [employer] and [employer], some college credits attained.       Family Hx:    Family History   Problem Relation Age of Onset   ??? Breast cancer Cousin    ??? Cervical cancer Cousin    ??? Brain cancer Father         Brain tumor   ??? Hyperlipidemia Father    ??? Lupus Mother    ??? Hypertension Mother    ??? Fibromyalgia Mother    ??? Heart disease Mother         aortic valve replacement   ??? Cataracts Mother    ??? Leukemia Maternal Aunt    ??? Lupus Maternal Aunt    ??? Lupus Maternal Aunt        ALLERGIES:    Allergies   Allergen Reactions   ??? Cymbalta [Duloxetine] Other (See Comments)     Metal taste in mouth     ??? Keflex [Cephalexin] Other (See Comments)     rash

## 2019-02-06 NOTE — Unmapped (Signed)
Lab orders faxed to [facility] in [place] per pt request.   p [phone]  f [phone]    Fax confirmation received at 02/05/19 @ 1416 AN

## 2019-02-19 NOTE — Unmapped (Signed)
[facility] Clinical Assessment & Refill Coordination Note    Victoria Kelly, DOB: 08-30-1971  Phone: [phone] (home)     All above HIPAA information was verified with patient.     Specialty Medication(s):   Neurology: [name]     Current Outpatient Medications   Medication Sig Dispense Refill   ??? acetaminophen (TYLENOL) 500 MG tablet Take 2 tablets (1,000 mg total) by mouth every eight (8) hours as needed for pain. 30 tablet 0   ??? cholecalciferol, vitamin D3, (VITAMIN D3) 2,000 unit cap Take 4,000 Units by mouth daily.     ??? ergocalciferol (DRISDOL) 50,000 unit capsule Take 1 capsule (50,000 Units total) by mouth once a week. 24 capsule 0   ??? ferrous sulfate 325 (65 FE) MG tablet Take 1 tablet (325 mg total) by mouth daily with breakfast. (Patient not taking: Reported on 01/16/2018) 90 tablet 3   ??? fluticasone (FLONASE) 50 mcg/actuation nasal spray 2 sprays by Each Nare route daily. 16 g 6   ??? melatonin 5 mg tablet Take 5 mg by mouth nightly as needed.     ??? multivitamin (TAB-A-VITE/THERAGRAN) per tablet Take 1 tablet by mouth daily. (Patient not taking: Reported on 12/29/2018) 90 tablet 3   ??? oxybutynin (DITROPAN-XL) 5 MG 24 hr tablet Take 1 tablet (5 mg total) by mouth daily. In the morning. 90 tablet 1   ??? pantoprazole (PROTONIX) 40 MG tablet TAKE 1 TABLET BY MOUTH EVERY DAY 90 tablet 3   ??? PARoxetine (PAXIL) 30 MG tablet START TAKING 1 PILL A DAY, CAN INCREASE TO 2 PILLS A DAY AFTER 2-3 WEEKS. 180 tablet 1   ??? pregabalin (LYRICA) 75 MG capsule Take 1 capsule (75 mg total) by mouth Two (2) times a day. On Day 1 take 1 pill at bedtime. From Day 2 continue 1 pill two times a day. 180 capsule 1   ??? teriflunomide 14 mg Tab TAKE ONE TABLET DAILY - MUST COME TO APPOINTMENT FOR FUTURE FILLS 30 each 5   ??? traZODone (DESYREL) 50 MG tablet Take 1 tablet (50 mg total) by mouth nightly. 90 tablet 1   ??? zinc sulfate (ZINCATE) 220 (50) mg capsule Take 1 capsule (220 mg total) by mouth daily. 90 capsule 3     No current facility-administered medications for this visit.         Changes to medications: Illana reports no changes at this time.    Allergies   Allergen Reactions   ??? Cymbalta [Duloxetine] Other (See Comments)     Metal taste in mouth     ??? Keflex [Cephalexin] Other (See Comments)     rash       Changes to allergies: No    SPECIALTY MEDICATION ADHERENCE     Aubagio 14 mg: 7 days of medicine on hand       Medication Adherence    Patient reported X missed doses in the last month:  0  Specialty Medication:  Aubagio  Patient is on additional specialty medications:  No   Other adherence tool:  Patient has a bag of medicines that she takes before bed; has routine   Support network for adherence:  family member          Specialty medication(s) dose(s) confirmed: Regimen is correct and unchanged.     Are there any concerns with adherence? No    Adherence counseling provided? Not needed    CLINICAL MANAGEMENT AND INTERVENTION  Clinical Benefit Assessment:    Do you feel the medicine is effective or helping your condition? Yes    Clinical Benefit counseling provided? Not needed    Adverse Effects Assessment:    Are you experiencing any side effects? No    Are you experiencing difficulty administering your medicine? No    Quality of Life Assessment:    How many days over the past month did your MS  keep you from your normal activities? For example, brushing your teeth or getting up in the morning. 0    Have you discussed this with your provider? Not needed    Therapy Appropriateness:    Is therapy appropriate? Yes, therapy is appropriate and should be continued    DISEASE/MEDICATION-SPECIFIC INFORMATION      N/A    PATIENT SPECIFIC NEEDS     ? Does the patient have any physical, cognitive, or cultural barriers? No    ? Is the patient high risk? No     ? Does the patient require a Care Management Plan? No     ? Does the patient require physician intervention or other additional services (i.e. nutrition, smoking cessation, social work)? No      SHIPPING     Specialty Medication(s) to be Shipped:   Neurology: Aubagio    Other medication(s) to be shipped: none     Changes to insurance: No    Delivery Scheduled: Yes, Expected medication delivery date: 02/22/19.     Medication will be delivered via Next Day Courier to the confirmed home address in [facility].    The patient will receive a drug information handout for each medication shipped and additional FDA Medication Guides as required.  Verified that patient has previously received a Conservation officer, historic buildings.    Arnold Long   [facility] Specialty Pharmacist

## 2019-02-21 MED FILL — AUBAGIO 14 MG TABLET: 30 days supply | Qty: 30 | Fill #5 | Status: AC

## 2019-02-21 MED FILL — AUBAGIO 14 MG TABLET: 30 days supply | Qty: 30 | Fill #5

## 2019-03-15 NOTE — Unmapped (Signed)
[facility] Refill Coordination Note    Specialty Medication(s) to be Shipped:   Neurology: [name]    Other medication(s) to be shipped: n/a     Victoria Kelly, DOB: 06-Jun-1971  Phone: [phone] (home)       All above HIPAA information was verified with patient.     Completed refill call assessment today to schedule patient's medication shipment from the [facility] [phone]).       Specialty medication(s) and dose(s) confirmed: Regimen is correct and unchanged.   Changes to medications: Victoria Kelly reports no changes at this time.  Changes to insurance: No  Questions for the pharmacist: No    Confirmed patient received Welcome Packet with first shipment. The patient will receive a drug information handout for each medication shipped and additional FDA Medication Guides as required.       DISEASE/MEDICATION-SPECIFIC INFORMATION        N/A    SPECIALTY MEDICATION ADHERENCE     Medication Adherence    Patient reported X missed doses in the last month:  0  Specialty Medication:  Aubagio  Patient is on additional specialty medications:  No  Patient is on more than two specialty medications:  No  Any gaps in refill history greater than 2 weeks in the last 3 months:  no  Demonstrates understanding of importance of adherence:  yes  Informant:  patient   Other adherence tool:  Patient has a bag of medicines that she takes before bed; has routine   Support network for adherence:  family member                Aubagio 14mg : Patient has 7 days of medication on hand       SHIPPING     Shipping address confirmed in Epic.     Delivery Scheduled: Yes, Expected medication delivery date: 03/21/19.  However, Rx request for refills was sent to the provider as there are none remaining.     Medication will be delivered via Same Day Courier to the home address in [place].    Victoria Kelly   [facility] Specialty Technician

## 2019-03-15 NOTE — Unmapped (Signed)
Last clinic visit 08/01/2018  No scheduled visit.

## 2019-03-16 MED ORDER — TERIFLUNOMIDE 14 MG TABLET
0 refills | 0 days | Status: CP
Start: 2019-03-16 — End: 2020-03-15
  Filled 2019-03-21: qty 30, 30d supply, fill #0

## 2019-03-20 NOTE — Unmapped (Signed)
-----   Message from [facility] [place], [place] sent at 03/16/2019  8:43 PM EDT -----  Regarding: labs  Please call patient and remind her to get her labs done.    Thank you, Clara

## 2019-03-20 NOTE — Unmapped (Signed)
Pt states that she has not had her labs done but will have them done on Thursday. Her daughter has to take her and this is the only day that she has off.

## 2019-03-21 MED FILL — AUBAGIO 14 MG TABLET: 30 days supply | Qty: 30 | Fill #0 | Status: AC

## 2019-04-10 NOTE — Unmapped (Signed)
Pharmacy requesting refill.

## 2019-04-12 MED ORDER — PANTOPRAZOLE 40 MG TABLET,DELAYED RELEASE
ORAL_TABLET | Freq: Every day | ORAL | 3 refills | 0 days | Status: CP
Start: 2019-04-12 — End: ?

## 2019-04-12 NOTE — Unmapped (Signed)
Received call from [facility] stating they will be delivering patient medications for this patient and asking that they be added to the patient's profile. I called the patient who confirmed this. Pharmacy has been added.

## 2019-04-13 MED ORDER — CHOLECALCIFEROL (VITAMIN D3) 50 MCG (2,000 UNIT) CAPSULE
ORAL_CAPSULE | Freq: Every day | ORAL | 5 refills | 0.00000 days | Status: CP
Start: 2019-04-13 — End: ?

## 2019-04-13 NOTE — Unmapped (Signed)
Faxed refill request received from pharmacy: [facility]    Provider: Elvera Bicker    Last Visit Date: 4.20.2020     Next Visit Date: 10.6.2020    Prescription included Zinc 220 mg 90 day supply. Take 1 capsul 1 time per day.  Not found on medication list.

## 2019-04-17 NOTE — Unmapped (Signed)
[facility] Refill Coordination Note    Specialty Medication(s) to be Shipped:   Neurology: [name]    Other medication(s) to be shipped: n/a     TAWANIA DAPONTE, DOB: 1971-08-07  Phone: [phone] (home)       All above HIPAA information was verified with patient.     Completed refill call assessment today to schedule patient's medication shipment from the [facility] [phone]).       Specialty medication(s) and dose(s) confirmed: Regimen is correct and unchanged.   Changes to medications: Amorah reports no changes at this time.  Changes to insurance: No  Questions for the pharmacist: No    Confirmed patient received Welcome Packet with first shipment. The patient will receive a drug information handout for each medication shipped and additional FDA Medication Guides as required.       DISEASE/MEDICATION-SPECIFIC INFORMATION        N/A    SPECIALTY MEDICATION ADHERENCE     Medication Adherence    Patient reported X missed doses in the last month:  0  Specialty Medication:  Aubagio  Patient is on additional specialty medications:  No  Patient is on more than two specialty medications:  No  Any gaps in refill history greater than 2 weeks in the last 3 months:  no  Demonstrates understanding of importance of adherence:  yes  Informant:  patient   Other adherence tool:  Patient has a bag of medicines that she takes before bed; has routine   Support network for adherence:  family member                Aubagio 14mg : Patient has 7 days of medication on hand      SHIPPING     Shipping address confirmed in Epic.     Delivery Scheduled: Yes, Expected medication delivery date: 04/19/19.  However, Rx request for refills was sent to the provider as there are none remaining.     Medication will be delivered via Same Day Courier to the home address in Epic [place].    [name]   [facility] Specialty Technician

## 2019-04-17 NOTE — Unmapped (Signed)
Patient states she has not made it to [facility] in [place] to have done. Will call them to schedule appointment and have them fax results once complete.

## 2019-04-17 NOTE — Unmapped (Signed)
??        Please call patient and see if she completed her labs from 02/05/2019 and if so obtain results.     Thank you, Victoria Kelly

## 2019-04-18 MED ORDER — PREGABALIN 75 MG CAPSULE
ORAL_CAPSULE | Freq: Two times a day (BID) | ORAL | 1 refills | 0.00000 days | Status: CP
Start: 2019-04-18 — End: 2020-04-17

## 2019-04-18 NOTE — Unmapped (Signed)
I called the patient to enquire if she hd gone to get her labs drawn today. I was unable to reach her or leave a message.

## 2019-04-18 NOTE — Unmapped (Signed)
PATIENT NEEDS SAFETY LABS FIRST for refills.

## 2019-04-18 NOTE — Unmapped (Signed)
Faxed refill request received from pharmacy: [facility]    Provider: Elvera Bicker    Last Visit Date: 4.20.2020     Next Visit Date: 10.6.2020

## 2019-04-19 NOTE — Unmapped (Signed)
Victoria Kelly 's Aubagio shipment will be delayed due to No refills. Refill request denied due to patient needing labs. We have contacted the patient and tried contacting pt, home phone disconnected We will call the patient to reschedule the delivery upon resolution.

## 2019-04-24 NOTE — Unmapped (Signed)
Called pt to follow up on labs but neither phone number was in working order. AN

## 2019-04-25 NOTE — Unmapped (Signed)
-----   Message from Rodman Comp, CMA sent at 04/25/2019 11:53 AM EDT -----  Regarding: RE: labs from 02/05/2019  I was unable to reach pt at the number listed. It says not accepting calls. I did call [facility] and there are no results. I will have a letter sent out to her.     Sue Lush  ----- Message -----  From: Cy Blamer, PA  Sent: 04/23/2019   9:13 AM EDT  To: Rodman Comp, CMA  Subject: labs from 02/05/2019                             Please call patient and find out if she had her labs drawn from 02/05/2019 and obtain results.  If not please verify that the orders have been sent and encourage her to complete them.    Thank you, Raef Sprigg

## 2019-04-26 NOTE — Unmapped (Signed)
I contacted pt's pharmacy after attempting to contact pt at all numbers listed in her chart regarding labs. It seems that none of the numbers are in service. I also called [facility] who said that they did not have any lab results for the pt. I called pt's pharmacy, [facility] to see if they had an updated contact number for the pt and was given [phone]. I called the number and it just rang. I was not able to leave a message due to no vm.

## 2019-05-21 NOTE — Unmapped (Signed)
The [facility] has made a third and final attempt to reach this patient to refill the following medication:Aubagio.      We have left voicemails on the following phone numbers: [phone], [phone].    Dates contacted: 07/23, 07/28, 08/03  Last scheduled delivery: 06/03    The patient may be at risk of non-compliance with this medication. The patient should call the [facility] at [phone] (option 4) to refill medication.    [name]   [facility] Specialty Technician

## 2019-05-30 NOTE — Unmapped (Signed)
Called this patient to encourage her to get her safety labs done as soon as possible so that her [name] can be prescribed. Patient stated she can go to [facility] in [place] on [address].

## 2019-05-30 NOTE — Unmapped (Signed)
[facility] Refill Coordination Note    Specialty Medication(s) to be Shipped:   Neurology: [name]    Other medication(s) to be shipped: none     ANGIE PIERCEY, DOB: Sep 25, 1971  Phone: [phone] (home)       All above HIPAA information was verified with patient.     Completed refill call assessment today to schedule patient's medication shipment from the [facility] [phone]).       Specialty medication(s) and dose(s) confirmed: Regimen is correct and unchanged.   Changes to medications: Quanta reports no changes reported at this time.  Changes to insurance: No  Questions for the pharmacist: No    Confirmed patient received Welcome Packet with first shipment. The patient will receive a drug information handout for each medication shipped and additional FDA Medication Guides as required.       DISEASE/MEDICATION-SPECIFIC INFORMATION        N/A    SPECIALTY MEDICATION ADHERENCE     Medication Adherence    Patient reported X missed doses in the last month: 0  Specialty Medication: Aubagio  Patient is on additional specialty medications: No   Other adherence tool: Patient has a bag of medicines that she takes before bed; has routine   Support network for adherence: family member                Aubagio 14 mg: ~7 days of medicine on hand         SHIPPING     Shipping address confirmed in Epic.     Delivery Scheduled: Yes, Expected medication delivery date: 06/04/19.  However, Rx request for refills was sent to the provider as there are none remaining.     Medication will be delivered via UPS to the home address in Epic WAM.    Arnold Long   [facility] Specialty Pharmacist

## 2019-05-30 NOTE — Unmapped (Signed)
Last Visit Date: 02/05/19  Next Visit Date: 07/24/2019    Lab Results   Component Value Date    HIV Antigen/Antibody Combo Nonreactive 02/10/2012        No results found for this or any previous visit.    No results found for this or any previous visit.    No results found for this or any previous visit.

## 2019-05-31 NOTE — Unmapped (Signed)
Received Aubagio refill request.  Last safety labs done on 08/01/2018.     Will refill after reviewing safety labs, to protect patient's safety.   Tennis Must asked to call the patient. Mychart message sent to the patient. Lab orders have been already placed in 01/2019 but staff was unable to reach out to the patient per Epic.     Sharmon Revere Dujmovic Basuroski

## 2019-06-01 NOTE — Unmapped (Signed)
Last Visit Date: 02/05/19  Next Visit Date: 07/24/2019    Lab Results   Component Value Date    HIV Antigen/Antibody Combo Nonreactive 02/10/2012        No results found for this or any previous visit.    No results found for this or any previous visit.    No results found for this or any previous visit.

## 2019-06-01 NOTE — Unmapped (Signed)
Victoria Kelly 's AUBAGIO shipment will be delayed as a result of no refills remain on the prescription.      I have reached out to the patient and was unable to leave a message. We will call the patient back to reschedule the delivery upon resolution. We have not confirmed the new delivery date.

## 2019-06-03 NOTE — Unmapped (Signed)
-----   Message from Rodman Comp, CMA sent at 06/01/2019  1:58 PM EDT -----  Regarding: RE: needs lab orders faxed.  I was unable to contact the pt by phone. I couldn't leave a message at any of the numbers so I have mailed the pt a letter regarding her labs and the location.     Thank you,   Sue Lush  ----- Message -----  From: Cy Blamer, PA  Sent: 05/30/2019  10:18 PM EDT  To: Armandina Gemma, RN, Rodman Comp, CMA  Subject: needs lab orders faxed.                          Sue Lush,  Please assist in setting up external labs that are entered in Epic with today's date and have results faxed to me.    Patient is going to [employer] in [place] on [address].  .      Thank you, Nathaneil Feagans

## 2019-06-22 ENCOUNTER — Ambulatory Visit: Payer: 59 | Admitting: Podiatry

## 2019-07-02 NOTE — Unmapped (Unsigned)
The [facility] has made a third and final attempt to reach this patient to refill the following medication:Aubagio.      We have been unable to leave messages on the following phone numbers: [phone], [phone].    Dates contacted: 9/4, 9/9, 9/14  Last scheduled delivery: 6/3    The patient may be at risk of non-compliance with this medication. The patient should call the [facility] at [phone] (option 4) to refill medication.    [name]   [facility] Specialty Technician

## 2019-07-05 DIAGNOSIS — G35 Multiple sclerosis: Secondary | ICD-10-CM

## 2019-07-05 DIAGNOSIS — G4700 Insomnia, unspecified: Secondary | ICD-10-CM

## 2019-07-05 DIAGNOSIS — N3281 Overactive bladder: Secondary | ICD-10-CM

## 2019-07-06 NOTE — Unmapped (Signed)
Request received via interface.     Provider: Yolande Jolly    Last Visit Date: 02/05/2019     Next Visit Date: 07/24/2019

## 2019-07-08 MED ORDER — TRAZODONE 50 MG TABLET
ORAL_TABLET | Freq: Every evening | ORAL | 1 refills | 90.00000 days | Status: CP
Start: 2019-07-08 — End: 2019-07-24

## 2019-07-08 MED ORDER — OXYBUTYNIN CHLORIDE ER 5 MG TABLET,EXTENDED RELEASE 24 HR
ORAL_TABLET | 1 refills | 0 days | Status: CP
Start: 2019-07-08 — End: ?

## 2019-07-24 ENCOUNTER — Encounter
Admit: 2019-07-24 | Discharge: 2019-07-25 | Payer: MEDICARE | Attending: Physician Assistant | Primary: Physician Assistant

## 2019-07-24 DIAGNOSIS — G4700 Insomnia, unspecified: Secondary | ICD-10-CM

## 2019-07-24 DIAGNOSIS — G35 Multiple sclerosis: Secondary | ICD-10-CM

## 2019-07-24 LAB — CBC W/ AUTO DIFF
BASOPHILS ABSOLUTE COUNT: 0 10*9/L (ref 0.0–0.1)
BASOPHILS RELATIVE PERCENT: 0.7 %
EOSINOPHILS ABSOLUTE COUNT: 0.1 10*9/L (ref 0.0–0.4)
EOSINOPHILS RELATIVE PERCENT: 2.1 %
HEMATOCRIT: 39.4 % (ref 36.0–46.0)
HEMOGLOBIN: 12.7 g/dL (ref 12.0–16.0)
LARGE UNSTAINED CELLS: 3 % (ref 0–4)
LYMPHOCYTES ABSOLUTE COUNT: 1.6 10*9/L (ref 1.5–5.0)
LYMPHOCYTES RELATIVE PERCENT: 31.5 %
MEAN CORPUSCULAR HEMOGLOBIN: 29.5 pg (ref 26.0–34.0)
MEAN CORPUSCULAR VOLUME: 91.8 fL (ref 80.0–100.0)
MEAN PLATELET VOLUME: 8.9 fL (ref 7.0–10.0)
NEUTROPHILS ABSOLUTE COUNT: 3 10*9/L (ref 2.0–7.5)
NEUTROPHILS RELATIVE PERCENT: 58 %
PLATELET COUNT: 253 10*9/L (ref 150–440)
RED BLOOD CELL COUNT: 4.29 10*12/L (ref 4.00–5.20)
RED CELL DISTRIBUTION WIDTH: 14.6 % (ref 12.0–15.0)
WBC ADJUSTED: 5.2 10*9/L (ref 4.5–11.0)

## 2019-07-24 LAB — ALBUMIN: Albumin:MCnc:Pt:Ser/Plas:Qn:: 3.2 — ABNORMAL LOW

## 2019-07-24 LAB — HEPATIC FUNCTION PANEL
ALBUMIN: 3.2 g/dL — ABNORMAL LOW (ref 3.5–5.0)
ALKALINE PHOSPHATASE: 75 U/L (ref 38–126)
AST (SGOT): 21 U/L (ref 14–38)
BILIRUBIN DIRECT: 0.3 mg/dL (ref 0.00–0.40)

## 2019-07-24 LAB — EOSINOPHILS RELATIVE PERCENT: Eosinophils/100 leukocytes:NFr:Pt:Bld:Qn:Automated count: 2.1

## 2019-07-24 NOTE — Unmapped (Signed)
To schedule your MRI's please call:  [phone], option 1.    Continue Aubagio.  Recomend flu vaccine.  Follow up in 6 months, video.     ??   In case of:  ?? a suspected relapse (new symptoms or worsening existing symptoms, lasting for >24h)  OR  ?? a need for an additional appointment for other reasons     Please contact:    [facility] Neurology Call Center  Phone: [phone]   Fax: [phone]       [facility], Department of Neurology /  Multiple Sclerosis Division    [address]    [place], [place] [address]

## 2019-07-24 NOTE — Unmapped (Signed)
[facility]  Multiple Sclerosis / Neuroimmunology Division  Victoria Kelly Victoria [facility]  Physician Assistant    Phone: [phone]  Fax: [phone]      Patient Name: Victoria Kelly   Date of Birth: [birth year]  Medical Record Number: [id number]  [address]  [place] [address]     Direct entry by:  Victoria Blamer, PA-C.  Supervising Physician: Dr. Desma Kelly.    DATE OF VISIT: July 24, 2019    REASON FOR VISIT: Followup in the Neuroimmunology Clinic for evaluation of Multiple Sclerosis.      ASSESSMENT AND PLAN:  ** Relapsing Remitting Multiple Sclerosis:  -Started Aubagio 03/2017,   -Check  CBC/d, LFT's and Vit D 25-OH.   -Reorder  MRI of the  Brain, Cervical  and Thoracic spine without contrast. with sedation due to claustrophobia.    **Vitamin Deficiency: Today's value Vitamin D 25-OH = 6.1ng/mL. Start Vitamin D3 50,000 units weekly for 6 months and than 4,000 units daily.  Letter  / My chart message sent to patient and RX sent to pharmacy.    **Contiue Oxybutynin.  -Follow up in 6 months , video.    Total visit time =   25   Minutes. 1448 / 1513  Greater than 50% of the face to face time was spent in consultation and treatment planning on the  disease process, medication, dosing and side effects.    NTERVAL HISTORY / CHIEF COMPLAINT :  Patient reports no new neurological symptoms and no MS relapses since the last visit on 02/05/19.  Did not get labs or MRI's done as instructed at the last visit.    PRIOR HISTORY: A 48 y.o. African American female. She is here with her daughter and grandson 87 months and 78 month odf nephew.  [name] has been diagnosed with MS in 2003.  Seen by Dr. Johnnye Kelly on 01/16/2018. This is from her notes.  In 2003 she had left sided ON. Right sided ON occurred after a month. She now has color vision decrease. She also has a cataract, and did the surgery. In the following years she had an episode when she could not feel cold/warm on one side of her body. She got better but not completely. She further had an episode of right sided weakness and received steroids and recovered. 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   She was initially treated with Copaxone; however, discontinued in 2011 (per notes by Dr. Hunt Kelly).   Started with Aubagio at the end of June 2018.     MRI REVIEW:  03/01/2018  MRI of the brain  w / wo contrast compared to 10/26/2016: There are multiple white matter  foci of abnormal increased T2/FLAIR signal, unchanged. Lesions are again identified in the cerebellum and brainstem. No enhancing lesions are seen.    ??The optic nerves are normal in appearance.    03/01/2018 MRI of the cervical spine  w / wo  contrast compared to 10/26/2016: Unchanged is the T2 hyperintense lesion within the right aspect of the cord at the C3 level consistent with aplaque: T1-weighted images demonstrate hypointensity. No new or enhancing lesions.    03/01/2018 MRI of the thoracic spine  w / wo  contrast compared to 12/21/2011: No definitive spinal lesions identified. There is no abnormal enhancement. Unchanged is a small syrinx at the T10 level.  LUMBAR PUNCTURE:  Per notes by Dr. Alessandra Kelly on 04/18/2007 'she had a negative lumbar puncture' but I have no results available for my review.     MS FLARE UP HISTORY:  Had last flare up in 2015 with reccurrent optic neurtis with left  eye.  Was evaluated in ER, treated with course of IV steroids at home.  Does not remember details.    MS MEDICATION:  Copaxone 2011.  Aubagio 03/2017.    GYN HISTORY HISTORY:  No LMP recorded. Patient has had a hysterectomy.    REVIEW OF SYSTEMS:  A 10-systems review was performed and, unless otherwise noted, declared negative by patient.    Office Visit on 07/24/2019   Component Date Value Ref Range Status   ??? Vitamin D Total (25OH) 07/24/2019 6.1* 20.0 - 80.0 ng/mL Final   ??? Albumin 07/24/2019 3.2* 3.5 - 5.0 g/dL Final   ??? Total Protein 07/24/2019 5.9* 6.5 - 8.3 g/dL Final   ??? Total Bilirubin 07/24/2019 0.6  0.0 - 1.2 mg/dL Final   ??? Bilirubin, Direct 07/24/2019 0.30  0.00 - 0.40 mg/dL Final   ??? AST 16/07/9603 21  14 - 38 U/L Final   ??? ALT 07/24/2019 13  <35 U/L Final   ??? Alkaline Phosphatase 07/24/2019 75  38 - 126 U/L Final   ??? WBC 07/24/2019 5.2  4.5 - 11.0 10*9/L Final   ??? RBC 07/24/2019 4.29  4.00 - 5.20 10*12/L Final   ??? HGB 07/24/2019 12.7  12.0 - 16.0 g/dL Final   ??? HCT 54/06/8118 39.4  36.0 - 46.0 % Final   ??? MCV 07/24/2019 91.8  80.0 - 100.0 fL Final   ??? MCH 07/24/2019 29.5  26.0 - 34.0 pg Final   ??? MCHC 07/24/2019 32.1  31.0 - 37.0 g/dL Final   ??? RDW 14/78/2956 14.6  12.0 - 15.0 % Final   ??? MPV 07/24/2019 8.9  7.0 - 10.0 fL Final   ??? Platelet 07/24/2019 253  150 - 440 10*9/L Final   ??? Neutrophils % 07/24/2019 58.0  % Final   ??? Lymphocytes % 07/24/2019 31.5  % Final   ??? Monocytes % 07/24/2019 4.9  % Final   ??? Eosinophils % 07/24/2019 2.1  % Final   ??? Basophils % 07/24/2019 0.7  % Final   ??? Absolute Neutrophils 07/24/2019 3.0  2.0 - 7.5 10*9/L Final   ??? Absolute Lymphocytes 07/24/2019 1.6  1.5 - 5.0 10*9/L Final   ??? Absolute Monocytes 07/24/2019 0.3  0.2 - 0.8 10*9/L Final   ??? Absolute Eosinophils 07/24/2019 0.1  0.0 - 0.4 10*9/L Final   ??? Absolute Basophils 07/24/2019 0.0  0.0 - 0.1 10*9/L Final   ??? Large Unstained Cells 07/24/2019 3  0 - 4 % Final   ??? Hypochromasia 07/24/2019 Slight* Not Present Final       No visits with results within 1 Month(s) from this visit.   Latest known visit with results is:   Admission on 12/29/2018, Discharged on 12/29/2018   Component Date Value Ref Range Status   ??? Glucose, POC 12/29/2018 92  70 - 179 mg/dL Final       PROBLEM LIST:    Patient Active Problem List   Diagnosis   ??? Multiple sclerosis (CMS-HCC)   ??? Dysthymic disorder   ??? Obesity   ??? Obstructive sleep apnea   ??? Weakness of wrist   ??? Ankle arthritis   ??? Allergic rhinitis   ??? Insomnia   ??? Low back  pain ??? Iron deficiency anemia   ??? Acne   ??? H/O cataract removal with insertion of prosthetic lens   ??? Hyperlipidemia   ??? History of bariatric surgery   ??? Elevated serum creatinine   ??? Morbid (severe) obesity due to excess calories (CMS-HCC)   ??? S/P TAH (total abdominal hysterectomy)   ??? Rash     Current Outpatient Medications   Medication Sig Dispense Refill   ??? acetaminophen (TYLENOL) 500 MG tablet Take 2 tablets (1,000 mg total) by mouth every eight (8) hours as needed for pain. 30 tablet 0   ??? cholecalciferol, vitamin D3, (VITAMIN D3) 50 mcg (2,000 unit) cap Take 2 capsules (4,000 Units total) by mouth daily. 60 capsule 5   ??? ferrous sulfate 325 (65 FE) MG tablet Take 1 tablet (325 mg total) by mouth daily with breakfast. 90 tablet 3   ??? fluticasone (FLONASE) 50 mcg/actuation nasal spray 2 sprays by Each Nare route daily. 16 g 6   ??? melatonin 5 mg tablet Take 5 mg by mouth nightly as needed.     ??? multivitamin (TAB-A-VITE/THERAGRAN) per tablet Take 1 tablet by mouth daily. 90 tablet 3   ??? oxybutynin (DITROPAN-XL) 5 MG 24 hr tablet TAKE 1 TABLET BY MOUTH EVERY MORNING 90 tablet 1   ??? pantoprazole (PROTONIX) 40 MG tablet Take 1 tablet (40 mg total) by mouth daily. 90 tablet 3   ??? PARoxetine (PAXIL) 30 MG tablet START TAKING 1 PILL A DAY, CAN INCREASE TO 2 PILLS A DAY AFTER 2-3 WEEKS. 180 tablet 1   ??? pregabalin (LYRICA) 75 MG capsule Take 1 capsule (75 mg total) by mouth Two (2) times a day. 180 capsule 1   ??? teriflunomide 14 mg Tab TAKE ONE TABLET BY MOUTH DAILY - MUST COME TO APPOINTMENT FOR FUTURE FILLS 30 each 0     No current facility-administered medications for this visit.          Past Surgical Hx:    Past Surgical History:   Procedure Laterality Date   ??? ABDOMINAL HYSTERECTOMY  05/05/2006    Ovaries were kept inside   ??? BARIATRIC SURGERY  2014   ??? CATARACT EXTRACTION Bilateral 08/14/2003, 09/09/2003   ??? CESAREAN SECTION  01/12/2000   ??? DEBRIDEMENT LEG Left 09/30/1999    Spider bite   ??? ENDOMETRIAL ABLATION 02/2005   ??? ENDOMETRIAL ABLATION  02/11/2005   ??? OOPHORECTOMY     ??? PR LAP, GAST RESTRICT PROC, LONGITUDINAL GASTRECTOMY Midline 02/15/2014    Procedure: ROBOTIC PR LAP, GAST RESTRICT PROC, LONGITUDINAL GASTRECTOMY;  Surgeon: Aida Raider, MD;  Location: MAIN OR [place];  Service: Gastrointestinal   ??? TONSILLECTOMY  1982   ??? TOTAL ABDOMINAL HYSTERECTOMY     ??? TUBAL LIGATION  01/12/2000       Social Hx:    Social History     Socioeconomic History   ??? Marital status: Divorced     Spouse name: None   ??? Number of children: 3   ??? Years of education: None   ??? Highest education level: None   Occupational History   ??? Occupation: Personal assistant: unknown   Social Needs   ??? Financial resource strain: None   ??? Food insecurity     Worry: None     Inability: None   ??? Transportation needs     Medical: None     Non-medical: None   Tobacco Use   ??? Smoking status: Never Smoker   ???  Smokeless tobacco: Never Used   Substance and Sexual Activity   ??? Alcohol use: No   ??? Drug use: No   ??? Sexual activity: Not Currently   Lifestyle   ??? Physical activity     Days per week: None     Minutes per session: None   ??? Stress: None   Relationships   ??? Social Wellsite geologist on phone: None     Gets together: None     Attends religious service: None     Active member of club or organization: None     Attends meetings of clubs or organizations: None     Relationship status: None   Other Topics Concern   ??? Do you use sunscreen? No   ??? Tanning bed use? No   ??? Are you easily burned? No   ??? Excessive sun exposure? No   ??? Blistering sunburns? Not Asked   Social History Narrative    Single, has 3 children, aged 50-25. Married in 1997, divorced in 2001. Lives with her boyfriend. On disability (r/t Multiple Sclerosis), previously employed as a Interior and spatial designer, bus Hospital doctor. Born and raised in [place]. Attended [employer] in 1993.    Attended [employer] and [employer], some college credits attained.       Family Hx:    Family History Problem Relation Age of Onset   ??? Breast cancer Cousin    ??? Cervical cancer Cousin    ??? Brain cancer Father         Brain tumor   ??? Hyperlipidemia Father    ??? Lupus Mother    ??? Hypertension Mother    ??? Fibromyalgia Mother    ??? Heart disease Mother         aortic valve replacement   ??? Cataracts Mother    ??? Leukemia Maternal Aunt    ??? Lupus Maternal Aunt    ??? Lupus Maternal Aunt        ALLERGIES:    Allergies   Allergen Reactions   ??? Cymbalta [Duloxetine] Other (See Comments)     Metal taste in mouth     ??? Keflex [Cephalexin] Other (See Comments)     rash     BP 139/98 (BP Site: L Arm, BP Position: Sitting, BP Cuff Size: X-Large)  - Pulse 66  - Ht 170.2 cm (5' 7)  - Wt (!) 146.5 kg (323 lb)  - BMI 50.59 kg/m??     PHYSICAL EXAMINATION:  GENERAL:  Alert and oriented to person, place, time and situation.  Obese.  Recent and remote memory intact.      Neurological Examination:   Cranial Nerves:   II, III- Pupils are equal 3 mm and reactive to light b/l.  III, IV, VI- extra ocular movements are intact, No ptosis, no nystagmus.  V- sensation of the face intact b/l.  VII- face symmetrical, no facial droop, normal facial movements with smile/grimace  VIII- Hearing grossly intact.  IX and X- symmetric palate contraction, normal gag bilaterally  XI- Full shoulder shrug bilaterally  XII- Tongue protrudes midline, full range of movements of the tongue    Motor Exam:      Muscles UEs   LEs     R L   R L   Deltoids 5/5 5/5 Hip flexors  5/5 5/5   Biceps 5/5 5/5 Hip extensors 5/5 5/5   Triceps 5/5 5/5 Knee flexors 5/5 5/5   Hand grip 5/5 5/5  Knee extensors 5/5 5/5      Foot dorsal flexors 5/5 5/5      Foot plantar flexors 5/5 5/5      Normal bulk and tone.  No clonus.    Reflexes R L   Brachioradialis +2 +2   Biceps +2 +2   Triceps +2 +2   Patella +2 +2   Achilles +1 +1     Negative babinski.    Sensory UEs LEs    R L R L   Light touch WNL WNL Decreased at the foot. WNL   Pin prick WNL WNL WNL WNL   Vibration WNL WNL WNL WNL Proprioception   WNL WNL        Cerebellar/Coordination:  Rapid alternating movements, finger-to-nose and heel-to-shin  bilaterally demonstrates no abnormalities.    Romberg was intact with eyes closed.  No ataxia or tremors  noted    Gait: Normal stride, base and  armswing. Able  heel, and toe gait without difficulty.Unable to tandem walk.

## 2019-07-26 LAB — VITAMIN D, TOTAL (25OH): Lab: 6.1 — ABNORMAL LOW

## 2019-07-26 MED ORDER — CHOLECALCIFEROL (VITAMIN D3) 1,250 MCG (50,000 UNIT) CAPSULE
ORAL_CAPSULE | ORAL | 0 refills | 0.00000 days | Status: CP
Start: 2019-07-26 — End: ?

## 2019-07-30 ENCOUNTER — Encounter
Admit: 2019-07-30 | Discharge: 2019-07-30 | Payer: MEDICARE | Attending: Student in an Organized Health Care Education/Training Program | Primary: Student in an Organized Health Care Education/Training Program

## 2019-07-30 ENCOUNTER — Encounter: Admit: 2019-07-30 | Discharge: 2019-07-30 | Payer: MEDICARE

## 2019-07-30 DIAGNOSIS — Z131 Encounter for screening for diabetes mellitus: Principal | ICD-10-CM

## 2019-07-30 DIAGNOSIS — Z1211 Encounter for screening for malignant neoplasm of colon: Principal | ICD-10-CM

## 2019-07-30 DIAGNOSIS — Z9884 Bariatric surgery status: Principal | ICD-10-CM

## 2019-07-30 DIAGNOSIS — G4700 Insomnia, unspecified: Principal | ICD-10-CM

## 2019-07-30 DIAGNOSIS — F329 Major depressive disorder, single episode, unspecified: Principal | ICD-10-CM

## 2019-07-30 DIAGNOSIS — Z1239 Encounter for other screening for malignant neoplasm of breast: Principal | ICD-10-CM

## 2019-07-30 LAB — HEMOGLOBIN A1C
ESTIMATED AVERAGE GLUCOSE: 85 mg/dL
Hemoglobin A1c/Hemoglobin.total:MFr:Pt:Bld:Qn:: 4.6 — ABNORMAL LOW

## 2019-07-30 LAB — IRON & TIBC
IRON: 41 ug/dL (ref 35–165)
TOTAL IRON BINDING CAPACITY (CALC): 318.5 mg/dL (ref 252.0–479.0)
TRANSFERRIN: 252.8 mg/dL (ref 200.0–380.0)

## 2019-07-30 LAB — VITAMIN B-12: Cobalamins:MCnc:Pt:Ser/Plas:Qn:: 283

## 2019-07-30 LAB — TOTAL IRON BINDING CAPACITY (CALC): Lab: 318.5

## 2019-07-30 LAB — FERRITIN: Ferritin:MCnc:Pt:Ser/Plas:Qn:: 22.3

## 2019-07-30 LAB — FOLATE: Folate:MCnc:Pt:Ser/Plas:Qn:: 6.4

## 2019-07-30 MED ORDER — TRAZODONE 50 MG TABLET: 100 mg | tablet | Freq: Every evening | 0 refills | 30 days | Status: AC

## 2019-07-30 MED ORDER — PAROXETINE 30 MG TABLET: 60 mg | tablet | Freq: Every morning | 2 refills | 30 days | Status: AC

## 2019-07-30 NOTE — Unmapped (Signed)
Internal Medicine Clinic Visit    Reason for visit: Follow up     A/P:    Headache: As per HPI, has had headache over the past week. Description of headache location (center of forehead) and recent stressors (now taking care of grand kids full time at home) sounds consistent with tension headache. However, she reports that she has had headaches similar to this during previous MS flairs- though they have previously been accompanied by vision changes which she is not currently experiencing. She saw neurology last week and they ordered a repeat MRI which has not yet been done. No red flag symptoms such as nausea, vomiting, or blurry vision. No fevers or meningitic symptoms.   -- continue conservative management for now  -- Repeat MRI of the  Brain, Cervical  and Thoracic spine ordered at last neuro appointment  -- increased trazodone dose to 100mg  nightly for 30 days to help with sleep     Multiple Sclerosis: Diagnosed in 2003. Follows with [facility]. Last seen on 10/6.   -- Continue Teriflunomide daily  -- Continue Lyrica 75mg  BID   -- Continue Oxybutynin  -- Repeat MRI of the  Brain, Cervical  and Thoracic spine ordered at last neuro appointment     Vitamin D deficiency: Vit D level checked at recent Neurology appointment was 6.1. Started Vitamin D3 50,000 units weekly for 6 months and than 4,000 units daily.  -- Continue Vitamin D 50,000u weekly     S/p Gastric Bypass: Has been over a year since she had vitamin deficiency screening labs so will repeat those today with the exception of Vit D since it was checked at recent neuro appointment.   - F/u Vit A, D, E, K, B vitamin levels, folate, vitamin C, iron, ferritin, selenium, copper, and zinc levels   - Continue MVM, iron supplement, vitamin D     Depression:   -- Continue paxil 30mg  daily; refilled today     GERD:  -- Continue protonix     HCM:   Flu shot given today   Mammogram: ordered today   Colonoscopy: ordered today  Zoster Vaccines: Sent to pharmacy Lipids: last checked 2016. ASCVD was 0.8%  Hgb A1c: 4.9 in 2018, will recheck today     Return in about 6 months (around 01/28/2020).    Staffed with Dr. Delane Ginger    __________________________________________________________    HPI:    Victoria Kelly is a 48 y/o with a history of multiple sclerosis, gastric bypass, depression, GERD who presents for follow up.     Patient said she has been doing well other than a headache that started around a week ago. She says her pain is 10/10 and mostly in the center of her forehead. She said this has been going once since last week when she was playing with her grand kids and one of them accidentally bumped heads with her. No nausea, vomiting, or vision changes associated with the headache. No fevers or neck stiffness. No focal weakness but she does note that she has been 'bumping into things' more often. She has had significant difficulty sleeping due to the headache. Has taken tylenol and ibuprofen at home with little to no relief.     She also expresses some caretaker stress associated with being the primary caretaker for her two young grandchildren. She spends all day with them at home and says she has little time to do things for herself. She says that she has spoken to her daughter recently  about putting the grand kids in daycare so that some of this burden will be taken off her. She is also planning to move to [place] with her boyfriend within the next month which she is excited about.   __________________________________________________________    Problem List:  Patient Active Problem List   Diagnosis   ??? Multiple sclerosis (CMS-HCC)   ??? Dysthymic disorder   ??? Obesity   ??? Obstructive sleep apnea   ??? Weakness of wrist   ??? Ankle arthritis   ??? Allergic rhinitis   ??? Insomnia   ??? Low back pain   ??? Iron deficiency anemia   ??? Acne   ??? H/O cataract removal with insertion of prosthetic lens   ??? Hyperlipidemia   ??? History of bariatric surgery   ??? Elevated serum creatinine ??? Morbid (severe) obesity due to excess calories (CMS-HCC)   ??? S/P TAH (total abdominal hysterectomy)   ??? Rash       Medications:  Reviewed in EPIC  __________________________________________________________    Physical Exam:   Vital Signs:  Vitals:    07/30/19 1333   BP: 100/76   Pulse: 63   Weight: (!) 143.2 kg (315 lb 12.8 oz)       Gen: Well appearing, NAD, playing games on phone  CV: RRR, no murmurs   Pulm: CTA bilaterally, no crackles or wheezes  Abd: Soft, NTND, normal BS. No HSM.  Ext: No edema  Neuro: CN 2-12 grossly intact, no focal deficits, strength 5/5 in b/l upper and lower extremities, sensation to light touch intact in b/l upper and lower extremities, finger to nose test normal, gait normal     Medication adherence and barriers to the treatment plan have been addressed. Opportunities to optimize healthy behaviors have been discussed. Patient / caregiver voiced understanding.

## 2019-07-30 NOTE — Unmapped (Signed)
It was great to see you today in clinic. We will check your vitamin levels and I will call you with the results. In regards to your headache, I will send a message to try to help get your MRI scheduled. You should keep calling them as well.

## 2019-07-31 NOTE — Unmapped (Signed)
I saw and evaluated the patient, participating in the key portions of the service.  I reviewed the resident’s note.  I agree with the resident’s findings and plan. Katie Asuzena Weis, MD

## 2019-08-02 LAB — VITAMIN C: VITAMIN C LEVEL: 0.3 mg/dL — ABNORMAL LOW

## 2019-08-02 LAB — ZINC
ZINC: 0.68 ug/mL
Zinc:MCnc:Pt:Ser/Plas:Qn:: 0.68

## 2019-08-02 LAB — VITAMIN K1: Phytonadione:MCnc:Pt:Ser/Plas:Qn:: 0.49

## 2019-08-02 LAB — SELENIUM: Selenium:MCnc:Pt:Ser/Plas:Qn:: 96

## 2019-08-02 LAB — COPPER: Copper:MCnc:Pt:Ser/Plas:Qn:: 1.25

## 2019-08-02 LAB — VITAMIN C LEVEL: Ascorbate:MCnc:Pt:Ser/Plas:Qn:: 0.3 — ABNORMAL LOW

## 2019-08-02 LAB — VITAMIN B6: Pyridoxal phosphate:MCnc:Pt:Ser/Plas:Qn:: <2 — ABNORMAL LOW

## 2019-08-03 LAB — VITAMIN B1: Thiamine:SCnc:Pt:Bld:Qn:: 145

## 2019-08-03 LAB — VITAMIN B1, WHOLE BLOOD: VITAMIN B1: 145 nmol/L

## 2019-08-03 LAB — VITAMIN B2: Riboflavin:MCnc:Pt:Ser/Plas:Qn:: 3

## 2019-08-03 NOTE — Unmapped (Signed)
Called patient to inform her of her recent lab results but was unable to reach her. Sent MyChart message as well.

## 2019-08-04 LAB — VITAMIN A AND VITAMIN E: VITAMIN E LEVEL: 9.5 mg/L

## 2019-08-04 LAB — VITAMIN E LEVEL: Alpha tocopherol:MCnc:Pt:Ser/Plas:Qn:: 9.5

## 2019-08-08 LAB — VITAMIN B5: Lab: 47.1

## 2019-08-09 DIAGNOSIS — Z9884 Bariatric surgery status: Principal | ICD-10-CM

## 2019-08-09 MED ORDER — MULTIVITAMIN TABLET: 1 | tablet | Freq: Every day | 3 refills | 90 days | Status: AC

## 2019-08-09 MED ORDER — B-COMPLEX WITH VITAMIN C TABLET: 1 | tablet | Freq: Every day | 3 refills | 30 days | Status: AC

## 2019-08-09 MED ORDER — FERROUS SULFATE 325 MG (65 MG IRON) TABLET: 325 mg | tablet | Freq: Every day | 3 refills | 90 days | Status: AC

## 2019-08-10 NOTE — Unmapped (Signed)
Tried to call patient around 7:30pm to discuss increasing vitamin supplementation in light of recent lab results (low vit C, vit A, B2, B6) but wasn't able to reach her again. I have prescribed a B-complex with Vit C for her to take in addition to her multivitamin. Her vitamin A is low- but I worry about Vit A toxicity with prescribing extra Vit A in addition to her multivitamin. I suspect that she may not have been as compliant as she reported with her multivitamin at home (hadn't been filled in over a year). I will start by emphasizing the importance of taking her multivitamin daily and plan to recheck labs in a few months. We can revisit the idea of additional Vit A supplements based on those results. Also question her compliance with oral iron as this had not been filled in over a year- her ferritin is still fairly low (22) but she is not anemic. Will encourage compliance with oral iron and will discuss the possibility of IV iron based on those results.      I will attempt to call patient again soon to review these medication changes and set up next lab draw.

## 2019-08-13 NOTE — Unmapped (Signed)
Attempted to call patient to discuss lab results again. Daughter answered phone and said Victoria Kelly was not home. Will attempt to call again later.

## 2019-08-14 ENCOUNTER — Ambulatory Visit: Admit: 2019-08-14 | Discharge: 2019-08-15 | Payer: MEDICARE

## 2019-08-14 DIAGNOSIS — F329 Major depressive disorder, single episode, unspecified: Principal | ICD-10-CM

## 2019-08-14 DIAGNOSIS — G4700 Insomnia, unspecified: Principal | ICD-10-CM

## 2019-08-14 MED ORDER — TRAZODONE 50 MG TABLET
ORAL_TABLET | 1 refills | 0 days | Status: CP
Start: 2019-08-14 — End: ?

## 2019-08-14 MED ORDER — PAROXETINE 30 MG TABLET
ORAL_TABLET | Freq: Every morning | ORAL | 1 refills | 90 days | Status: CP
Start: 2019-08-14 — End: ?

## 2019-08-16 NOTE — Unmapped (Signed)
Successfully reached patient to inform her of lab results and she agreed to start taking a B-complex vitamin and Vitamin C in additional to her daily multivitamin. We also talked about importance of compliance with daily iron pills. I let her know that her mammogram results were normal. She is still waiting on scheduling for colonoscopy.

## 2019-08-21 ENCOUNTER — Encounter: Admit: 2019-08-21 | Discharge: 2019-08-21 | Payer: MEDICARE

## 2019-09-24 ENCOUNTER — Encounter
Admit: 2019-09-24 | Discharge: 2019-09-25 | Payer: MEDICARE | Attending: Critical Care Medicine | Primary: Critical Care Medicine

## 2019-09-24 NOTE — Unmapped (Signed)
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   If Yes: Student is symptomatic: N/A    Student is a [employer] employee/volunteer: N/A      Patient is experiencing symptoms severe enough that you would like to speak with a provider: No (Asymptomatic)       Patient is calling to schedule a COVID test: Yes -- Asked additional question below before attempting to schedule  If Yes: Patient has tested positive in the last 90 days for COVID-19: No -- Attempted to schedule      Advised If you think you might have COVID-19 or might have been exposed to COVID-19, you should practice physical distancing and other measures to reduce disease spread. We do recommend that you and everyone in your house stay at home as much as possible.  Keep six (6) feet of distance between you and other people. Wash your hands frequently and avoid touching your face.  The CDC now recommends wearing cloth face coverings or masks in public settings where other physical distancing measures are difficult to maintain, such as grocery stores and pharmacies. Call your PCP or the COVID Help Line [phone]) from 7a-7p with new or worsening symptoms.      The call was resolved in the following manner: COVID testing scheduled

## 2019-09-25 NOTE — Unmapped (Signed)
Assessment     Victoria Kelly is a 48 y.o. female presenting to [facility] for COVID testing.     Problem List Items Addressed This Visit     None      Visit Diagnoses     Contact with or exposure to viral disease    -  Primary    Relevant Orders    COVID-19 PCR (Completed)          Plan     If no testing performed, pt counseled on routine care for respiratory illness.  If testing performed, COVID sent.  Patient directed to Home given findings during today's visit.    Subjective     Victoria Kelly is a 48 y.o. female who [facility] with complaints of the following:    Exposure History: In the last 21 days?     Have you traveled outside of [place]? No               Have you been in close contact with someone confirmed by a test to have COVID? (Close contact is within 6 feet for at least 10 minutes) Yes, Who? NA       Have you worked in a health care facility? No     Lived or worked facility like a nursing home, group home, or assisted living?    No         Are you scheduled to have surgery or a procedure in the next 3 days? No               Are you scheduled to receive cancer chemotherapy within the next 7 days?    No     Have you ever been tested before for COVID-19 with a swab of your nose? No   Are you a healthcare worker being tested so to return to work No         Right now,  do you have any of the following that developed over the past 7 days (as stated by patient on intake form):    Subjective fever (felt feverish) No   Chills (especially repeated shaking chills) No   Severe fatigue (felt very tired) No   Muscle aches No   Runny nose No   Sore throat No   Loss of taste or smell No   Cough (new onset or worsening of chronic cough) No   Shortness of breath No   Nausea or vomiting No   Headache No   Abdominal Pain No   Diarrhea (3 or more loose stools in last 24 hours) No History/Medical Conditions (as stated by patient on intake form):    Do you have any of the following:   Asthma or emphysema or COPD No   Cystic Fibrosis No   Diabetes No   High Blood Pressure  No   Cardiovascular Disease No   Chronic Kidney Disease No   Chronic Liver Disease No   Chronic blood disorder like Sickle Cell Disease  No   Weak immune system due to disease or medication No   Neurologic condition that limits movement  No   Developmental delay - Moderate to Severe  No   Recent (within past 2 weeks) or current Pregnancy No   Morbid Obesity (>100 pounds over ideal weight) No   Current Smoker No   Former Smoker No       Objective     Given above, testing  performed: Yes    Testing Performed:  Test Specimen Type Sent to   COVID-19  [facility] [place] [facility]       Scribe's Attestation: Makinzie Considine Marrie Rollie Hynek, AGNP obtained and performed the history, physical exam and medical decision making elements that were entered into the chart.  Signed by Erma Heritage serving as Scribe, on 09/25/2019 9:06 AM  The documentation recorded by the scribe accurately reflects the service I personally performed and the decisions made by me. Elan Mcelvain M. Lonzo Saulter, AGNP  September 26, 2019 7:52 AM

## 2019-10-15 DIAGNOSIS — M792 Neuralgia and neuritis, unspecified: Principal | ICD-10-CM

## 2019-10-15 DIAGNOSIS — G35 Multiple sclerosis: Principal | ICD-10-CM

## 2019-10-15 MED ORDER — PREGABALIN 75 MG CAPSULE
ORAL_CAPSULE | 4 refills | 0 days | Status: CP
Start: 2019-10-15 — End: ?

## 2019-11-06 NOTE — Unmapped (Signed)
Received a call from [facility] [phone]) stating that the patient has refills. I called the patient since this pharmacy is not listed on her profile. Patient denied any knowledge of this pharmacy and asked that the call be ignored.

## 2019-11-06 NOTE — Unmapped (Signed)
Error

## 2019-12-24 NOTE — Unmapped (Signed)
This patient has been disenrolled from the [facility] specialty pharmacy services due to inability of the pharmacy to reach the patient.    Arnold Long  [facility] Specialty Pharmacist

## 2019-12-27 DIAGNOSIS — G35 Multiple sclerosis: Principal | ICD-10-CM

## 2019-12-27 DIAGNOSIS — N3281 Overactive bladder: Principal | ICD-10-CM

## 2019-12-27 MED ORDER — OXYBUTYNIN CHLORIDE ER 5 MG TABLET,EXTENDED RELEASE 24 HR
ORAL_TABLET | 5 refills | 0 days | Status: CP
Start: 2019-12-27 — End: ?

## 2020-01-08 ENCOUNTER — Ambulatory Visit: Admit: 2020-01-08 | Discharge: 2020-01-11 | Disposition: A | Discharge status: Home / Self Care | Payer: MEDICARE

## 2020-01-08 DIAGNOSIS — R52 Pain, unspecified: Secondary | ICD-10-CM | POA: Diagnosis not present

## 2020-01-08 DIAGNOSIS — E559 Vitamin D deficiency, unspecified: Secondary | ICD-10-CM | POA: Diagnosis not present

## 2020-01-08 DIAGNOSIS — Z9841 Cataract extraction status, right eye: Secondary | ICD-10-CM | POA: Diagnosis not present

## 2020-01-08 DIAGNOSIS — R202 Paresthesia of skin: Secondary | ICD-10-CM | POA: Diagnosis not present

## 2020-01-08 DIAGNOSIS — G4733 Obstructive sleep apnea (adult) (pediatric): Secondary | ICD-10-CM | POA: Diagnosis not present

## 2020-01-08 DIAGNOSIS — D509 Iron deficiency anemia, unspecified: Secondary | ICD-10-CM | POA: Diagnosis not present

## 2020-01-08 DIAGNOSIS — M6250 Muscle wasting and atrophy, not elsewhere classified, unspecified site: Secondary | ICD-10-CM | POA: Diagnosis not present

## 2020-01-08 DIAGNOSIS — G35 Multiple sclerosis: Secondary | ICD-10-CM | POA: Diagnosis not present

## 2020-01-08 DIAGNOSIS — E278 Other specified disorders of adrenal gland: Secondary | ICD-10-CM | POA: Diagnosis not present

## 2020-01-08 DIAGNOSIS — R531 Weakness: Secondary | ICD-10-CM | POA: Diagnosis not present

## 2020-01-08 DIAGNOSIS — N3281 Overactive bladder: Secondary | ICD-10-CM | POA: Diagnosis not present

## 2020-01-08 DIAGNOSIS — Z20822 Contact with and (suspected) exposure to covid-19: Secondary | ICD-10-CM | POA: Diagnosis not present

## 2020-01-08 DIAGNOSIS — G4700 Insomnia, unspecified: Secondary | ICD-10-CM | POA: Diagnosis not present

## 2020-01-08 DIAGNOSIS — Z9842 Cataract extraction status, left eye: Secondary | ICD-10-CM | POA: Diagnosis not present

## 2020-01-08 DIAGNOSIS — E7800 Pure hypercholesterolemia, unspecified: Secondary | ICD-10-CM | POA: Diagnosis not present

## 2020-01-08 DIAGNOSIS — R200 Anesthesia of skin: Secondary | ICD-10-CM | POA: Diagnosis not present

## 2020-01-08 DIAGNOSIS — Z9884 Bariatric surgery status: Secondary | ICD-10-CM | POA: Diagnosis not present

## 2020-01-11 MED ORDER — PREGABALIN 75 MG CAPSULE
ORAL_CAPSULE | ORAL | 0 refills | 0.00000 days | Status: CP
Start: 2020-01-11 — End: ?

## 2020-01-14 ENCOUNTER — Ambulatory Visit: Payer: Medicare Other | Attending: Internal Medicine

## 2020-01-17 DIAGNOSIS — G4733 Obstructive sleep apnea (adult) (pediatric): Principal | ICD-10-CM

## 2020-01-17 MED ORDER — ERGOCALCIFEROL (VITAMIN D2) 1,250 MCG (50,000 UNIT) CAPSULE
ORAL_CAPSULE | ORAL | 11 refills | 28.00000 days | Status: CP
Start: 2020-01-17 — End: 2021-01-16

## 2020-01-19 DIAGNOSIS — G35 Multiple sclerosis: Principal | ICD-10-CM

## 2020-01-19 DIAGNOSIS — N3281 Overactive bladder: Principal | ICD-10-CM

## 2020-01-19 DIAGNOSIS — G4700 Insomnia, unspecified: Principal | ICD-10-CM

## 2020-01-21 DIAGNOSIS — N3281 Overactive bladder: Principal | ICD-10-CM

## 2020-01-21 DIAGNOSIS — G35 Multiple sclerosis: Principal | ICD-10-CM

## 2020-01-21 MED ORDER — OXYBUTYNIN CHLORIDE ER 5 MG TABLET,EXTENDED RELEASE 24 HR
ORAL_TABLET | Freq: Every day | ORAL | 1 refills | 90.00000 days | Status: CP
Start: 2020-01-21 — End: ?

## 2020-01-23 ENCOUNTER — Encounter
Admit: 2020-01-23 | Discharge: 2020-01-24 | Payer: MEDICARE | Attending: Physician Assistant | Primary: Physician Assistant

## 2020-01-23 DIAGNOSIS — K219 Gastro-esophageal reflux disease without esophagitis: Secondary | ICD-10-CM | POA: Insufficient documentation

## 2020-01-23 DIAGNOSIS — E559 Vitamin D deficiency, unspecified: Secondary | ICD-10-CM | POA: Insufficient documentation

## 2020-01-23 DIAGNOSIS — N3281 Overactive bladder: Secondary | ICD-10-CM | POA: Insufficient documentation

## 2020-01-23 DIAGNOSIS — F32A Depression, unspecified: Secondary | ICD-10-CM | POA: Insufficient documentation

## 2020-01-23 DIAGNOSIS — G35 Multiple sclerosis: Secondary | ICD-10-CM | POA: Diagnosis not present

## 2020-01-23 MED ORDER — PREGABALIN 75 MG CAPSULE
ORAL_CAPSULE | Freq: Two times a day (BID) | ORAL | 1 refills | 30 days | Status: CP
Start: 2020-01-23 — End: ?

## 2020-02-06 DIAGNOSIS — Z79899 Other long term (current) drug therapy: Secondary | ICD-10-CM | POA: Diagnosis not present

## 2020-02-22 DIAGNOSIS — G4700 Insomnia, unspecified: Principal | ICD-10-CM

## 2020-02-22 MED ORDER — TRAZODONE 50 MG TABLET
ORAL_TABLET | 1 refills | 0 days
Start: 2020-02-22 — End: ?

## 2020-02-24 DIAGNOSIS — G4700 Insomnia, unspecified: Principal | ICD-10-CM

## 2020-02-24 MED ORDER — TRAZODONE 50 MG TABLET
ORAL_TABLET | Freq: Every evening | ORAL | 0 refills | 30.00000 days | Status: CP
Start: 2020-02-24 — End: 2020-03-25

## 2020-03-03 DIAGNOSIS — Z79899 Other long term (current) drug therapy: Secondary | ICD-10-CM | POA: Diagnosis not present

## 2020-03-05 ENCOUNTER — Encounter
Admit: 2020-03-05 | Discharge: 2020-03-06 | Payer: MEDICARE | Attending: Student in an Organized Health Care Education/Training Program | Primary: Student in an Organized Health Care Education/Training Program

## 2020-03-05 DIAGNOSIS — G4733 Obstructive sleep apnea (adult) (pediatric): Secondary | ICD-10-CM | POA: Diagnosis not present

## 2020-03-05 DIAGNOSIS — M7989 Other specified soft tissue disorders: Secondary | ICD-10-CM | POA: Diagnosis not present

## 2020-03-05 DIAGNOSIS — G35 Multiple sclerosis: Secondary | ICD-10-CM | POA: Diagnosis not present

## 2020-03-05 DIAGNOSIS — Z9884 Bariatric surgery status: Secondary | ICD-10-CM | POA: Diagnosis not present

## 2020-03-05 DIAGNOSIS — G4700 Insomnia, unspecified: Principal | ICD-10-CM

## 2020-03-05 MED ORDER — TRAZODONE 50 MG TABLET
ORAL_TABLET | Freq: Every evening | ORAL | 0 refills | 30.00000 days
Start: 2020-03-05 — End: 2020-04-04

## 2020-03-18 ENCOUNTER — Encounter
Admit: 2020-03-18 | Discharge: 2020-03-19 | Payer: MEDICARE | Attending: Physician Assistant | Primary: Physician Assistant

## 2020-03-18 DIAGNOSIS — G35 Multiple sclerosis: Secondary | ICD-10-CM | POA: Diagnosis not present

## 2020-03-20 DIAGNOSIS — F329 Major depressive disorder, single episode, unspecified: Principal | ICD-10-CM

## 2020-03-20 MED ORDER — PAROXETINE 30 MG TABLET
ORAL_TABLET | Freq: Every morning | ORAL | 3 refills | 90.00000 days | Status: CP
Start: 2020-03-20 — End: ?

## 2020-03-23 DIAGNOSIS — G4700 Insomnia, unspecified: Principal | ICD-10-CM

## 2020-03-23 MED ORDER — TRAZODONE 50 MG TABLET
ORAL_TABLET | 1 refills | 0.00000 days
Start: 2020-03-23 — End: ?

## 2020-03-25 MED ORDER — TRAZODONE 50 MG TABLET
ORAL_TABLET | 3 refills | 0 days | Status: CP
Start: 2020-03-25 — End: ?

## 2020-03-25 MED ORDER — PREGABALIN 75 MG CAPSULE
ORAL_CAPSULE | Freq: Two times a day (BID) | ORAL | 4 refills | 30 days | Status: CP
Start: 2020-03-25 — End: ?

## 2020-03-26 ENCOUNTER — Ambulatory Visit
Admit: 2020-03-26 | Discharge: 2020-03-27 | Payer: MEDICARE | Attending: Student in an Organized Health Care Education/Training Program | Primary: Student in an Organized Health Care Education/Training Program

## 2020-03-26 DIAGNOSIS — G4733 Obstructive sleep apnea (adult) (pediatric): Secondary | ICD-10-CM | POA: Diagnosis not present

## 2020-03-26 DIAGNOSIS — R600 Localized edema: Secondary | ICD-10-CM | POA: Diagnosis not present

## 2020-03-26 MED ORDER — FUROSEMIDE 20 MG TABLET
ORAL_TABLET | Freq: Every day | ORAL | 3 refills | 30 days | Status: CP
Start: 2020-03-26 — End: 2021-03-26

## 2020-07-01 DIAGNOSIS — G4700 Insomnia, unspecified: Principal | ICD-10-CM

## 2020-07-01 MED ORDER — TRAZODONE 50 MG TABLET
ORAL_TABLET | 1 refills | 0 days
Start: 2020-07-01 — End: ?

## 2020-07-02 MED ORDER — TRAZODONE 50 MG TABLET
ORAL_TABLET | 1 refills | 0 days
Start: 2020-07-02 — End: ?

## 2020-07-14 ENCOUNTER — Ambulatory Visit
Admit: 2020-07-14 | Discharge: 2020-07-15 | Payer: MEDICARE | Attending: Student in an Organized Health Care Education/Training Program | Primary: Student in an Organized Health Care Education/Training Program

## 2020-07-14 DIAGNOSIS — Z1239 Encounter for other screening for malignant neoplasm of breast: Secondary | ICD-10-CM | POA: Diagnosis not present

## 2020-07-14 DIAGNOSIS — K219 Gastro-esophageal reflux disease without esophagitis: Secondary | ICD-10-CM | POA: Diagnosis not present

## 2020-07-14 DIAGNOSIS — D508 Other iron deficiency anemias: Secondary | ICD-10-CM | POA: Diagnosis not present

## 2020-07-14 DIAGNOSIS — Z23 Encounter for immunization: Secondary | ICD-10-CM | POA: Diagnosis not present

## 2020-07-14 DIAGNOSIS — Z9884 Bariatric surgery status: Secondary | ICD-10-CM | POA: Diagnosis not present

## 2020-07-14 DIAGNOSIS — F329 Major depressive disorder, single episode, unspecified: Principal | ICD-10-CM

## 2020-07-14 MED ORDER — PANTOPRAZOLE 40 MG TABLET,DELAYED RELEASE
ORAL_TABLET | Freq: Every day | ORAL | 3 refills | 90.00000 days | Status: CP
Start: 2020-07-14 — End: ?

## 2020-07-14 MED ORDER — PAROXETINE 30 MG TABLET
ORAL_TABLET | Freq: Every morning | ORAL | 3 refills | 90 days | Status: CP
Start: 2020-07-14 — End: ?

## 2020-08-25 DIAGNOSIS — G35 Multiple sclerosis: Principal | ICD-10-CM

## 2020-08-25 DIAGNOSIS — N3281 Overactive bladder: Principal | ICD-10-CM

## 2020-08-25 MED ORDER — OXYBUTYNIN CHLORIDE ER 5 MG TABLET,EXTENDED RELEASE 24 HR
ORAL_TABLET | 5 refills | 0 days
Start: 2020-08-25 — End: ?

## 2020-08-26 MED ORDER — PREGABALIN 75 MG CAPSULE
ORAL_CAPSULE | 4 refills | 0 days | Status: CP
Start: 2020-08-26 — End: ?

## 2020-09-04 ENCOUNTER — Encounter: Admit: 2020-09-04 | Discharge: 2020-09-06 | Payer: MEDICARE

## 2020-09-04 DIAGNOSIS — G4733 Obstructive sleep apnea (adult) (pediatric): Secondary | ICD-10-CM | POA: Diagnosis not present

## 2020-09-09 DIAGNOSIS — G4733 Obstructive sleep apnea (adult) (pediatric): Principal | ICD-10-CM

## 2020-09-16 DIAGNOSIS — N3281 Overactive bladder: Principal | ICD-10-CM

## 2020-09-16 DIAGNOSIS — G35 Multiple sclerosis: Principal | ICD-10-CM

## 2020-09-16 MED ORDER — OXYBUTYNIN CHLORIDE ER 5 MG TABLET,EXTENDED RELEASE 24 HR
ORAL_TABLET | 5 refills | 0 days | Status: CP
Start: 2020-09-16 — End: ?

## 2020-09-25 ENCOUNTER — Encounter: Admit: 2020-09-25 | Discharge: 2020-09-25 | Payer: MEDICARE | Attending: Obesity Medicine | Primary: Obesity Medicine

## 2020-09-25 ENCOUNTER — Ambulatory Visit: Admit: 2020-09-25 | Discharge: 2020-09-25 | Payer: MEDICARE

## 2020-09-25 DIAGNOSIS — G35 Multiple sclerosis: Secondary | ICD-10-CM | POA: Diagnosis not present

## 2020-09-25 DIAGNOSIS — Z9884 Bariatric surgery status: Secondary | ICD-10-CM | POA: Diagnosis not present

## 2020-10-06 ENCOUNTER — Encounter: Admit: 2020-10-06 | Discharge: 2020-10-07 | Payer: MEDICARE

## 2020-10-06 DIAGNOSIS — Z1231 Encounter for screening mammogram for malignant neoplasm of breast: Secondary | ICD-10-CM | POA: Diagnosis not present

## 2020-10-06 DIAGNOSIS — Z1239 Encounter for other screening for malignant neoplasm of breast: Secondary | ICD-10-CM | POA: Diagnosis not present

## 2020-10-07 DIAGNOSIS — H26493 Other secondary cataract, bilateral: Secondary | ICD-10-CM | POA: Diagnosis not present

## 2020-10-07 DIAGNOSIS — Z961 Presence of intraocular lens: Secondary | ICD-10-CM | POA: Diagnosis not present

## 2020-10-07 DIAGNOSIS — H04123 Dry eye syndrome of bilateral lacrimal glands: Secondary | ICD-10-CM | POA: Diagnosis not present

## 2020-10-23 ENCOUNTER — Ambulatory Visit: Admit: 2020-10-23 | Discharge: 2020-10-24 | Payer: MEDICARE

## 2020-10-23 DIAGNOSIS — Z01818 Encounter for other preprocedural examination: Principal | ICD-10-CM

## 2020-10-23 DIAGNOSIS — Z9884 Bariatric surgery status: Principal | ICD-10-CM

## 2020-10-24 DIAGNOSIS — Z01818 Encounter for other preprocedural examination: Principal | ICD-10-CM

## 2020-10-30 ENCOUNTER — Ambulatory Visit: Admit: 2020-10-30 | Discharge: 2020-11-01 | Payer: MEDICARE

## 2020-10-30 DIAGNOSIS — D509 Iron deficiency anemia, unspecified: Principal | ICD-10-CM

## 2020-10-30 DIAGNOSIS — G4733 Obstructive sleep apnea (adult) (pediatric): Principal | ICD-10-CM

## 2020-10-30 DIAGNOSIS — M549 Dorsalgia, unspecified: Principal | ICD-10-CM

## 2020-10-30 DIAGNOSIS — E785 Hyperlipidemia, unspecified: Principal | ICD-10-CM

## 2020-10-30 DIAGNOSIS — J309 Allergic rhinitis, unspecified: Principal | ICD-10-CM

## 2020-10-30 DIAGNOSIS — L709 Acne, unspecified: Principal | ICD-10-CM

## 2020-11-05 ENCOUNTER — Encounter: Admit: 2020-11-05 | Discharge: 2020-11-06 | Payer: MEDICARE

## 2020-11-05 DIAGNOSIS — Z01818 Encounter for other preprocedural examination: Principal | ICD-10-CM

## 2020-11-06 DIAGNOSIS — F411 Generalized anxiety disorder: Principal | ICD-10-CM

## 2020-11-06 DIAGNOSIS — E7800 Pure hypercholesterolemia, unspecified: Principal | ICD-10-CM

## 2020-11-06 DIAGNOSIS — F341 Dysthymic disorder: Principal | ICD-10-CM

## 2020-11-06 DIAGNOSIS — G4700 Insomnia, unspecified: Principal | ICD-10-CM

## 2020-11-06 DIAGNOSIS — Z5941 Food insecurity: Principal | ICD-10-CM

## 2020-11-06 DIAGNOSIS — R635 Abnormal weight gain: Principal | ICD-10-CM

## 2020-11-06 DIAGNOSIS — Z79899 Other long term (current) drug therapy: Principal | ICD-10-CM

## 2020-11-06 DIAGNOSIS — Z903 Acquired absence of stomach [part of]: Principal | ICD-10-CM

## 2020-11-06 DIAGNOSIS — D509 Iron deficiency anemia, unspecified: Principal | ICD-10-CM

## 2020-11-06 DIAGNOSIS — K219 Gastro-esophageal reflux disease without esophagitis: Principal | ICD-10-CM

## 2020-11-07 DIAGNOSIS — Z01818 Encounter for other preprocedural examination: Principal | ICD-10-CM

## 2020-11-10 ENCOUNTER — Ambulatory Visit: Admit: 2020-11-10 | Discharge: 2020-11-11 | Payer: MEDICARE

## 2020-11-10 DIAGNOSIS — Z01818 Encounter for other preprocedural examination: Principal | ICD-10-CM

## 2020-11-10 DIAGNOSIS — G4733 Obstructive sleep apnea (adult) (pediatric): Principal | ICD-10-CM

## 2020-11-12 ENCOUNTER — Encounter: Admit: 2020-11-12 | Discharge: 2020-11-12 | Payer: MEDICARE

## 2020-11-12 DIAGNOSIS — Z5941 Food insecurity: Principal | ICD-10-CM

## 2020-11-12 DIAGNOSIS — F411 Generalized anxiety disorder: Principal | ICD-10-CM

## 2020-11-12 DIAGNOSIS — R635 Abnormal weight gain: Principal | ICD-10-CM

## 2020-11-12 DIAGNOSIS — D509 Iron deficiency anemia, unspecified: Principal | ICD-10-CM

## 2020-11-12 DIAGNOSIS — G4700 Insomnia, unspecified: Principal | ICD-10-CM

## 2020-11-12 DIAGNOSIS — E7800 Pure hypercholesterolemia, unspecified: Principal | ICD-10-CM

## 2020-11-12 DIAGNOSIS — K219 Gastro-esophageal reflux disease without esophagitis: Principal | ICD-10-CM

## 2020-11-12 DIAGNOSIS — Z903 Acquired absence of stomach [part of]: Principal | ICD-10-CM

## 2020-11-12 DIAGNOSIS — Z79899 Other long term (current) drug therapy: Principal | ICD-10-CM

## 2020-11-12 DIAGNOSIS — F341 Dysthymic disorder: Principal | ICD-10-CM

## 2020-11-13 MED ORDER — ERGOCALCIFEROL (VITAMIN D2) 1,250 MCG (50,000 UNIT) CAPSULE
ORAL_CAPSULE | ORAL | 11 refills | 28.00000 days | Status: CP
Start: 2020-11-13 — End: 2021-11-13

## 2021-01-05 DIAGNOSIS — Z1211 Encounter for screening for malignant neoplasm of colon: Principal | ICD-10-CM

## 2021-01-06 ENCOUNTER — Encounter
Admit: 2021-01-06 | Discharge: 2021-01-07 | Payer: MEDICARE | Attending: Physician Assistant | Primary: Physician Assistant

## 2021-01-06 MED ORDER — AUBAGIO 14 MG TABLET
ORAL_TABLET | Freq: Every evening | ORAL | 5 refills | 30 days | Status: CP
Start: 2021-01-06 — End: ?
  Filled 2021-01-08: qty 30, 30d supply, fill #0

## 2021-01-06 NOTE — Unmapped (Signed)
The patient declined counseling on medication administration, missed dose instructions, goals of therapy, side effects and monitoring parameters, warnings and precautions, drug/food interactions and storage, handling precautions, and disposal because they have taken the medication previously. The information in the declined sections below are for informational purposes only and was not discussed with patient.       [facility]   Patient Onboarding/Medication Counseling    Victoria Kelly is a 50 y.o. female with multiple sclerosis who I am counseling today on continuation of therapy.  I am speaking to the patient.    Was a Nurse, learning disability used for this call? No    Verified patient's date of birth / HIPAA.    Specialty medication(s) to be sent: Neurology: Aubagio      Non-specialty medications/supplies to be sent: none      Medications not needed at this time: n/a         Aubagio (teriflunomide)    Medication & Administration     Dosage: Take 1 tablet (14mg ) by mouth once daily.    Administration: Take Aubagio by mouth once daily without regard to meals    Adherence/Missed dose instructions: Take a missed dose as soon as you remember. If it is close to the time of your next dose, skip the missed dose and resume your normal schedule. Do not take 2 doses at once.    Pre-initiation Monitoring     ??? TB test is required prior to initiation. Is there a documented TB test in Epic? Yes, negative.  ??? For female patients ??? is there a documented pregnancy test? n/a.    Goals of Therapy     Reduce the frequency of flare-ups and slow disease progression    Side Effects & Monitoring Parameters     ??? Upset stomach/nausea/diarrhea  ??? Headache  ??? Hair thinning/loss  ??? Joint pain    The following side effects should be reported to the provider:  ??? Signs of an allergic reaction  ??? Signs of liver problems (yellowing of the skin/eyes, light colored stool, dark urine, severe stomach pain/vomiting)  ??? Signs of high blood pressure (severe headache/dizziness, passing out, changes in vision)  ??? Swollen glands  ??? Burning, numbness or tingling that is not normal  ??? Signs of infection  ??? Unexplained bruising or bleeding  ??? Severe fatigue  ??? Signs of Stevens-Johnson syndrome (redness, swelling, blistering or feeling of the skin; red or irritated eyes; sores in the mouth, throat, nose or eyes)  ??? Shortness of breath, difficulty breathing or new or worsening cough      Warnings & Precautions     ??? Contraindications:  o Pregnancy  o Coadministrating with leflunomide  ??? Warnings:  o Hepatotoxicity (LFTs within 6 months prior to initiation, monthly for the first 6 months of therapy then every 6 months or as clinically indicated)  o Reproductive concerns (both women AND men must use contraception while taking Aubagio)  o Hypertension (blood pressure should be monitored prior to initiation and periodically thereafter)    Drug/Food Interactions     ??? Medication list reviewed in Epic. The patient was instructed to inform the care team before taking any new medications or supplements. No drug interactions identified.   ??? Immunization catch-up should be completed prior to initiating Aubagio. Live vaccines should be avoided during therapy.     Storage, Handling Precautions, & Disposal     ??? Store at room temperature in the original labelled package.  ??? Aubagio  is considered a hazardous agent, it is recommended that anyone other than the patient should wear gloves if handling.      Current Medications (including OTC/herbals), Comorbidities and Allergies     Current Outpatient Medications   Medication Sig Dispense Refill   ??? acetaminophen (TYLENOL) 500 MG tablet Take 2 tablets (1,000 mg total) by mouth every eight (8) hours as needed for pain. 30 tablet 0   ??? ergocalciferol-1,250 mcg, 50,000 unit, (DRISDOL) 1,250 mcg (50,000 unit) capsule Take 1 capsule (1,250 mcg total) by mouth once a week. On Thursdays 4 capsule 11   ??? ferrous sulfate 325 (65 FE) MG tablet Take 1 tablet (325 mg total) by mouth daily. 90 tablet 3   ??? fluticasone (FLONASE) 50 mcg/actuation nasal spray 2 sprays by Each Nare route daily. 16 g 6   ??? melatonin 5 mg tablet Take 5 mg by mouth nightly as needed.     ??? multivitamin (TAB-A-VITE/THERAGRAN) per tablet Take 1 tablet by mouth daily. 90 tablet 3   ??? oxybutynin (DITROPAN-XL) 5 MG 24 hr tablet TAKE 1 TABLET BY MOUTH EVERY MORNING 30 tablet 5   ??? pantoprazole (PROTONIX) 40 MG tablet Take 1 tablet (40 mg total) by mouth daily. 90 tablet 3   ??? PARoxetine (PAXIL) 30 MG tablet Take 2 tablets (60 mg total) by mouth every morning. (Patient taking differently: Take 60 mg by mouth every evening. ) 180 tablet 3   ??? pregabalin (LYRICA) 75 MG capsule TAKE TWO (2) CAPSULES BY MOUTH TWICE DAILY 120 capsule 4   ??? teriflunomide (AUBAGIO) 14 mg Tab Take 1 tablet (14 mg total) by mouth every evening. 30 tablet 5   ??? traZODone (DESYREL) 50 MG tablet TAKE 1 TABLET BY MOUTH EVERY DAY AT NIGHT 90 tablet 3     No current facility-administered medications for this visit.       Allergies   Allergen Reactions   ??? Cymbalta [Duloxetine] Other (See Comments)     Metal taste in mouth     ??? Keflex [Cephalexin] Other (See Comments)     rash       Patient Active Problem List   Diagnosis   ??? Multiple sclerosis (CMS-HCC)   ??? Dysthymic disorder   ??? Obesity   ??? Obstructive sleep apnea   ??? Weakness of wrist   ??? Ankle arthritis   ??? Allergic rhinitis   ??? Insomnia   ??? Low back pain   ??? Iron deficiency anemia   ??? Acne   ??? H/O cataract removal with insertion of prosthetic lens   ??? Hyperlipidemia   ??? History of bariatric surgery   ??? Elevated serum creatinine   ??? Morbid (severe) obesity due to excess calories (CMS-HCC)   ??? S/P TAH (total abdominal hysterectomy)   ??? Rash       Reviewed and up to date in Epic.    Appropriateness of Therapy     Acute infection status:  No active infections  If patient is seen at an outside health-system, please confirm with patient if they have an acute infection.    Is medication and dose appropriate based on diagnosis and infection status? Yes    Prescription has been clinically reviewed: Yes      Baseline Quality of Life Assessment      How many days over the past month did your MS  keep you from your normal activities? For example, brushing your teeth or getting up in the morning. 0  Financial Information     Medication Assistance provided: Prior Authorization    Anticipated copay of $0 reviewed with patient. Verified delivery address.    Delivery Information     Scheduled delivery date: 01/08/21    Expected start date: 01/08/21    Medication will be delivered via Same Day Courier to the prescription address in [facility].  This shipment will not require a signature.      Explained the services we provide at [facility] and that each month we would call to set up refills.  Stressed importance of returning phone calls so that we could ensure they receive their medications in time each month.  Informed patient that we should be setting up refills 7-10 days prior to when they will run out of medication.  A pharmacist will reach out to perform a clinical assessment periodically.  Informed patient that a welcome packet, containing information about our pharmacy and other support services, a Notice of Privacy Practices, and a drug information handout will be sent.      Patient verbalized understanding of the above information as well as how to contact the pharmacy at [phone] option 4 with any questions/concerns.  The pharmacy is open Monday through Friday 8:30am-4:30pm.  A pharmacist is available 24/7 via pager to answer any clinical questions they may have.    Patient Specific Needs     - Does the patient have any physical, cognitive, or cultural barriers? No    - Patient prefers to have medications discussed with  Patient     - Is the patient or caregiver able to read and understand education materials at a high school level or above? Yes    - Patient's primary language is  English     - Is the patient high risk? No    - Does the patient require a Care Management Plan? No     - Does the patient require physician intervention or other additional services (i.e. nutrition, smoking cessation, social work)? No      Arnold Long  [facility] Specialty Pharmacist

## 2021-01-06 NOTE — Unmapped (Signed)
[facility] Specialty Medication Onboarding    Specialty Medication: Aubagio  Prior Authorization: Not Required   Financial Assistance: No - copay  <$25  Final Copay/Day Supply: $0 / 30    Insurance Restrictions: None     Notes to Pharmacist:     The triage team has completed the benefits investigation and has determined that the patient is able to fill this medication at [facility]. Please contact the patient to complete the onboarding or follow up with the prescribing physician as needed.

## 2021-01-06 NOTE — Unmapped (Signed)
[facility] at [facility]  Multiple Sclerosis / Neuroimmunology Division  Victoria Kelly [facility]  Physician Assistant, Victoria Kelly, [place]  Phone: [phone]  Fax: [phone]      Patient Name: Victoria Kelly   Date of Birth: Apr 21, 1971  Medical Record Number: [id number]  [address]  [place] [address]     Direct entry by:  Victoria Blamer, PA-C, MPAS.  Supervising Physician: Dr. Desma Kelly, Dr. Quillian Kelly.    DATE OF VISIT: January 06, 2021    REASON FOR PATIENT OUTREACH /  video CALL ENCOUNTER: Follow up for evaluation of  Multiple Sclerosis / Demyelinating Disease / or other Neurological problem.          The patient reports they are currently: at home. I spent 31 minutes on the real-time audio and video visit with the patient on the date of service. I spent an additional 10 minutes on pre- and post-visit activities on the date of service.     The patient was not located and I was not located within 250 yards of a hospital based location during the real-time audio and video visit. The patient was physically located in [place] or a state in which I am permitted to provide care. The patient and/or parent/guardian understood that s/he may incur co-pays and cost sharing, and agreed to the telemedicine visit. The visit was reasonable and appropriate under the circumstances given the patient's presentation at the time.    The patient and/or parent/guardian has been advised of the potential risks and limitations of this mode of treatment (including, but not limited to, the absence of in-person examination) and has agreed to be treated using telemedicine. The patient's/patient's family's questions regarding telemedicine have been answered.    If the visit was completed in an ambulatory setting, the patient and/or parent/guardian has also been advised to contact their provider???s office for worsening conditions, and seek emergency medical treatment and/or call 911 if the patient deems either necessary.    ASSESSMENT AND PLAN:  ** Relapsing Remitting Multiple Sclerosis. History of overactive bladder, Vitamin D deficiency, OSA, anxiety, and IDA??  -Started Aubagio 03/2017. Refill.  -Reviewed CMP and Vit D from 09/2020. Last CBC/diff was 12/2019.  -Check CBC/diff and LFT at PCP.  -Schedule yearly MRI of the  Brain, Cervical  and Thoracic spine with / without contrast. and adult sedation due to claustrophobia.  Added contrast because she [place] having new symptoms and last MRI was 08/21/2019.    **Balance difficulties per patient report when standing up: Recommend to have PCP check orthostatics. Schedule MRI's, monitor. Exam today showed no balance problems or weakness on video.  **Continue Lyrica to 150mg  BID, Oxybutynin XR 5mg .  Follow up 6 months.    INTERVAL HISTORY / CHIEF COMPLAINT :  Last visit 03/18/2020.  Reports that her balance is off for the past week only when she stands up, she feels like she is about to fall down. Not dizziness  just balance is off , bumps into things. Had cataract laser surgery again bilateral 10/2020 and 11/2020 but states that her vision is fine.  Dropped the pepper shaker one time, right hand. No further dropping episodes.  Reports that she took her last Aubagio today.  Received COVID-19 vaccine, Pfizer on 01/16/2020 and 02/06/2020 and booster 08/26/2020.    VIDEO EXAM:  General :   In no acute distress. Normal skin color.     Alert and oriented to  person, place, time and situation.   Recent and remote memory intact.    Attention span and concentration normal.    Language and spontaneous speech normal, no dysarthria or aphasia.  Fund of knowledge normal.     Neurological Examination:     Cranial Nerves:   Il, lll- Reports no changes in vision.  II, IV, VI- extra ocular movements are intact, No ptosis, no nystagmus.  V- sensation of the face decreased on the left side, V1-3. Patient performed on self.  VII- face symmetrical, no facial droop, normal facial movements with smile/grimace  VIII- Hearing grossly intact.  XI- Full shoulder shrug bilaterally  XII- Tongue protrudes midline, full range of movements of the tongue    Neck flexion normal.    Sensory UEs LEs    R L R L   Light touch performed by patient WNL Decreased  WNL Decreased from knee to foot.     Cerebellar/Coordination:  Rapid alternating movements, finger-to-nose and heel-to-shin  bilaterally demonstrates no abnormalities.  No limb, trunk or gait ataxia seen.. Does not appear on video to be off balance.  No tremors noted.    Motor Exam  Able to stand up from the chair.   Able to march in place.    Gait: normal stride, normal base, normal armswing. Able to tandem, heel, and toe gait without difficulty. Negative Romberg.      PRIOR HISTORY: A 50 y.o. African American female. She is here with her daughter and grandson 69 months and 96 month odf nephew.  [name] has been diagnosed with MS in 2003.  Seen by Dr. Johnnye Lana on 01/16/2018. This is from her notes.  In 2003 she had left sided ON. Right sided ON occurred after a month. She now has color vision decrease. She also has a cataract (2006/2007), and did the surgery. In the following years she had an episode when she could not feel cold/warm on one side of her body. She got better but not completely. She further had an episode of right sided weakness and received steroids and recovered. 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   She was initially treated with Copaxone; however, discontinued in 2011 (per notes by Dr. Hunt Oris).   Started with Aubagio at the end of June 2018.     LUMBAR PUNCTURE:  Per notes by Dr. Alessandra Bevels on 04/18/2007 'she had a negative lumbar puncture' but I have no results available for my review.     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  Was evaluated in ER, treated with course of IV steroids at home.  Does not remember details.  Hosptilaized 03/23-03/26/2021 for sensory symptoms. Balance was off and she was having pain in her feet. Received three days of IV methylprednisolone     MS MEDICATION:  Copaxone 2011.  Aubagio 03/2017.    GYN HISTORY HISTORY:  No LMP recorded. Patient has had a hysterectomy.    REVIEW OF SYSTEMS:  A 10-systems review was performed and, unless otherwise noted, declared negative by patient.    No visits with results within 1 Month(s) from this visit.   Latest known visit with results is:   Admission on 11/12/2020, Discharged on 11/12/2020   Component Date Value Ref Range Status   ??? WBC 11/12/2020 5.4  3.5 - 10.5 10*9/L Final   ??? RBC 11/12/2020 4.84  3.90 - 5.03 10*12/L Final   ???  HGB 11/12/2020 13.8  12.0 - 15.5 g/dL Final   ??? HCT 81/19/1478 41.1  35.0 - 44.0 % Final   ??? MCV 11/12/2020 84.8  82.0 - 98.0 fL Final   ??? MCH 11/12/2020 28.4  26.0 - 34.0 pg Final   ??? MCHC 11/12/2020 33.5  30.0 - 36.0 g/dL Final   ??? RDW 29/56/2130 14.4  12.0 - 15.0 % Final   ??? MPV 11/12/2020 7.8  7.0 - 10.0 fL Final   ??? Platelet 11/12/2020 221  150 - 450 10*9/L Final   ??? EKG Ventricular Rate 11/12/2020 60  BPM Incomplete   ??? EKG Atrial Rate 11/12/2020 60  BPM Incomplete   ??? EKG P-R Interval 11/12/2020 128  ms Incomplete   ??? EKG QRS Duration 11/12/2020 88  ms Incomplete   ??? EKG Q-T Interval 11/12/2020 410  ms Incomplete   ??? EKG QTC Calculation 11/12/2020 410  ms Incomplete   ??? EKG Calculated P Axis 11/12/2020 57  degrees Incomplete   ??? EKG Calculated R Axis 11/12/2020 -19  degrees Incomplete   ??? EKG Calculated T Axis 11/12/2020 34  degrees Incomplete   ??? QTC Fredericia 11/12/2020 410  ms Incomplete       No visits with results within 1 Month(s) from this visit.   Latest known visit with results is:   Admission on 11/12/2020, Discharged on 11/12/2020   Component Date Value Ref Range Status   ??? WBC 11/12/2020 5.4  3.5 - 10.5 10*9/L Final   ??? RBC 11/12/2020 4.84  3.90 - 5.03 10*12/L Final   ??? HGB 11/12/2020 13.8  12.0 - 15.5 g/dL Final   ??? HCT 86/57/8469 41.1  35.0 - 44.0 % Final   ??? MCV 11/12/2020 84.8  82.0 - 98.0 fL Final   ??? MCH 11/12/2020 28.4  26.0 - 34.0 pg Final   ??? MCHC 11/12/2020 33.5  30.0 - 36.0 g/dL Final   ??? RDW 62/95/2841 14.4  12.0 - 15.0 % Final   ??? MPV 11/12/2020 7.8  7.0 - 10.0 fL Final   ??? Platelet 11/12/2020 221  150 - 450 10*9/L Final   ??? EKG Ventricular Rate 11/12/2020 60  BPM Incomplete   ??? EKG Atrial Rate 11/12/2020 60  BPM Incomplete   ??? EKG P-R Interval 11/12/2020 128  ms Incomplete   ??? EKG QRS Duration 11/12/2020 88  ms Incomplete   ??? EKG Q-T Interval 11/12/2020 410  ms Incomplete   ??? EKG QTC Calculation 11/12/2020 410  ms Incomplete   ??? EKG Calculated P Axis 11/12/2020 57  degrees Incomplete   ??? EKG Calculated R Axis 11/12/2020 -19  degrees Incomplete   ??? EKG Calculated T Axis 11/12/2020 34  degrees Incomplete   ??? QTC Fredericia 11/12/2020 410  ms Incomplete       PROBLEM LIST:    Patient Active Problem List   Diagnosis   ??? Multiple sclerosis (CMS-HCC)   ??? Dysthymic disorder   ??? Obesity   ??? Obstructive sleep apnea   ??? Weakness of wrist   ??? Ankle arthritis   ??? Allergic rhinitis   ??? Insomnia   ??? Low back pain   ??? Iron deficiency anemia   ??? Acne   ??? H/O cataract removal with insertion of prosthetic lens   ??? Hyperlipidemia   ??? History of bariatric surgery   ??? Elevated serum creatinine   ??? Morbid (severe) obesity due to excess calories (CMS-HCC)   ??? S/P TAH (total abdominal hysterectomy)   ???  Rash     Current Outpatient Medications   Medication Sig Dispense Refill   ??? acetaminophen (TYLENOL) 500 MG tablet Take 2 tablets (1,000 mg total) by mouth every eight (8) hours as needed for pain. 30 tablet 0   ??? ergocalciferol-1,250 mcg, 50,000 unit, (DRISDOL) 1,250 mcg (50,000 unit) capsule Take 1 capsule (1,250 mcg total) by mouth once a week. On Thursdays 4 capsule 11   ??? ferrous sulfate 325 (65 FE) MG tablet Take 1 tablet (325 mg total) by mouth daily. 90 tablet 3   ??? fluticasone (FLONASE) 50 mcg/actuation nasal spray 2 sprays by Each Nare route daily. 16 g 6   ??? melatonin 5 mg tablet Take 5 mg by mouth nightly as needed.     ??? multivitamin (TAB-A-VITE/THERAGRAN) per tablet Take 1 tablet by mouth daily. 90 tablet 3   ??? oxybutynin (DITROPAN-XL) 5 MG 24 hr tablet TAKE 1 TABLET BY MOUTH EVERY MORNING 30 tablet 5   ??? pantoprazole (PROTONIX) 40 MG tablet Take 1 tablet (40 mg total) by mouth daily. 90 tablet 3   ??? PARoxetine (PAXIL) 30 MG tablet Take 2 tablets (60 mg total) by mouth every morning. (Patient taking differently: Take 60 mg by mouth every evening. ) 180 tablet 3   ??? pregabalin (LYRICA) 75 MG capsule TAKE TWO (2) CAPSULES BY MOUTH TWICE DAILY 120 capsule 4   ??? teriflunomide (AUBAGIO) 14 mg Tab Take 14 mg by mouth every evening.     ??? traZODone (DESYREL) 50 MG tablet TAKE 1 TABLET BY MOUTH EVERY DAY AT NIGHT 90 tablet 3     No current facility-administered medications for this visit.         Past Surgical Hx:    Past Surgical History:   Procedure Laterality Date   ??? CATARACT EXTRACTION Bilateral 08/14/2003, 09/09/2003   ??? CESAREAN SECTION  01/12/2000   ??? COSMETIC SURGERY     ??? DEBRIDEMENT LEG Left 09/30/1999    Spider bite   ??? ENDOMETRIAL ABLATION  02/11/2005   ??? PR LAP, GAST RESTRICT PROC, LONGITUDINAL GASTRECTOMY Midline 02/15/2014    Procedure: ROBOTIC PR LAP, GAST RESTRICT PROC, LONGITUDINAL GASTRECTOMY;  Surgeon: Aida Raider, MD;  Location: MAIN OR [place];  Service: Gastrointestinal   ??? PR UPPER GI ENDOSCOPY,DIAGNOSIS N/A 11/12/2020    Procedure: ESOPHAGOGASTRODUODENOSCOPY;  Surgeon: San Morelle, MD;  Location: ENDO PROCEDURES [place];  Service: Gastroenterology   ??? TONSILLECTOMY  1982   ??? TOTAL ABDOMINAL HYSTERECTOMY     ??? TUBAL LIGATION  01/12/2000       Social Hx:    Social History     Socioeconomic History   ??? Marital status: Divorced     Spouse name: Not on file   ??? Number of children: 3   ??? Years of education: Not on file   ??? Highest education level: Not on file   Occupational History   ??? Occupation: Personal assistant: unknown   Tobacco Use   ??? Smoking status: Never Smoker   ??? Smokeless tobacco: Never Used   Vaping Use   ??? Vaping Use: Never used   Substance and Sexual Activity   ??? Alcohol use: No   ??? Drug use: No   ??? Sexual activity: Not Currently   Other Topics Concern   ??? Do you use sunscreen? No   ??? Tanning bed use? No   ??? Are you easily burned? No   ??? Excessive sun exposure? No   ??? Blistering  sunburns? Not Asked   Social History Narrative    Single, has 3 children, aged 65-25. Married in 1997, divorced in 2001. Lives with her boyfriend. On disability (r/t Multiple Sclerosis), previously employed as a Interior and spatial designer, bus Hospital doctor. Born and raised in [place]. Attended [employer] in 1993.    Attended [employer] and [employer], some college credits attained.     Social Determinants of Health     Financial Resource Strain: High Risk   ??? Difficulty of Paying Living Expenses: Very hard   Food Insecurity: Food Insecurity Present   ??? Worried About Programme researcher, broadcasting/film/video in the Last Year: Sometimes true   ??? Ran Out of Food in the Last Year: Sometimes true   Transportation Needs: Unmet Transportation Needs   ??? Lack of Transportation (Medical): Yes   ??? Lack of Transportation (Non-Medical): Yes   Physical Activity: Not on file   Stress: Not on file   Social Connections: Not on file       Family Hx:    Family History   Problem Relation Age of Onset   ??? Breast cancer Cousin    ??? Cervical cancer Cousin    ??? Brain cancer Father         Brain tumor   ??? Hyperlipidemia Father    ??? Lupus Mother    ??? Hypertension Mother    ??? Fibromyalgia Mother    ??? Heart disease Mother         aortic valve replacement   ??? Cataracts Mother    ??? Diabetes Mother    ??? Leukemia Maternal Aunt    ??? Lupus Maternal Aunt    ??? Lupus Maternal Aunt        ALLERGIES:    Allergies   Allergen Reactions   ??? Cymbalta [Duloxetine] Other (See Comments)     Metal taste in mouth     ??? Keflex [Cephalexin] Other (See Comments)     rash

## 2021-01-06 NOTE — Unmapped (Signed)
It was a pleasure seeing you today. Below is your visit summary:    **Have your PCP check your blood pressure and pulse lying down, siting and standing up  **Have labs drawn at PCP.  **Schedule your MRI's.  **Follow up 6 months or sooner right if needed.    Shirline Kendle Jannett Celestine PA-C, MPAS  Division of Multiple Sclerosis    **NEW ADDRESS [facility] at [facility]  [address]  [place], [place] [address]    Phone [phone]  Fax [phone]    [facility] is a specialty clinic and there is a need for you to have a  primary care provider  who will take care of your non-neurological health.   Please set this up if you have not already done so.      ?? In case of:  ?? a suspected relapse (new symptoms or worsening existing symptoms, lasting for >24h)  OR  ?? a need for an additional appointment for other reasons  OR   ?? if you have other questions: please contact:       [facility] Desk   Phone: [phone]      ?? If you have questions for our  pharmacist, please call:      [name], PharmD, CPP  Phone: [phone]      ?? If you need financial assistance, please call:      Financial Assistance Unit      Phone: [phone]

## 2021-01-27 MED ORDER — PREGABALIN 75 MG CAPSULE
ORAL_CAPSULE | 4 refills | 0 days
Start: 2021-01-27 — End: ?

## 2021-01-29 NOTE — Unmapped (Signed)
[facility] Refill Coordination Note    Specialty Medication(s) to be Shipped:   Neurology: Aubagio    Other medication(s) to be shipped: No additional medications requested for fill at this time     Nawal Burling, DOB: [birth year]  Phone: [phone] (home)       All above HIPAA information was verified with patient.     Was a Nurse, learning disability used for this call? No    Completed refill call assessment today to schedule patient's medication shipment from the [facility] [phone]).  All relevant notes have been reviewed.     Specialty medication(s) and dose(s) confirmed: Regimen is correct and unchanged.   Changes to medications: Enola reports no changes at this time.  Changes to insurance: No  New side effects reported not previously addressed with a pharmacist or physician: None reported  Questions for the pharmacist: No    Confirmed patient received a Conservation officer, historic buildings and a Surveyor, mining with first shipment. The patient will receive a drug information handout for each medication shipped and additional FDA Medication Guides as required.       DISEASE/MEDICATION-SPECIFIC INFORMATION        N/A    SPECIALTY MEDICATION ADHERENCE     Medication Adherence    Patient reported X missed doses in the last month: 0  Specialty Medication: Aubagio  Patient is on additional specialty medications: No  Patient is on more than two specialty medications: No  Any gaps in refill history greater than 2 weeks in the last 3 months: no  Demonstrates understanding of importance of adherence: yes  Informant: patient   Other adherence tool: Patient has a bag of medicines that she takes before bed; has routine   Support network for adherence: family member              Were doses missed due to medication being on hold? No    Aubagio 14mg : Patient has 10 days of medication on hand    REFERRAL TO PHARMACIST     Referral to the pharmacist: Not needed      [facility]     Shipping address confirmed in Epic.     Delivery Scheduled: Yes, Expected medication delivery date: 4/22.     Medication will be delivered via Next Day Courier to the prescription address in Epic WAM.    [name]   [facility] Specialty Technician

## 2021-02-03 ENCOUNTER — Encounter: Admit: 2021-02-03 | Discharge: 2021-02-03 | Payer: MEDICARE

## 2021-02-05 MED FILL — AUBAGIO 14 MG TABLET: ORAL | 30 days supply | Qty: 30 | Fill #1

## 2021-02-27 NOTE — Unmapped (Signed)
[facility] Refill Coordination Note    Specialty Medication(s) to be Shipped:   Neurology: Aubagio    Other medication(s) to be shipped: No additional medications requested for fill at this time     Victoria Kelly, DOB: [birth year]  Phone: [phone] (home)       All above HIPAA information was verified with patient.     Was a Nurse, learning disability used for this call? No    Completed refill call assessment today to schedule patient's medication shipment from the [facility] [phone]).  All relevant notes have been reviewed.     Specialty medication(s) and dose(s) confirmed: Regimen is correct and unchanged.   Changes to medications: Itzamara reports no changes at this time.  Changes to insurance: No  New side effects reported not previously addressed with a pharmacist or physician: None reported  Questions for the pharmacist: No    Confirmed patient received a Conservation officer, historic buildings and a Surveyor, mining with first shipment. The patient will receive a drug information handout for each medication shipped and additional FDA Medication Guides as required.       DISEASE/MEDICATION-SPECIFIC INFORMATION        N/A    SPECIALTY MEDICATION ADHERENCE     Medication Adherence    Patient reported X missed doses in the last month: 0  Specialty Medication: Aubagio  Patient is on additional specialty medications: No  Patient is on more than two specialty medications: No  Any gaps in refill history greater than 2 weeks in the last 3 months: no  Demonstrates understanding of importance of adherence: yes  Informant: patient   Other adherence tool: Patient has a bag of medicines that she takes before bed; has routine   Support network for adherence: family member              Were doses missed due to medication being on hold? No    Aubagio 14mg : Patient has 7 days of medication on hand     REFERRAL TO PHARMACIST     Referral to the pharmacist: Not needed      [facility]     Shipping address confirmed in Epic.     Delivery Scheduled: Yes, Expected medication delivery date: 5/20.     Medication will be delivered via Next Day Courier to the prescription address in Epic WAM.    Victoria Kelly   [facility] Specialty Technician

## 2021-03-05 MED FILL — AUBAGIO 14 MG TABLET: ORAL | 30 days supply | Qty: 30 | Fill #2

## 2021-03-09 NOTE — Unmapped (Signed)
Pre call completed for patient MRI with ADS scheduled for tomorrow 5/24.  Pt reports she has had ADS in the past.  Pt is aware she must bring a driver with her who must accompany her into the building, she is to be NPO 2 hours before, and that she will be getting light sedation not general anesthesia.  Pt aware she should arrive 1 hour prior to the appointment.

## 2021-03-23 DIAGNOSIS — G35 Multiple sclerosis: Principal | ICD-10-CM

## 2021-03-23 DIAGNOSIS — N3281 Overactive bladder: Principal | ICD-10-CM

## 2021-03-23 NOTE — Unmapped (Signed)
patient request to cancel appointment will callback to reschedule

## 2021-03-24 MED ORDER — OXYBUTYNIN CHLORIDE ER 5 MG TABLET,EXTENDED RELEASE 24 HR
ORAL_TABLET | 1 refills | 0 days | Status: CP
Start: 2021-03-24 — End: ?

## 2021-03-24 NOTE — Unmapped (Signed)
Last Visit Date: 01/06/2021  Next Visit Date: 06/30/2021      Last seen [facility]  Lab Results   Component Value Date    HIV Antigen/Antibody Combo Nonreactive 02/10/2012        Results for orders placed during the hospital encounter of 08/21/19    MRI Brain Wo Contrast    Narrative  EXAM: Magnetic resonance imaging, brain, without contrast material.  DATE: 08/21/2019  ACCESSION: [id number]  DICTATED: 08/21/2019 1:10 PM  INTERPRETATION LOCATION: Main Campus    CLINICAL INDICATION: 50 years old Female with MS ; Multiple sclerosis, monitor  - G35 - Multiple sclerosis (CMS - HCC)    COMPARISON: Today's MRI cervical and thoracic spine without contrast. MRI brain cervical thoracic spine with and without contrast 03/01/2018.    TECHNIQUE: Multiplanar, multisequence MR imaging of the brain was performed without I.V. contrast.    FINDINGS:    There are multiple foci of T2/FLAIR hyperintensity in the infratentorial, juxtacortical and periventricular white matter, which appear unchanged. Some of the lesions demonstrate corresponding T1 black holes consistent with axonal loss. No new lesions.    The optic nerves are normal in appearance.    Mild diffuse brain volume loss. Ventricles are normal in size. There is no evidence of intracranial hemorrhage, acute infarct, or mass. Sequela of bilateral cataract surgery.    Impression  Multiple white matter lesions in a distribution consistent with multiple sclerosis, unchanged.      Results for orders placed during the hospital encounter of 08/21/19    MRI Thoracic Spine Wo Contrast    Narrative  EXAM: Magnetic resonance imaging, spinal canal and contents, thoracic, without contrast material.  DATE: 08/21/2019 1:19 PM  ACCESSION: [id number]  DICTATED: 08/21/2019 2:22 PM  INTERPRETATION LOCATION: Main Campus    CLINICAL INDICATION: 50 years old Female with MS  - G35 - Multiple sclerosis (CMS - HCC)    COMPARISON: 03/01/2018 MRI thoracic spine and prior.    TECHNIQUE: Multiplanar MRI was performed through the thoracic spine without contrast administration    FINDINGS:  The T1 level is outside field of view.    Diffusely T1 hypointense appearance of the bone marrow is nonspecific. Redemonstration of Modic type III endplate degenerative changes along the anterior disc space at the T11-12 level.    Small multilevel posterior disc bulges. No significant spinal canal or neural foraminal narrowing.    The vertebral bodies are normally aligned. Disc spaces are preserved.    Normal caliber thoracic cord. 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 No focal cord lesions.    Similar appearance of 2.6 cm left adrenal nodule (20:25).    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 No cord lesions.    2.7 cm left adrenal nodule, unchanged.      Results for orders placed during the hospital encounter of 08/21/19    MRI Cervical Spine Wo Contrast    Narrative  EXAM: Magnetic resonance imaging, spinal canal and contents, cervical without contrast material.  DATE: 08/21/2019 1:19 PM  ACCESSION: [id number] UN  DICTATED: 08/21/2019 1:56 PM  INTERPRETATION LOCATION: Main Campus    CLINICAL INDICATION: 50 years old Female with MS  - G35 - Multiple sclerosis (CMS - HCC)    COMPARISON: 03/01/2018 MRI cervical spine and prior.    TECHNIQUE: Multiplanar multisequence MRI was performed through the cervical spine without intravenous contrast.    FINDINGS:  Image quality is degraded by motion artifact.    Normal alignment with slight reversal of cervical  lordosis. Vertebral body heights are preserved. Multilevel endplate degenerative spurring, desiccation and mild disc space loss most prominent at the C4-5 level.    Redemonstration of multilevel degenerative changes most notable at C4-C5 where there is a central disc protrusion which has mildly increased, resulting in mild to moderate spinal canal narrowing. No significant spinal canal or neural foraminal narrowing at the remaining cervical levels.    Normal caliber cervical cord. T2 hyperintense foci within the right lateral cord at the C2 and C3 level are unchanged (18:6, 11). No new lesions.    Impression  Right lateral cord lesion at the C3 level is unchanged. No new lesions.    Mildly increased disc protrusion at C4-C5 resulting mild to moderate spinal canal narrowing.

## 2021-04-01 NOTE — Unmapped (Signed)
[facility] Refill Coordination Note    Specialty Medication(s) to be Shipped:   Neurology: Aubagio    Other medication(s) to be shipped: No additional medications requested for fill at this time     Demiyah Fischbach, DOB: 11-28-1970  Phone: [phone] (home)       All above HIPAA information was verified with patient.     Was a Nurse, learning disability used for this call? No    Completed refill call assessment today to schedule patient's medication shipment from the [facility] [phone]).  All relevant notes have been reviewed.     Specialty medication(s) and dose(s) confirmed: Regimen is correct and unchanged.   Changes to medications: Tiyanna reports no changes at this time.  Changes to insurance: No  New side effects reported not previously addressed with a pharmacist or physician: None reported  Questions for the pharmacist: No    Confirmed patient received a Conservation officer, historic buildings and a Surveyor, mining with first shipment. The patient will receive a drug information handout for each medication shipped and additional FDA Medication Guides as required.       DISEASE/MEDICATION-SPECIFIC INFORMATION        N/A    SPECIALTY MEDICATION ADHERENCE     Medication Adherence    Patient reported X missed doses in the last month: 0  Specialty Medication: aubagio  Patient is on additional specialty medications: No  Patient is on more than two specialty medications: No  Any gaps in refill history greater than 2 weeks in the last 3 months: no  Demonstrates understanding of importance of adherence: yes  Informant: patient   Other adherence tool: Patient has a bag of medicines that she takes before bed; has routine   Support network for adherence: family member              Were doses missed due to medication being on hold? No    Aubagio 14mg : Patient has 7 days of medication on hand    REFERRAL TO PHARMACIST     Referral to the pharmacist: Not needed      [facility]     Shipping address confirmed in Epic.     Delivery Scheduled: Yes, Expected medication delivery date: 6/21.     Medication will be delivered via Next Day Courier to the prescription address in Epic WAM.    [name]   [facility] Specialty Technician

## 2021-04-06 MED FILL — AUBAGIO 14 MG TABLET: ORAL | 30 days supply | Qty: 30 | Fill #3

## 2021-04-30 ENCOUNTER — Encounter: Admit: 2021-04-30 | Discharge: 2021-05-01 | Payer: MEDICARE

## 2021-04-30 DIAGNOSIS — G35 Multiple sclerosis: Principal | ICD-10-CM

## 2021-04-30 DIAGNOSIS — F32A Depression, unspecified depression type: Principal | ICD-10-CM

## 2021-04-30 DIAGNOSIS — K911 Postgastric surgery syndromes: Principal | ICD-10-CM

## 2021-04-30 DIAGNOSIS — Z0000 Encounter for general adult medical examination without abnormal findings: Principal | ICD-10-CM

## 2021-04-30 DIAGNOSIS — G4700 Insomnia, unspecified: Principal | ICD-10-CM

## 2021-04-30 DIAGNOSIS — K219 Gastro-esophageal reflux disease without esophagitis: Principal | ICD-10-CM

## 2021-04-30 MED ORDER — PANTOPRAZOLE 40 MG TABLET,DELAYED RELEASE
ORAL_TABLET | Freq: Every day | ORAL | 3 refills | 90 days | Status: CP
Start: 2021-04-30 — End: ?

## 2021-04-30 MED ORDER — PAROXETINE 30 MG TABLET
ORAL_TABLET | Freq: Every evening | ORAL | 2 refills | 30 days | Status: CP
Start: 2021-04-30 — End: 2021-05-30

## 2021-04-30 MED ORDER — TRAZODONE 50 MG TABLET
ORAL_TABLET | Freq: Every evening | ORAL | 3 refills | 90.00000 days | Status: CP
Start: 2021-04-30 — End: ?

## 2021-04-30 NOTE — Unmapped (Signed)
Internal Medicine Initial Visit        Assessment/Plan:     Victoria Kelly presents today to establish care.             1. Gastroesophageal reflux disease, unspecified whether esophagitis present    2. MS (multiple sclerosis) (CMS-HCC)    3. Depression, unspecified depression type    4. Insomnia, unspecified type    5. Healthcare maintenance    6. Postsurgical dumping syndrome        1. Multiple sclerosis: Follows with [facility] neurology. Reports that symptoms are stable. Some days she notices that her balance is off. She does not think that Aubagio is helping significantly.  -cont Aubagio per neuro (was started on 03/2017).  -Check CBC/diff and CMP today  -has yearly scheduled MRI of the  Brain, Cervical  and Thoracic spine with / without contrast.  (scheduled for 05/26/21)    2. Obesity s/p bariatric surgery 2015: Continues to experience nausea and vomiting daily. Was initially told that she would lose >100lbs following surgery, but only lost 60 of which she regained 30lbs.  She notes that she usually only eats one meal a day, and she often vomits most of what she ate. She has been seen by Bariatric surgeon Dr. Lambert Mody to evaluate for further surgical management and she was told that she owuld have to complete 6 mo of weight management classes, which she cannot afford at this time. Endoscopy performed January 2022 with normal findings.   - Check CMP for electrolyte imbalance due to prolonged episodes of vomiting and dysregulated PO intake  - referral placed for Gastroenterology to evaluate for medical management of daily vomiting and weight loss attempts    3. Depression and Insomnia: worsened in current living situation. She is currently living with her mom in [place], but will soon move to join her fiance in [place]. She is looking forward to the move. Patient feels safe where she is currently living, she denies SI/HI.  - continue Paxil and trazodone    4. HM:   - Needs 2nd COVID booster (did not want moderna vaccine today in clinic, would prefer pfizer)  - Needs Shingles vaccine  - Ordered FOBT    Staffed with Dr. Curlene Dolphin, seen and discussed    Return in about 6 months (around 10/31/2021) for In-person.        Chief Complaint:      Victoria Kelly is a 50 y.o. female who presents to Establish Care for multiple chronic health concerns including Multiple Sclerosis, depression, and obesity s/p gastric sleeve.      Subjective:     HPI    Victoria Kelly is a 50 y.o. woman with history of MS diagnosed in 2003, depression, insomnia, and bariatric surgery. She recently underwent bilateral cataract surgery with excellent effect.  She continues on aubagio, for MS, but does not note significant benefit. She has intermittent weakness and days of feeling off-balance. She is currently living with her mom and step-father and she notes that this is stress-inducing because her mom is often unkind to her sons and grandson. She will be moving to [place] with her fiance in august. She feels safe in her living situation, but she does think that her depression has been exacerbated. She also notes frustration that she has not been able to reach her goal weight of 180-220lbs following bariatric surgery in 2015. She has tried to attend weight management classes and eat the recommended diet, but the classes have  been cost-prohibitive, and she does not follow a regular diet due to nausea and vomiting with eating. She says that she often only eats once a day and that this triggers a daily episode of vomiting. Upon presentation today she does not note nausea, abdominal pain, headache, dizziness, shortness of breath, or chest pain.     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 ??Was evaluated in ER, treated with course of IV steroids at home. ??Does not remember details.  Hosptilaized??03/23-03/26/2021??for??sensory symptoms.??Balance??was??off and she was having pain??in??her??feet.??Received three days of IV methylprednisolone   ??  MS MEDICATION:  Copaxone 2011.  Aubagio 03/2017.      The past medical history, surgical history, family history, social history, medications and allergies were reviewed in Epic.     Review of Systems    The balance of 10 systems was reviewed and unremarkable except as stated above.        Objective:     Resp 18  - Ht 170.2 cm (5' 7)  - Wt (!) 140.6 kg (310 lb)  - BMI 48.55 kg/m??      General: well appearing patient with obesity  HEENT: head normal and atraumatic, no thyromegaly, no palpable cervical lymph nodes  Eyes: EOMI, PERRL  Cardiovascular: Normal rate and rhythm  Pulmonary: lungs clear to auscultation bilaterally  Gastrointestinal: soft and non-tender to palpation  Musculoskeletal: ROM intact for upper and lower extremities bilaterally  Skin: no rashes or lesions          Records Review     PHQ-9 Score:  PHQ-9 TOTAL SCORE: 3  [name]-7 Score:  [name]-7 Total Score: 6      Medication adherence and barriers to the treatment plan have been addressed. Opportunities to optimize healthy behaviors have been discussed. Patient / caregiver voiced understanding.

## 2021-04-30 NOTE — Unmapped (Signed)
[facility] Internal Medicine at [facility] Card was given to patient  Type of visit:  face to face    Reason for visit: F?U patient stated medication refills    Questions / Concerns that need to be addressed:     General Consent to Treat (GCT) for non-epic video visits only: Verbal consent    o     Screening BP 116/86 P61    Omron BPs (complete if screening BP has a systolic  > 139 or diastolic > 89)  BP#1    BP#2   BP#3     Average BP   (please note this as a comment in vitals)     Allergies reviewed: Yes    Medication reviewed: Yes  Pended refills? Yes    HCDM reviewed and updated in Epic:    We are working to make sure all of our patients??? wishes are updated in Epic and part of that is documenting a Environmental health practitioner for each patient  A Health Care Decision Maker is someone you choose who can make health care decisions for you if you are not able - who would you most want to do this for you????  was updated.    HCDM (patient stated preference): Poteat,Chanel - Daughter - [phone]      BPAs completed:  PHQ2  PHQ9  [name]7  AUDIT - Alcohol Screen  HARK - Interpersonal Violence      COVID-19 Vaccine Summary  Which COVID-19 Vaccine was administered  Pfizer  Type:  Dates Given:  08/26/2020                       Immunization History   Administered Date(s) Administered   ??? COVID-19 VACC,MRNA,(PFIZER)(PF)(IM) 01/16/2020, 02/06/2020, 08/26/2020   ??? INFLUENZA TIV (TRI) PF (IM) 08/17/2007, 08/20/2008, 11/20/2009, 07/09/2011   ??? Influenza Vaccine Quad (IIV4 PF) 81mo+ injectable 08/25/2015, 11/12/2016, 07/08/2017, 07/14/2020   ??? Influenza Vaccine Quad (IIV4 W/PRESERV) 71MO+ 07/24/2013   ??? PNEUMOCOCCAL POLYSACCHARIDE 23 02/10/2012   ??? TdaP 03/14/2012, 08/25/2015       __________________________________________________________________________________________    SCREENINGS COMPLETED IN FLOWSHEETS    HARK Screening       AUDIT       PHQ2       PHQ9          P4 Suicidality Screener                [name]7 COPD Assessment       Falls Risk

## 2021-05-01 NOTE — Unmapped (Signed)
[facility]  [address]  [place], [address]  Phone: [phone]  Toll Free: [phone]  Fax: [phone]    Thank you for choosing [facility] Clinic for your care.    Important Numbers    Main Clinic: [phone] or toll free [phone]    After Hours, Weekend, or Holidays:  Call the [facility] - 24/7 Nursing Line [phone] to get nurse advice.  Go to [facility]gent Care walk-in clinic at [address], [address], [place], [place]; [phone]; 7 days a week from 9:00AM - 8:00PM.  Go to [facility] walk-in clinic at [address], [place], [place] [address]; [phone]; 7 days a week from 8:00PM - 8:00PM  Go to [facility] Care at [facility] at [address] [place], [place]; [phone]; Mon-Fri 7:00AM-9:00PM, Sat-Sun 12:00PM-5:00PM  Go to [facility]gent Care for sprains and strains, joint pain, sports injuries and possible fractures at [address], [place], [place]; [phone]; Mon-Thurs 8:00AM-7:00PM, Fri 8:00AM-5:00PM    [facility] Internal Medicine at [employer]: [phone]  6th Floor/Internal Medicine Clinic: Ivor Costa [phone]  Ardean Larsen (bilingual): [phone]    [place] Pharm[facility] Assistance([place] PAP): [phone], choose option 2  [facility]: [phone] *Pharmacy can mail medications to your home. You must call to request the medication be mailed.[facility]: [phone]  [facility]: [phone]    [facility] Care Management: Are you having trouble with you health because of cost, your mood, trouble getting to clinic, or where you live. Our Care Management team can help!  Available to assist with requests sent via mychart or by calling the main clinic number at [phone]  Monday-Friday 8:00AM - 5:00PM    [facility]  I'm feeling unsafe, have experienced physical abuse, threats, emotional abuse, sexual abuse or other violence. Who do I call for confidential advice and assistance?  Call [phone] Monday through Fridays 9:00am-4:30pm. Call [phone] after hours.    Same Day Clinic   I'm sick today and need an appointment during office hours. Who do I call?  Call [phone], ask for an appointment in the Same Day Clinic  Same Day Clinic is located on the 5th floor at [address], [place] [address]    How do I request medication refills?  Request a refill via [facility] (patient portal), call clinic at [phone] or have your pharmacy fax the request to [phone].    We highly encourage those with internet access to sign up for My [facility] Chart, our new patient portal service.  This service is free to all [facility] patients and offers the following benefits:  Secure messaging with your care team  Request appointments/cancel appointments  Access test results  Request prescription refills  Pay bills online  Manage the health of loved ones  Track your health    Sign up for your My [facility] Chart account at [email or web address].  Free Android and iOs smartphone and tablet applications are also available for your convenience.    How to reset you [facility] Chart  If you forget your My [facility] Chart username or password, select ???forgot username??? or ???forgot password??? located under the ???sign in??? button on the login page. If you do not remember the information required to reset your password, you can contact [facility] at [phone]  to have your My [facility] Chart password reset.    Scheduling appointments Online  The [facility] MyChart secure website and app makes it easy for you to schedule appointments. You can schedule most primary care clinic appointments online with providers you have seen before. Log in to [email or web address] and select Visits to schedule an appointment today

## 2021-05-04 NOTE — Unmapped (Signed)
The [facility] has made a second and final attempt to reach this patient to refill the following medication:Aubagio.      We have left voicemails on the following phone numbers: [phone] and have sent a MyChart message.    Dates contacted: 7/8, 7/18  Last scheduled delivery: 6/20    The patient may be at risk of non-compliance with this medication. The patient should call the [facility] at [phone] (option 4) to refill medication.    [name]   [facility] Specialty Technician

## 2021-05-14 NOTE — Unmapped (Signed)
The [facility] has made a third and final attempt to reach this patient to refill the following medication: Aubagio.      We have left voicemails on the following phone numbers: [phone] and have sent a MyChart message.    Dates contacted: 7/8, 7/18, 7/28  Last scheduled delivery: 6/21    The patient may be at risk of non-compliance with this medication. The patient should call the [facility] at [phone] (option 4) to refill medication.    Arnold Long   [facility] Specialty Pharmacist

## 2021-05-15 NOTE — Unmapped (Signed)
Neurology Clinical Pharmacist Telephone Call    Medication: Victoria Kelly       This is [facility]'s 1st attempt to contact patient regarding delivery of Aubagio from [facility]. Previously the [facility] team have called and attempted to set-up delivery x3.  Unable to reach patient but left a HIPAA compliant voicemail with number for the [facility] [phone], option 4). Will follow-up again in 2-3 business days.    [name], PharmD, CPP  Clinical Pharmacist, [facility]  Phone: [phone]

## 2021-05-20 NOTE — Unmapped (Signed)
Neurology Clinical Pharmacist Telephone Call    Medication: [name]       This is [facility]'s 1st attempt to contact patient regarding delivery of Aubagio from [facility]. Previously the [facility] team have called and attempted to set-up delivery x3. Reached patient and transferred her directly to [facility] pharmacist [place].    [name], PharmD, CPP  Clinical Pharmacist, [facility]  Phone: [phone]

## 2021-05-20 NOTE — Unmapped (Signed)
[facility] Clinical Assessment & Refill Coordination Note    Victoria Kelly, DOB: 11/07/1970  Phone: [phone] (home)     All above HIPAA information was verified with patient.     Was a Nurse, learning disability used for this call? No    Specialty Medication(s):   Neurology: Aubagio     Current Outpatient Medications   Medication Sig Dispense Refill   ??? acetaminophen (TYLENOL) 500 MG tablet Take 2 tablets (1,000 mg total) by mouth every eight (8) hours as needed for pain. 30 tablet 0   ??? ergocalciferol-1,250 mcg, 50,000 unit, (DRISDOL) 1,250 mcg (50,000 unit) capsule Take 1 capsule (1,250 mcg total) by mouth once a week. On Thursdays 4 capsule 11   ??? ferrous sulfate 325 (65 FE) MG tablet Take 1 tablet (325 mg total) by mouth daily. 90 tablet 3   ??? fluticasone (FLONASE) 50 mcg/actuation nasal spray 2 sprays by Each Nare route daily. 16 g 6   ??? melatonin 5 mg tablet Take 5 mg by mouth nightly as needed.     ??? multivitamin (TAB-A-VITE/THERAGRAN) per tablet Take 1 tablet by mouth daily. 90 tablet 3   ??? oxybutynin (DITROPAN-XL) 5 MG 24 hr tablet TAKE 1 TABLET BY MOUTH EVERY MORNING 90 tablet 1   ??? pantoprazole (PROTONIX) 40 MG tablet Take 1 tablet (40 mg total) by mouth daily. 90 tablet 3   ??? PARoxetine (PAXIL) 30 MG tablet Take 2 tablets (60 mg total) by mouth every evening. 60 tablet 2   ??? pregabalin (LYRICA) 75 MG capsule TAKE TWO (2) CAPSULES BY MOUTH TWICE DAILY 120 capsule 4   ??? teriflunomide (AUBAGIO) 14 mg Tab Take 1 tablet (14 mg total) by mouth every evening. 30 tablet 5   ??? traZODone (DESYREL) 50 MG tablet Take 1 tablet (50 mg total) by mouth nightly. 90 tablet 3     No current facility-administered medications for this visit.        Changes to medications: [name] reports no changes at this time.    Allergies   Allergen Reactions   ??? Cymbalta [Duloxetine] Other (See Comments)     Metal taste in mouth     ??? Keflex [Cephalexin] Other (See Comments)     rash       Changes to allergies: No    SPECIALTY MEDICATION ADHERENCE     Aubagio 14 mg: ~7 days of medicine on hand        Medication Adherence    Patient reported X missed doses in the last month: 0  Specialty Medication: Aubagio  Patient is on additional specialty medications: No  Informant: patient   Other adherence tool: Patient has a bag of medicines that she takes before bed; has routine   Support network for adherence: family member          Specialty medication(s) dose(s) confirmed: Regimen is correct and unchanged.     Are there any concerns with adherence? No    Adherence counseling provided? Not needed    CLINICAL MANAGEMENT AND INTERVENTION      Clinical Benefit Assessment:    Do you feel the medicine is effective or helping your condition? Yes    Clinical Benefit counseling provided? Not needed    Adverse Effects Assessment:    Are you experiencing any side effects? No    Are you experiencing difficulty administering your medicine? No    Quality of Life Assessment:    Quality of Life    Rheumatology  On a scale of 1 - 10 with 1 representing not at all and 10 representing completely - how has your rheumatologic condition affected your:  Oncology  Dermatology          How many days over the past month did your MS  keep you from your normal activities? For example, brushing your teeth or getting up in the morning. 0    Have you discussed this with your provider? Not needed    Acute Infection Status:    Acute infections noted within Epic:  No active infections  Patient reported infection: None    Therapy Appropriateness:    Is therapy appropriate? Yes, therapy is appropriate and should be continued    DISEASE/MEDICATION-SPECIFIC INFORMATION      N/A    PATIENT SPECIFIC NEEDS     - Does the patient have any physical, cognitive, or cultural barriers? No    - Is the patient high risk? No    - Does the patient require a Care Management Plan? No     - Does the patient require physician intervention or other additional services (i.e. nutrition, smoking cessation, social work)? No      SHIPPING     Specialty Medication(s) to be Shipped:   Neurology: Aubagio    Other medication(s) to be shipped: No additional medications requested for fill at this time     Changes to insurance: No    Delivery Scheduled: Yes, Expected medication delivery date: 05/26/21.     Medication will be delivered via Next Day Courier to the confirmed prescription address in [facility].    The patient will receive a drug information handout for each medication shipped and additional FDA Medication Guides as required.  Verified that patient has previously received a Conservation officer, historic buildings and a Surveyor, mining.    The patient or caregiver noted above participated in the development of this care plan and knows that they can request review of or adjustments to the care plan at any time.      All of the patient's questions and concerns have been addressed.    Arnold Long   [facility] Specialty Pharmacist

## 2021-05-25 MED FILL — AUBAGIO 14 MG TABLET: ORAL | 30 days supply | Qty: 30 | Fill #4

## 2021-05-26 ENCOUNTER — Ambulatory Visit: Admit: 2021-05-26 | Discharge: 2021-05-27 | Payer: MEDICARE

## 2021-05-26 MED ADMIN — gadoterate meglumine (DOTAREM) Soln 20 mL: 20 mL | INTRAVENOUS | @ 22:00:00 | Stop: 2021-05-26

## 2021-05-26 MED ADMIN — diazePAM (VALIUM) injection 5 mg: 5 mg | INTRAVENOUS | @ 20:00:00 | Stop: 2021-05-26

## 2021-05-26 MED ADMIN — diazePAM (VALIUM) injection 5 mg: 5 mg | INTRAVENOUS | @ 22:00:00 | Stop: 2021-05-26

## 2021-05-26 NOTE — Unmapped (Signed)
The medicines given to you today for your procedure will stay in your body for some time.   You may feel dizzy or lose your sense of balance. The use of your muscles may be changed.   Your judgment may be affected. Your reaction time will be slower.   You may not see any of these changes in yourself. In general, you should completely recover from these medicines by tomorrow.   For your own safety, we have some strict rules for you to follow for the next 12 hours.  Rest, participate in only light activity.  Be careful getting up and change position slowly, as you may feel dizzy.  Do not drive operate heavy machinery.  Do not sign any legal documents or make important decisions.  Do not drink any alcohol or take any sedating medications, this can be very dangerous.  Diet: If you feel nauseated or sick to your stomach follow a clear liquid diet- tea, ginger ale, broth etc. for the rest of the day and  Resume your normal diet tomorrow.  Any questions or concern, please notify your primary doctor or the doctor who ordered this test.

## 2021-06-08 MED ORDER — PREGABALIN 75 MG CAPSULE
ORAL_CAPSULE | 4 refills | 0 days | Status: CP
Start: 2021-06-08 — End: ?

## 2021-06-11 NOTE — Unmapped (Signed)
Called patient to reschedule 9/13 appointment unable to accept calls per recording. Text was sent.

## 2021-06-12 NOTE — Unmapped (Signed)
Mychart message and text sent regarding rescheduling 9/13 appointment. Phone number on chart is not accepting calls at this time per recording.

## 2021-06-18 NOTE — Unmapped (Signed)
Called patient regarding about changing the appointment to virtual, phone number not in service ,sent Mychart message.

## 2021-07-05 NOTE — Unmapped (Incomplete)
[facility]  Multiple Sclerosis / Neuroimmunology Division  [facility]  Physician Assistant, [place], [place]  Phone: [phone]  Fax: [phone]      Patient Name: Victoria Kelly   Date of Birth: [birth year]  Medical Record Number: [id number]  [address]  [place] [address]     Direct entry by:  Cy Blamer, PA-C, MPAS.  Supervising Physician: Dr. Desma Mcgregor, Dr. Quillian Quince.    DATE OF VISIT: July 06, 2021.    REASON FOR PATIENT OUTREACH /  Phone CALL ENCOUNTER: Follow up for evaluation of  Multiple Sclerosis / Demyelinating Disease / or other Neurological problem.          The patient reports they are currently: at home. I spent 25 minutes on the phone visit with the patient on the date of service. I spent an additional 5 minutes on pre- and post-visit activities on the date of service.     The patient was not located and I was not located within 250 yards of a hospital based location during the phone visit. The patient was physically located in [place] or a state in which I am permitted to provide care. The patient and/or parent/guardian understood that s/he may incur co-pays and cost sharing, and agreed to the telemedicine visit. The visit was reasonable and appropriate under the circumstances given the patient's presentation at the time.    The patient and/or parent/guardian has been advised of the potential risks and limitations of this mode of treatment (including, but not limited to, the absence of in-person examination) and has agreed to be treated using telemedicine. The patient's/patient's family's questions regarding telemedicine have been answered.    If the visit was completed in an ambulatory setting, the patient and/or parent/guardian has also been advised to contact their provider???s office for worsening conditions, and seek emergency medical treatment and/or call 911 if the patient deems either necessary.    ASSESSMENT AND PLAN:  History of overactive bladder, Vitamin D deficiency, Obstructive Sleep Apnea, anxiety, and Iron deficient anemia.    ** Relapsing Remitting Multiple Sclerosis.   -Started Aubagio 03/2017.   -Reviewed  with the patient 05/26/21  MRI reports of Brain, Cervical  and Thoracic spine,  with / without contrast,  compared to 08/21/2019.  which showed no changes.  -Reviewed CBC/diff, CMP and Vit D from 04/30/2021.    **Vitamin D deficiency:  -Start Vitamin D3 4,000 units daily when you finish the weekly. This is over the counter.    **Headaches:  -Continue Tylenol and monitor,    **Continue Lyrica to 150mg  BID, Oxybutynin XR 5mg .    Follow up 6 months, face to face    INTERVAL HISTORY / CHIEF COMPLAINT :  Patient reports no MS relapses since the last visit 01/06/21.  Patient reports that they are taking DMT appropriately and are not experiencing any side effects.    Complaining of headaches. The headaches are located biateral frontal and no radiatation. They are described as sharp.  Associated symptoms are none. They occur twice a week and last for one hour. On a scale of 1 to 10 they are 10.   These medications have been tried and failed Tylenol . Lying down helps.  Babysit's her 5 year old grandson.    Reports that her balance is off since last visit that comes and goes.    IMMUNIZATIONS / MAB / COVID infections:  Received COVID-19 vaccine, Pfizer on 01/16/2020 and 02/06/2020 and booster 08/26/2020.  Gait: normal stride, normal base, normal armswing. Able to tandem, heel, and toe gait without difficulty. Negative Romberg.      PRIOR HISTORY: A 50 y.o. African American female. She is here with her daughter and grandson 25 months and 31 month odf nephew.  Ms. Bobst has been diagnosed with MS in 2003.  Seen by Dr. Johnnye Lana on 01/16/2018. This is from her notes.  In 2003 she had left sided ON. Right sided ON occurred after a month. She now has color vision decrease. She also has a cataract (2006/2007), and did the surgery. In the following years she had an episode when she could not feel cold/warm on one side of her body. She got better but not completely. She further had an episode of right sided weakness and received steroids and recovered. 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   She was initially treated with Copaxone; however, discontinued in 2011 (per notes by Dr. Hunt Oris).   Started with Aubagio at the end of June 2018.     LUMBAR PUNCTURE:  Per notes by Dr. Alessandra Bevels on 04/18/2007 'she had a negative lumbar puncture' but I have no results available for my review.     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  Was evaluated in ER, treated with course of IV steroids at home.  Does not remember details.  Hosptilaized 03/23-03/26/2021 for sensory symptoms. Balance was off and she was having pain in her feet. Received three days of IV methylprednisolone     MS MEDICATION:  Copaxone 2011.  Aubagio 03/2017.    GYN HISTORY HISTORY:  No LMP recorded. Patient has had a hysterectomy.    REVIEW OF SYSTEMS:  A 10-systems review was performed and, unless otherwise noted, declared negative by patient.    No visits with results within 1 Month(s) from this visit.   Latest known visit with results is:   Office Visit on 04/30/2021   Component Date Value Ref Range Status   ??? Sodium 04/30/2021 140  135 - 145 mmol/L Final   ??? Potassium 04/30/2021 4.2  3.4 - 4.8 mmol/L Final   ??? Chloride 04/30/2021 107  98 - 107 mmol/L Final   ??? CO2 04/30/2021 30.0  20.0 - 31.0 mmol/L Final   ??? Anion Gap 04/30/2021 3 (A) 5 - 14 mmol/L Final   ??? BUN 04/30/2021 11  9 - 23 mg/dL Final   ??? Creatinine 04/30/2021 1.17 (A) 0.60 - 0.80 mg/dL Final   ??? BUN/Creatinine Ratio 04/30/2021 9   Final   ??? eGFR [name]-EPI (2021) Female 04/30/2021 57 (A) >=60 mL/min/1.52m2 Final   ??? Glucose 04/30/2021 87  70 - 179 mg/dL Final   ??? Calcium 16/07/9603 9.5  8.7 - 10.4 mg/dL Final   ??? Albumin 54/06/8118 3.4  3.4 - 5.0 g/dL Final   ??? Total Protein 04/30/2021 7.2  5.7 - 8.2 g/dL Final   ??? Total Bilirubin 04/30/2021 0.4  0.3 - 1.2 mg/dL Final   ??? AST 14/78/2956 15  <=34 U/L Final   ??? ALT 04/30/2021 12  10 - 49 U/L Final   ??? Alkaline Phosphatase 04/30/2021 86  46 - 116 U/L Final   ??? WBC 04/30/2021 5.4  3.6 - 11.2 10*9/L Final   ??? RBC 04/30/2021 4.48  3.95 - 5.13 10*12/L Final   ???  HGB 04/30/2021 12.6  11.3 - 14.9 g/dL Final   ??? HCT 57/84/6962 37.7  34.0 - 44.0 % Final   ??? MCV 04/30/2021 84.1  77.6 - 95.7 fL Final   ??? MCH 04/30/2021 28.2  25.9 - 32.4 pg Final   ??? MCHC 04/30/2021 33.5  32.0 - 36.0 g/dL Final   ??? RDW 95/28/4132 13.4  12.2 - 15.2 % Final   ??? MPV 04/30/2021 8.1  6.8 - 10.7 fL Final   ??? Platelet 04/30/2021 263  150 - 450 10*9/L Final   ??? Neutrophils % 04/30/2021 65.4  % Final   ??? Lymphocytes % 04/30/2021 25.7  % Final   ??? Monocytes % 04/30/2021 5.2  % Final   ??? Eosinophils % 04/30/2021 2.9  % Final   ??? Basophils % 04/30/2021 0.8  % Final   ??? Absolute Neutrophils 04/30/2021 3.5  1.8 - 7.8 10*9/L Final   ??? Absolute Lymphocytes 04/30/2021 1.4  1.1 - 3.6 10*9/L Final   ??? Absolute Monocytes 04/30/2021 0.3  0.3 - 0.8 10*9/L Final   ??? Absolute Eosinophils 04/30/2021 0.2  0.0 - 0.5 10*9/L Final   ??? Absolute Basophils 04/30/2021 0.0  0.0 - 0.1 10*9/L Final       No visits with results within 1 Month(s) from this visit.   Latest known visit with results is:   Office Visit on 04/30/2021   Component Date Value Ref Range Status   ??? Sodium 04/30/2021 140  135 - 145 mmol/L Final   ??? Potassium 04/30/2021 4.2  3.4 - 4.8 mmol/L Final   ??? Chloride 04/30/2021 107  98 - 107 mmol/L Final   ??? CO2 04/30/2021 30.0  20.0 - 31.0 mmol/L Final   ??? Anion Gap 04/30/2021 3 (A) 5 - 14 mmol/L Final   ??? BUN 04/30/2021 11  9 - 23 mg/dL Final   ??? Creatinine 04/30/2021 1.17 (A) 0.60 - 0.80 mg/dL Final   ??? BUN/Creatinine Ratio 04/30/2021 9   Final   ??? eGFR [name]-EPI (2021) Female 04/30/2021 57 (A) >=60 mL/min/1.9m2 Final   ??? Glucose 04/30/2021 87  70 - 179 mg/dL Final   ??? Calcium 44/10/270 9.5  8.7 - 10.4 mg/dL Final   ??? Albumin 53/66/4403 3.4  3.4 - 5.0 g/dL Final   ??? Total Protein 04/30/2021 7.2  5.7 - 8.2 g/dL Final   ??? Total Bilirubin 04/30/2021 0.4  0.3 - 1.2 mg/dL Final   ??? AST 47/42/5956 15  <=34 U/L Final   ??? ALT 04/30/2021 12  10 - 49 U/L Final   ??? Alkaline Phosphatase 04/30/2021 86  46 - 116 U/L Final   ??? WBC 04/30/2021 5.4  3.6 - 11.2 10*9/L Final   ??? RBC 04/30/2021 4.48  3.95 - 5.13 10*12/L Final   ??? HGB 04/30/2021 12.6  11.3 - 14.9 g/dL Final   ??? HCT 38/75/6433 37.7  34.0 - 44.0 % Final   ??? MCV 04/30/2021 84.1  77.6 - 95.7 fL Final   ??? MCH 04/30/2021 28.2  25.9 - 32.4 pg Final   ??? MCHC 04/30/2021 33.5  32.0 - 36.0 g/dL Final   ??? RDW 29/51/8841 13.4  12.2 - 15.2 % Final   ??? MPV 04/30/2021 8.1  6.8 - 10.7 fL Final   ??? Platelet 04/30/2021 263  150 - 450 10*9/L Final   ??? Neutrophils % 04/30/2021 65.4  % Final   ??? Lymphocytes % 04/30/2021 25.7  % Final   ??? Monocytes % 04/30/2021 5.2  %  Final   ??? Eosinophils % 04/30/2021 2.9  % Final   ??? Basophils % 04/30/2021 0.8  % Final   ??? Absolute Neutrophils 04/30/2021 3.5  1.8 - 7.8 10*9/L Final   ??? Absolute Lymphocytes 04/30/2021 1.4  1.1 - 3.6 10*9/L Final   ??? Absolute Monocytes 04/30/2021 0.3  0.3 - 0.8 10*9/L Final   ??? Absolute Eosinophils 04/30/2021 0.2  0.0 - 0.5 10*9/L Final   ??? Absolute Basophils 04/30/2021 0.0  0.0 - 0.1 10*9/L Final       PROBLEM LIST:    Patient Active Problem List   Diagnosis   ??? Multiple sclerosis (CMS-HCC)   ??? Dysthymic disorder   ??? Obesity   ??? Obstructive sleep apnea   ??? Weakness of wrist   ??? Ankle arthritis   ??? Allergic rhinitis   ??? Insomnia   ??? Low back pain   ??? Iron deficiency anemia   ??? Acne   ??? H/O cataract removal with insertion of prosthetic lens   ??? Hyperlipidemia   ??? History of bariatric surgery   ??? Elevated serum creatinine   ??? Morbid (severe) obesity due to excess calories (CMS-HCC)   ??? S/P TAH (total abdominal hysterectomy)   ??? Rash     Current Outpatient Medications   Medication Sig Dispense Refill   ??? acetaminophen (TYLENOL) 500 MG tablet Take 2 tablets (1,000 mg total) by mouth every eight (8) hours as needed for pain. 30 tablet 0   ??? ergocalciferol-1,250 mcg, 50,000 unit, (DRISDOL) 1,250 mcg (50,000 unit) capsule Take 1 capsule (1,250 mcg total) by mouth once a week. On Thursdays 4 capsule 11   ??? ferrous sulfate 325 (65 FE) MG tablet Take 1 tablet (325 mg total) by mouth daily. 90 tablet 3   ??? fluticasone (FLONASE) 50 mcg/actuation nasal spray 2 sprays by Each Nare route daily. 16 g 6   ??? melatonin 5 mg tablet Take 5 mg by mouth nightly as needed.     ??? multivitamin (TAB-A-VITE/THERAGRAN) per tablet Take 1 tablet by mouth daily. 90 tablet 3   ??? oxybutynin (DITROPAN-XL) 5 MG 24 hr tablet TAKE 1 TABLET BY MOUTH EVERY MORNING 90 tablet 1   ??? pantoprazole (PROTONIX) 40 MG tablet Take 1 tablet (40 mg total) by mouth daily. 90 tablet 3   ??? PARoxetine (PAXIL) 30 MG tablet Take 2 tablets (60 mg total) by mouth every evening. 60 tablet 2   ??? pregabalin (LYRICA) 75 MG capsule TAKE TWO (2) CAPSULES BY MOUTH TWICE DAILY 120 capsule 4   ??? teriflunomide (AUBAGIO) 14 mg Tab Take 1 tablet (14 mg total) by mouth every evening. 30 tablet 5   ??? traZODone (DESYREL) 50 MG tablet Take 1 tablet (50 mg total) by mouth nightly. 90 tablet 3     No current facility-administered medications for this visit.         Past Surgical Hx:    Past Surgical History:   Procedure Laterality Date   ??? CATARACT EXTRACTION Bilateral 08/14/2003, 09/09/2003   ??? CESAREAN SECTION  01/12/2000   ??? COSMETIC SURGERY     ??? DEBRIDEMENT LEG Left 09/30/1999    Spider bite   ??? ENDOMETRIAL ABLATION  02/11/2005   ??? PR LAP, GAST RESTRICT PROC, LONGITUDINAL GASTRECTOMY Midline 02/15/2014    Procedure: ROBOTIC PR LAP, GAST RESTRICT PROC, LONGITUDINAL GASTRECTOMY;  Surgeon: Aida Raider, MD;  Location: MAIN OR [place];  Service: Gastrointestinal   ??? PR UPPER GI ENDOSCOPY,DIAGNOSIS N/A 11/12/2020  Procedure: ESOPHAGOGASTRODUODENOSCOPY;  Surgeon: San Morelle, MD;  Location: ENDO PROCEDURES [place];  Service: Gastroenterology   ??? TONSILLECTOMY  1982   ??? TOTAL ABDOMINAL HYSTERECTOMY     ??? TUBAL LIGATION  01/12/2000       Social Hx:    Social History     Socioeconomic History   ??? Marital status: Divorced   ??? Number of children: 3   Occupational History   ??? Occupation: Personal assistant: unknown   Tobacco Use   ??? Smoking status: Never Smoker   ??? Smokeless tobacco: Never Used   Vaping Use   ??? Vaping Use: Never used   Substance and Sexual Activity   ??? Alcohol use: No   ??? Drug use: No   ??? Sexual activity: Not Currently   Other Topics Concern   ??? Do you use sunscreen? No   ??? Tanning bed use? No   ??? Are you easily burned? No   ??? Excessive sun exposure? No   Social History Narrative    Single, has 3 children, aged 17-25. Married in 1997, divorced in 2001. Lives with her boyfriend. On disability (r/t Multiple Sclerosis), previously employed as a Interior and spatial designer, bus Hospital doctor. Born and raised in [place]. Attended [employer] in 1993.    Attended [employer] and [employer], some college credits attained.     Social Determinants of Health     Financial Resource Strain: High Risk   ??? Difficulty of Paying Living Expenses: Very hard   Food Insecurity: Food Insecurity Present   ??? Worried About Programme researcher, broadcasting/film/video in the Last Year: Sometimes true   ??? Ran Out of Food in the Last Year: Sometimes true   Transportation Needs: Unmet Transportation Needs   ??? Lack of Transportation (Medical): Yes   ??? Lack of Transportation (Non-Medical): Yes       Family Hx:    Family History   Problem Relation Age of Onset   ??? Breast cancer Cousin    ??? Cervical cancer Cousin    ??? Brain cancer Father         Brain tumor   ??? Hyperlipidemia Father    ??? Lupus Mother    ??? Hypertension Mother    ??? Fibromyalgia Mother    ??? Heart disease Mother         aortic valve replacement   ??? Cataracts Mother ??? Diabetes Mother    ??? Leukemia Maternal Aunt    ??? Lupus Maternal Aunt    ??? Lupus Maternal Aunt        ALLERGIES:    Allergies   Allergen Reactions   ??? Cymbalta [Duloxetine] Other (See Comments)     Metal taste in mouth     ??? Keflex [Cephalexin] Other (See Comments)     rash ALLERGIES:    Allergies   Allergen Reactions   ??? Cymbalta [Duloxetine] Other (See Comments)     Metal taste in mouth     ??? Keflex [Cephalexin] Other (See Comments)     rash

## 2021-07-06 ENCOUNTER — Telehealth
Admit: 2021-07-06 | Discharge: 2021-07-07 | Payer: MEDICARE | Attending: Physician Assistant | Primary: Physician Assistant

## 2021-07-06 DIAGNOSIS — G35 Multiple sclerosis: Principal | ICD-10-CM

## 2021-07-06 MED ORDER — AUBAGIO 14 MG TABLET
ORAL_TABLET | Freq: Every evening | ORAL | 5 refills | 30 days | Status: CP
Start: 2021-07-06 — End: ?

## 2021-07-06 NOTE — Unmapped (Signed)
It was a pleasure talking with you today. Below is your visit summary:    **Start Vitamin D3 4,000 units daily when you finish the weekly. This is over the counter.  **Follow up 6 months.    The [employer] Modification [employer] program for individuals living with MS is now open through April 16, 2022 (flyer attached). Grants are based on meeting eligibility criteria and the availability of funds at the time of a completed application. Please note that these funds can be used for air conditioning.     Link to details and the online application: [employer] Modification Grant. MS Navigators are standing by at [phone] to answer questions and support your patients through the application process.      Navigating the Challenges of MS: MS Navigator??   National MS Society: MS Navigator at [phone]  Finding answers and making decisions relies on having the right information at the right time. MS Navigator are highly skilled, compassionate professionals available Monday through Friday, 9:00 a.m. to 7:00 p.m. ET to connect you to the information, resources and support needed to move your life forward. These supportive partners help navigate the challenges of MS unique to your situation.    Ethlyn Alto Jannett Celestine PA-C, MPAS  Division of Multiple Sclerosis    **NEW ADDRESS [facility]  [address], [address]  [place], [place] [address]    Phone [phone]  Fax [phone]    [facility] is a specialty clinic and there is a need for you to have a  primary care provider  who will take care of your non-neurological health.   Please set this up if you have not already done so.      In case of:  a suspected relapse (new symptoms or worsening existing symptoms, lasting for >24h)  OR  a need for an additional appointment for other reasons  OR   if you have other questions: please contact:       [facility] Desk   Phone: [phone]      If you have questions for our  pharmacist, please call:      [name], PharmD, CPP  Phone: [phone]      If you need financial assistance, please call:      Financial Assistance Unit      Phone: [phone]

## 2021-07-28 DIAGNOSIS — F32A Depression, unspecified depression type: Principal | ICD-10-CM

## 2021-07-28 MED ORDER — PAROXETINE 30 MG TABLET
ORAL_TABLET | Freq: Every evening | ORAL | 2 refills | 30.00000 days
Start: 2021-07-28 — End: 2021-08-27

## 2021-07-29 ENCOUNTER — Ambulatory Visit: Admit: 2021-07-29 | Discharge: 2021-07-30 | Payer: MEDICARE | Attending: Obesity Medicine | Primary: Obesity Medicine

## 2021-07-29 MED ORDER — METFORMIN ER 500 MG TABLET,EXTENDED RELEASE 24 HR
ORAL_TABLET | Freq: Every day | ORAL | 3 refills | 30 days | Status: CP
Start: 2021-07-29 — End: ?

## 2021-07-29 NOTE — Unmapped (Signed)
Ways to improve your sleep  Sleep only long enough to feel rested and then get out of bed  Go to bed and get up at the same time every day  Do not try to force yourself to sleep. If you can't sleep, get out of bed and try again later.  Avoid coffee, tea and other caffeinated drinks after 4 pm.  Avoid alcohol in the late afternoon, evening, and at bedtime  Keep your bedroom dark, cool, quiet, and free of reminders of work or other things that cause you stress  Solve problems you have before you go to bed  Exercise several days a week, but not right before bed. No exercise at least 2 hours before bed.  Avoid watching television, looking at phones or reading devices (e-books) that give off light at least an hour before bed. This can make it harder to fall asleep.  Relaxation therapy, such as Yoga Nidra on YouTube.   Working with a Conservator, museum/gallery to deal with the problems that might be causing poor sleep such as depression, anxiety or past trauma.

## 2021-07-29 NOTE — Unmapped (Signed)
Assessment  summary at the initial visit   AMMY LIENHARD is a 50 y.o. female with a BMI of 50 and s/p vertical sleeve gastrectomy by Dr. Remigio Eisenmenger at [facility] on 02/15/2014.  She presents with inadequate response from surgery.  She has presented with suboptimal initial weight loss of 20% at nadir followed by excessive weight regain of 56%.   The BMI prior to surgery was 54.   Weight gain appears to be related to some genetic factors which could have caused poor response followed by dietary factors such as high intake of processed foods, poor sleep hygiene and inability to exercise..  Present co-morbidities include multiple sclerosis, low back pain, hyperlipidemia, obstructive sleep apnea, dysthymic disorder.     Summary of Post operative results  Patient is s/p vertical sleeve gastrectomy by Dr. Remigio Eisenmenger at [facility] on 02/15/2014.     Preoperative  Wt:348.6 lb       BMI:54.3  Nadir               Wt:281.5 lb      BMI:44.1  Weight at presentation         Wt:(!) 145 kg (319 lb 9.6 oz)       BMI:(!) 50.04     Total weight loss % at nadir:20%  Weight regain % at presentation: 56%      Subjective    Treatment course  Starting weight at presentation:319.6 lb  Weight today:(!) 139.7 kg (308 lb)   Weight loss since last visit about 10 months ago:11.6 lb  Total weight loss since presentation:11.6 lbs   Percent weight loss since presentation:3.6%  Percent weight loss with NA : 3.6 %  Previous use of anti-obesity medications:      Narrative history  ??No c/o   ?  Diet: Skips breakfast and lunch. Eats only meal a day which is a baked meat and some beans and some vegetables. Drinks a lot of ice water and diet soda.    Caloric window:  ?  ?Exercise:Does not get out of the house.  ??  Sleep:Using melatonin to get about 3 hrs of sleep.  ??  Stress: very high at all times.  ???  Medications:No new medications.    Eating disorders:    Claryssa L. Mcarthy has Multiple sclerosis (CMS-HCC); Dysthymic disorder; Obesity; Obstructive sleep apnea; Weakness of wrist; Ankle arthritis; Allergic rhinitis; Insomnia; Low back pain; Iron deficiency anemia; Acne; H/O cataract removal with insertion of prosthetic lens; Hyperlipidemia; History of bariatric surgery; Elevated serum creatinine; Morbid (severe) obesity due to excess calories (CMS-HCC); S/P TAH (total abdominal hysterectomy); and Rash on their problem list.     has a current medication list which includes the following prescription(s): acetaminophen, ergocalciferol-1,250 mcg (50,000 unit), ferrous sulfate, fluticasone propionate, melatonin, multivitamin, oxybutynin, pantoprazole, pregabalin, aubagio, trazodone, and paroxetine.     is allergic to cymbalta [duloxetine] and keflex [cephalexin].      reports that she has never smoked. She has never used smokeless tobacco. She reports that she does not drink alcohol and does not use drugs.     Objective    Vital Signs  BP 159/106  - Pulse 83  - Ht 170.2 cm (5' 7)  - Wt (!) 139.7 kg (308 lb)  - BMI 48.24 kg/m??    BMI:(!) 48.23      Plan  during the initial evaluation on 09/25/2020  Based on my assessment of the possible etiologies for the weight regain, medical co-morbidities and barriers my recommendations  include the following :          Diet:  Patient is a very disrupted eating pattern which is high in processed foods and long.'s of skipping meals.  She tends to drink a lot of artificially sweetened beverages and we talked about the importance of reducing artificial sweeteners as well as processed foods.  She was given the Mediterranean plate and explained the importance of increasing fruit and vegetable intake.         Exercise:  Ability to exercise is limited and we discussed about the use of chair exercises that she can do at home.        Sleep Management: Poor sleep hygiene and insomnia despite using melatonin.  Patient was given information on yoga nidra to help her fall asleep at night.        Stress management: N/A          Weight gain causing medications: Patient is on paroxetine and pregabalin but both of them do not appear to have caused any weight gain for her.        Anti Obesity Medications: Multiple antiobesity medication reviewed for the patient including metformin, topiramate, phentermine with better blood pressure control, bupropion with better blood pressure control and GLP-1 agonist.        Anatomic considerations: Patient had poor response to sleeve gastrectomy with the weight regain.  She may be a good candidate to undergo a duodenal switch procedure.  I have explained and she would like to be referred to [facility].  I have sent a referral to Dr. Darius Bump.    Plan on 07/29/2021   Asked to eat more fruit and vegetables and asked to add protein 3 times a day.  Add metformin to her present regimen     Visit time  I personally spent 32 minutes face-to-face and non-face-to-face in the care of this patient, which includes all pre, intra, and post visit time on the date of service.  Billing for this visit was completed using Medical Decision Making criteria.

## 2021-07-30 MED ORDER — PAROXETINE 30 MG TABLET
ORAL_TABLET | Freq: Every evening | ORAL | 2 refills | 30 days | Status: CP
Start: 2021-07-30 — End: 2021-08-29

## 2021-07-31 NOTE — Unmapped (Signed)
Left the pt a vm and advised that the paperwork for personal services has been completed, faxed, and that she may need to schedule an appointment if the services does not get approved.

## 2021-08-13 MED ORDER — METFORMIN ER 500 MG TABLET,EXTENDED RELEASE 24 HR
ORAL_TABLET | 1 refills | 0.00000 days
Start: 2021-08-13 — End: ?

## 2021-08-19 NOTE — Unmapped (Signed)
Pre called and triaged.

## 2021-08-20 NOTE — Unmapped (Unsigned)
[facility] CONSULTATION VISIT    REFERRING PROVIDER: Katrina Stack, MD  [address]  [address]  [facility] at [facility],  [place] [address]    PRIMARY CARE PROVIDER: Ricarda Frame, MD    PATIENT PROFILE: Victoria Kelly is a 50 y.o. female (DOB: 1971-04-26) who is seen in consultation at the request of Dr. Curlene Dolphin for evaluation of nausea and vomiting.       ASSESSMENT AND PLAN:     50 y.o. female with     1.     Patient was seen and discussed with Dr. Marland Kitchen who is in agreement with above assessment and plan.    Hetty Ely, MD  Gastroenterology and Hepatology Fellow, PGY-4  [facility]        SUBJECTIVE:     CHIEF COMPLAINT: nausea/vomiting     HISTORY OF PRESENT ILLNESS:      Victoria Kelly is a 50 y.o. female (DOB: 1971/05/11) with MS, obesity s/p vertical sleeve gastrectomy by Dr.Overby on 02/2014 here for nausea.    She underwent bariatric surgery in 2015 without sufficient weight loss (lost 60lb and regained 30lb). She was evaluated by bariatric surgeon Dr.Sharp who noted she would need 37m of weight management classes which she cannot afford at this time. She continues to have persistent nausea and vomiting. 10/2020 EGD was normal.     She has been seen in weight management clinic 07/29/2021. Thought to have very disrupted eating pattern high in processed foods. Weight loss also limited by poor ability to exercise. She was referred to Darius Bump at [place] for duodenal switch procedure.     Today,     REVIEW OF SYSTEMS:   The balance of 12 systems reviewed is negative except as noted in the HPI.     PAST MEDICAL HISTORY:  Past Medical History:   Diagnosis Date   ??? Acid reflux    ??? Allergic rhinitis, cause unspecified 04/11/2013   ??? Depression    ??? Dysthymic disorder 02/10/2012   ??? Generalized anxiety disorder 02/10/2012   ??? H/O bariatric surgery    ??? High blood cholesterol level 05/31/2013   ??? Hx of trichomonal vaginitis 02/2006    On Pap smear ??? Insomnia 05/23/2013    Getting ambien through psych     ??? Iron deficiency anemia 05/24/2013   ??? MS (multiple sclerosis) (CMS-HCC) 11/23/2001    last flare up was 12/2019   ??? Obesity, Class III, BMI 40-49.9 (morbid obesity) (CMS-HCC)    ??? Obstructive sleep apnea 11/01/2011    uses cpap      Patient Active Problem List    Diagnosis Date Noted   ??? Rash 03/06/2018   ??? S/P TAH (total abdominal hysterectomy) 01/16/2018   ??? Morbid (severe) obesity due to excess calories (CMS-HCC)    ??? Elevated serum creatinine 07/14/2017   ??? Hyperlipidemia 08/25/2015   ??? History of bariatric surgery 08/25/2015   ??? H/O cataract removal with insertion of prosthetic lens 12/10/2013   ??? Acne 06/12/2013   ??? Iron deficiency anemia 05/24/2013   ??? Insomnia 05/23/2013   ??? Low back pain 05/23/2013   ??? Weakness of wrist 04/11/2013   ??? Ankle arthritis 04/11/2013   ??? Allergic rhinitis 04/11/2013   ??? Multiple sclerosis (CMS-HCC) 01/21/2013   ??? Dysthymic disorder 02/10/2012   ??? Obesity 11/26/2011   ??? Obstructive sleep apnea 11/01/2011       PAST SURGICAL HISTORY:  Past Surgical  History:   Procedure Laterality Date   ??? CATARACT EXTRACTION Bilateral 08/14/2003, 09/09/2003   ??? CESAREAN SECTION  01/12/2000   ??? COSMETIC SURGERY     ??? DEBRIDEMENT LEG Left 09/30/1999    Spider bite   ??? ENDOMETRIAL ABLATION  02/11/2005   ??? PR LAP, GAST RESTRICT PROC, LONGITUDINAL GASTRECTOMY Midline 02/15/2014    Procedure: ROBOTIC PR LAP, GAST RESTRICT PROC, LONGITUDINAL GASTRECTOMY;  Surgeon: Aida Raider, MD;  Location: MAIN OR [facility];  Service: Gastrointestinal   ??? PR UPPER GI ENDOSCOPY,DIAGNOSIS N/A 11/12/2020    Procedure: ESOPHAGOGASTRODUODENOSCOPY;  Surgeon: San Morelle, MD;  Location: ENDO PROCEDURES [place];  Service: Gastroenterology   ??? TONSILLECTOMY  1982   ??? TOTAL ABDOMINAL HYSTERECTOMY     ??? TUBAL LIGATION  01/12/2000       MEDICATIONS:  Current Outpatient Medications   Medication Sig Dispense Refill   ??? acetaminophen (TYLENOL) 500 MG tablet Take 2 tablets (1,000 mg total) by mouth every eight (8) hours as needed for pain. 30 tablet 0   ??? ergocalciferol-1,250 mcg, 50,000 unit, (DRISDOL) 1,250 mcg (50,000 unit) capsule Take 1 capsule (1,250 mcg total) by mouth once a week. On Thursdays 4 capsule 11   ??? ferrous sulfate 325 (65 FE) MG tablet Take 1 tablet (325 mg total) by mouth daily. 90 tablet 3   ??? fluticasone (FLONASE) 50 mcg/actuation nasal spray 2 sprays by Each Nare route daily. 16 g 6   ??? melatonin 5 mg tablet Take 5 mg by mouth nightly as needed.     ??? metFORMIN (GLUCOPHAGE-XR) 500 MG 24 hr tablet Take 2 tablets (1,000 mg total) by mouth daily with evening meal. Start with 1 tab daily, increase to 2 tab after 2 weeks. 60 tablet 3   ??? multivitamin (TAB-A-VITE/THERAGRAN) per tablet Take 1 tablet by mouth daily. 90 tablet 3   ??? oxybutynin (DITROPAN-XL) 5 MG 24 hr tablet TAKE 1 TABLET BY MOUTH EVERY MORNING 90 tablet 1   ??? pantoprazole (PROTONIX) 40 MG tablet Take 1 tablet (40 mg total) by mouth daily. 90 tablet 3   ??? PARoxetine (PAXIL) 30 MG tablet Take 2 tablets (60 mg total) by mouth every evening. 60 tablet 2   ??? pregabalin (LYRICA) 75 MG capsule TAKE TWO (2) CAPSULES BY MOUTH TWICE DAILY 120 capsule 4   ??? teriflunomide (AUBAGIO) 14 mg Tab Take 1 tablet (14 mg total) by mouth every evening. 30 tablet 5   ??? traZODone (DESYREL) 50 MG tablet Take 1 tablet (50 mg total) by mouth nightly. 90 tablet 3     No current facility-administered medications for this visit.       ALLERGIES:  Cymbalta [duloxetine], Keflex [cephalexin], and Kelp    FAMILY HISTORY:  family history includes Brain cancer in her father; Breast cancer in her cousin; Cataracts in her mother; Cervical cancer in her cousin; Diabetes in her mother; Fibromyalgia in her mother; Heart disease in her mother; Hyperlipidemia in her father; Hypertension in her mother; Leukemia in her maternal aunt; Lupus in her maternal aunt, maternal aunt, and mother.    SOCIAL HISTORY:  Social History     Tobacco Use   ??? Smoking status: Never Smoker   ??? Smokeless tobacco: Never Used   Vaping Use   ??? Vaping Use: Never used   Substance Use Topics   ??? Alcohol use: No   ??? Drug use: No       OBJECTIVE:   VITAL SIGNS: There were no vitals taken for  this visit.  Wt Readings from Last 6 Encounters:   07/29/21 (!) 139.7 kg (308 lb)   04/30/21 (!) 140.6 kg (310 lb)   11/12/20 (!) 143.4 kg (316 lb 2.2 oz)   10/23/20 (!) 142.8 kg (314 lb 12.8 oz)   09/25/20 (!) 145 kg (319 lb 9.6 oz)   07/14/20 (!) 140 kg (308 lb 9.6 oz)       PHYSICAL EXAM:  Constitutional: No acute distress, well nourished and well hydrated.   HEENT: PERRL, conjunctiva clear, anicteric, oropharynx clear, neck supple, no LAD.   CV: Regular rate and rhythm, normal S1, S2. No murmurs. No JVD.  Lung: Clear to auscultation bilaterally. Unlabored breathing.   Abdomen: soft, normal bowel sounds, non-distended, non-tender. No organomegaly. No ascites.   Extremities: No edema, well perfused  MSK: No joint swelling or tenderness noted, no deformities  Skin: No rashes, jaundice or skin lesions noted  Neuro: Alert, Oriented x 3. No focal deficits. No asterixis.   Mental Status: Thought organized, appropriate affect, pleasantly interactive, not anxious appearing      DIAGNOSTIC STUDIES:  I have reviewed all pertinent diagnostic studies, including:    GI Procedures:  11/12/20 EGD   _______________________________________________________________________________  Impression:            - Normal esophagus.                         - A sleeve gastrectomy was found. Dilated antrum.                         - Normal examined duodenum.                         - No specimens collected.    Radiographic studies:  ***    Laboratory results:  Lab Results   Component Value Date    WBC 5.4 04/30/2021    HGB 12.6 04/30/2021    HCT 37.7 04/30/2021    PLT 263 04/30/2021     Lab Results   Component Value Date    NA 140 04/30/2021    K 4.2 04/30/2021    CL 107 04/30/2021    CO2 30.0 04/30/2021    BUN 11 04/30/2021 CREATININE 1.17 (H) 04/30/2021    CALCIUM 9.5 04/30/2021     Lab Results   Component Value Date    ALKPHOS 86 04/30/2021    BILITOT 0.4 04/30/2021    BILIDIR 0.30 07/24/2019    PROT 7.2 04/30/2021    ALBUMIN 3.4 04/30/2021    ALT 12 04/30/2021    AST 15 04/30/2021     No results found for: PT, INR, APTT

## 2021-08-28 DIAGNOSIS — F32A Depression, unspecified depression type: Principal | ICD-10-CM

## 2021-08-28 MED ORDER — PAROXETINE 30 MG TABLET
ORAL_TABLET | Freq: Every evening | ORAL | 1 refills | 30.00000 days | Status: CP
Start: 2021-08-28 — End: 2021-09-27

## 2021-08-28 NOTE — Unmapped (Signed)
Patient Requests Medication Refill    Name of medication: PARoxetine (PAXIL) 30 MG tablet     ??? Is there a preferred amount requested (e.g. 30 pills, 3 months worth - if no leave blank):   ??? Desired pharmacy (defaults to patient's preferred pharmacy list - confirm or change):    [facility] [address] [place], [place] - [address]  [address] [place] [address]  Phone: [phone] Fax: [phone]    ??? Best callback number if any questions (defaults to patient's preferred phone - confirm or change): [phone]  ??? PCP: Ricarda Frame, MD  ??? Last encounter in department: 04/30/2021 (If more than a year, offer an appointment.)    Patient been without medication for 2 weeks

## 2021-09-03 ENCOUNTER — Ambulatory Visit: Admit: 2021-09-03 | Discharge: 2021-09-04 | Payer: MEDICARE

## 2021-09-03 DIAGNOSIS — F341 Dysthymic disorder: Principal | ICD-10-CM

## 2021-09-03 DIAGNOSIS — G35 Multiple sclerosis: Principal | ICD-10-CM

## 2021-09-03 DIAGNOSIS — Z1211 Encounter for screening for malignant neoplasm of colon: Principal | ICD-10-CM

## 2021-09-03 DIAGNOSIS — E669 Obesity, unspecified: Principal | ICD-10-CM

## 2021-09-03 DIAGNOSIS — F32A Depression, unspecified depression type: Principal | ICD-10-CM

## 2021-09-03 DIAGNOSIS — J329 Chronic sinusitis, unspecified: Principal | ICD-10-CM

## 2021-09-03 DIAGNOSIS — Z1159 Encounter for screening for other viral diseases: Principal | ICD-10-CM

## 2021-09-03 LAB — LIPID PANEL
CHOLESTEROL/HDL RATIO SCREEN: 5.1 — ABNORMAL HIGH (ref 1.0–4.5)
CHOLESTEROL: 180 mg/dL (ref <=200)
HDL CHOLESTEROL: 35 mg/dL — ABNORMAL LOW (ref 40–60)
LDL CHOLESTEROL CALCULATED: 115 mg/dL — ABNORMAL HIGH (ref 40–99)
NON-HDL CHOLESTEROL: 145 mg/dL — ABNORMAL HIGH (ref 70–130)
TRIGLYCERIDES: 148 mg/dL (ref 0–150)
VLDL CHOLESTEROL CAL: 29.6 mg/dL (ref 11–40)

## 2021-09-03 MED ORDER — PAROXETINE 30 MG TABLET
ORAL_TABLET | Freq: Every evening | ORAL | 1 refills | 30 days | Status: CP
Start: 2021-09-03 — End: 2021-10-03

## 2021-09-03 MED ORDER — FLUTICASONE PROPIONATE 50 MCG/ACTUATION NASAL SPRAY,SUSPENSION
Freq: Every day | NASAL | 6 refills | 61.00000 days | Status: CP
Start: 2021-09-03 — End: ?

## 2021-09-03 NOTE — Unmapped (Signed)
[facility] at [facility]     Reason for visit: FOLLOW UP    Questions / Concerns that need to be addressed: Cyst on back of head Patient needs Paxil  [facility]         Diabetes:  ??? Regularly checking blood sugars?: no  o If yes, when? Complete log for past 7 days  Date Before Breakfast After Breakfast Before Lunch After Lunch Before Dinner After Dinner Before Bed                                                                                                                                     Hypertension:  ??? Have blood pressure cuff at home?: no  ??? Regularly checking blood pressure?: no  ??? If patient has a  record of recent blood pressures, please enter into flowsheets using this link PTHomeBP          [facility]ron BPs (complete if screening BP has a systolic  > 129 or diastolic > 79)  BP#1 102/72 p 78           Allergies reviewed: Yes    Medication reviewed: Yes  Pended refills? Yes        HCDM reviewed and updated in Epic:    We are working to make sure all of our patients??? wishes are updated in Epic and part of that is documenting a Environmental health practitioner for each patient  A Health Care Decision Maker is someone you choose who can make health care decisions for you if you are not able - who would you most want to do this for you????  is already up to date.        BPAs completed:  Influenza vaccination      COVID-19 Vaccine Summary  Which COVID-19 Vaccine was administered  Pfizer  Type:  Dates Given:                   If no: Are you interested in scheduling? Wants vaccine - eligible now    Immunization History   Administered Date(s) Administered   ??? COVID-19 VACC,MRNA,(PFIZER)(PF) 01/16/2020, 02/06/2020, 08/26/2020   ??? INFLUENZA TIV (TRI) PF (IM) 08/17/2007, 08/20/2008, 11/20/2009, 07/09/2011   ??? Influenza Vaccine Quad (IIV4 PF) 6mo+ injectable 08/25/2015, 11/12/2016, 07/08/2017, 07/14/2020   ??? Influenza Vaccine Quad (IIV4 W/PRESERV) 83MO+ 07/24/2013 ??? Influenza Virus Vaccine, unspecified formulation 08/25/2015, 11/12/2016, 07/08/2017   ??? PNEUMOCOCCAL POLYSACCHARIDE 23 02/10/2012   ??? TdaP 03/14/2012, 08/25/2015       __________________________________________________________________________________________    SCREENINGS COMPLETED IN FLOWSHEETS    HARK Screening       AUDIT       PHQ2       PHQ9          P4 Suicidality Screener  [name]7       COPD Assessment       Falls Risk       .imcres

## 2021-09-03 NOTE — Unmapped (Signed)
[facility]  [address]  [place], [address]  Phone: [phone]  Toll Free: [phone]  Fax: [phone]    Thank you for choosing [facility] Clinic for your care.    Important Numbers    Main Clinic: [phone] or toll free [phone]    After Hours, Weekend, or Holidays:  Call the [facility] 24/7 Nursing Line [phone] to get nurse advice.  Go to [facility] Urgent Care walk-in clinic at [address], [place], [place]; [phone]; 7 days a week from 9:00AM - 8:00PM.  Go to [facility] Urgent Care walk-in clinic at [address], [place], [place] [address]; [phone]; 7 days a week from 8:00PM - 8:00PM  Go to [facility] Urgent Care at [facility] at [address] [place], [place]; [phone]; Mon-Fri 8:00AM-7:00PM, Sat-Sun 12:00PM-5:00PM  Go to [facility] Urgent Care for sprains and strains, joint pain, sports injuries and possible fractures at [address], [address], [place], [place]; [phone]; Mon-Thurs 8:00AM-7:00PM, Fri 8:00AM-5:00PM    [facility] Internal Medicine at [employer]: [phone]  6th Floor/Internal Medicine Clinic: Ivor Costa [phone]  Ardean Larsen (bilingual): [phone]    [facility]([place] PAP): [phone], choose option 2  [facility]: [phone] *Pharmacy can mail medications to your home. You must call to request the medication be mailed.[facility]: [phone]  [facility]: [phone]    [facility] Care Management: Are you having trouble with you health because of cost, your mood, trouble getting to clinic, or where you live. Our Care Management team can help!  Available to assist with requests sent via mychart or by calling the main clinic number at [phone]  Monday-Friday 8:00AM - 5:00PM    [facility]  I'm feeling unsafe, have experienced physical abuse, threats, emotional abuse, sexual abuse or other violence. Who do I call for confidential advice and assistance?  Call [phone] Monday through Fridays 9:00am-4:30pm. Call [phone] after hours.    Same Day Clinic   I'm sick today and need an appointment during office hours. Who do I call?  Call [phone], ask for an appointment in the Same Day Clinic  Same Day Clinic is located on the 5th floor at [address] [place] [address]    How do I request medication refills?  Request a refill via [facility] (patient portal), call clinic at [phone] or have your pharmacy fax the request to [phone].    We highly encourage those with internet access to sign up for My [facility] Chart, our new patient portal service.  This service is free to all [facility] patients and offers the following benefits:  Secure messaging with your care team  Request appointments/cancel appointments  Access test results  Request prescription refills  Pay bills online  Manage the health of loved ones  Track your health    Sign up for your My [facility] Chart account at [email or web address].  Free Android and iOs smartphone and tablet applications are also available for your convenience.    How to reset you [facility] Chart  If you forget your My [facility] Chart username or password, select ???forgot username??? or ???forgot password??? located under the ???sign in??? button on the login page. If you do not remember the information required to reset your password, you can contact [facility] at [phone] to have your My [facility] Chart password reset.    Scheduling appointments Online  The [facility] MyChart secure website and app makes it easy for you to schedule appointments. You can schedule most primary care clinic appointments online with providers you have seen before. Log in to [email or web address] and select Visits to schedule an appointment today

## 2021-09-03 NOTE — Unmapped (Unsigned)
Internal Medicine Clinic Visit    Reason for visit: Follow-up    A/P:           1. Multiple sclerosis (CMS-HCC)    2. Obesity, unspecified classification, unspecified obesity type, unspecified whether serious comorbidity present    3. Dysthymic disorder    4. Depression, unspecified depression type    5. Sinusitis, unspecified chronicity, unspecified location    6. Need for hepatitis C screening test    7. Colon cancer screening          1. Multiple sclerosis: Follows with [facility] neurology. Reports that symptoms are stable. MRI from 05/26/21 with no changes from 08/21/2019.   -cont Aubagio per neuro (was started on 03/2017).  -8/9/22had yearly MRI of the  Brain, Cervical  and Thoracic spine with small focus of T2   hyperintensity in the right lateral cord T3    2. Obesity s/p bariatric surgery 2015: Continues to experience nausea and vomiting on a regular basis. Endoscopy performed January 2022 with normal findings. Patient with sub-optimal wieght loss from her surgery in 2015 (lost 60lbs initially and gained back 40lbs). Patient was recently seen by Dr. Conception Chancy for further weight loss management. Per recommendations, she may be a good candidate to undergo a duodenal switch procedure and has been referred to Dr. Darius Bump at [place].  - Check Lipid panel today  - Colonoscopy due (ordered for same day as repeat upper endoscopy)  - encourage to continue with dietary changes of increased fruit and vegetables and lean protein 3 times a day.  - Continue metformin 1000mg  daily (follow-up with endocrine)    3. Depression and Insomnia: worsened in current living situation. She is currently living with her mom in [place], but will soon move to join her fiance in [place] in December. She is looking forward to the move. Needs a new prescription for Paxil. Patient feels safe where she is currently living, she denies SI/HI.  - continue Paxil and trazodone    4. HM:   - 2nd COVID booster and influenza vaccine given today in clinic  - Needs Shingles vaccine  - Colonoscopy ordered  -lipid panel ordered    Staffed with Dr. Madelon Lips, seen and discussed      Return with Bernita Raisin, for In-person.      __________________________________________________________    HPI:     Victoria Kelly is a 50 y.o. female who presents for follow-up on multiple chronic health concerns including Multiple Sclerosis, depression, and obesity s/p gastric sleeve.    She notes that overall she is doing well. She is encouraged that she will be able to undergo further weightloss procedures. Previously she was told that she would have to attend weight loss classes, but this is no longer an issue. She has started taking metformin and notes some GI distress, but has not had weight loss with metformin. She thinks that increased dosage may help. She also notes that she has run out of paxil and is in need of a refill. Upon presentation today she does not note nausea, abdominal pain, headache, dizziness, shortness of breath, or chest pain.   __________________________________________________________        Medications:  Reviewed in EPIC  __________________________________________________________    Physical Exam:   Vital Signs:  Vitals:    09/03/21 1444   BP: 102/72   Pulse: 78   Temp: 36.8 ??C (98.2 ??F)   TempSrc: Oral   SpO2: 98%   Weight: (!) 142.2 kg (313 lb  6.4 oz)   Height: 170.2 cm (5' 7)          PTHomeBP        Gen: Well appearing, obese, NAD  CV: RRR, no murmurs  Pulm: CTA bilaterally, no crackles or wheezes  Abd: Soft, NTND, normal BS.   Ext: No edema      PHQ-9 Score:     [name]-7 Score:       Medication adherence and barriers to the treatment plan have been addressed. Opportunities to optimize healthy behaviors have been discussed. Patient / caregiver voiced understanding. vomiting. Upon presentation today she does not note nausea, abdominal pain, headache, dizziness, shortness of breath, or chest pain.     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 ??Was evaluated in ER, treated with course of IV steroids at home. ??Does not remember details.  Hosptilaized??03/23-03/26/2021??for??sensory symptoms.??Balance??was??off and she was having pain??in??her??feet.??Received three days of IV methylprednisolone   ??  MS MEDICATION:  Copaxone 2011.  Aubagio 03/2017.  __________________________________________________________        Medications:  Reviewed in EPIC  __________________________________________________________    Physical Exam:   Vital Signs:  There were no vitals filed for this visit.       PTHomeBP        Gen: Well appearing, NAD  CV: RRR, no murmurs  Pulm: CTA bilaterally, no crackles or wheezes  Abd: Soft, NTND, normal BS.   Ext: No edema  ***    PHQ-9 Score:     [name]-7 Score:       Medication adherence and barriers to the treatment plan have been addressed. Opportunities to optimize healthy behaviors have been discussed. Patient / caregiver voiced understanding.

## 2021-09-06 DIAGNOSIS — G35 Multiple sclerosis: Principal | ICD-10-CM

## 2021-09-06 DIAGNOSIS — N3281 Overactive bladder: Principal | ICD-10-CM

## 2021-09-06 MED ORDER — OXYBUTYNIN CHLORIDE ER 5 MG TABLET,EXTENDED RELEASE 24 HR
ORAL_TABLET | 10 refills | 0 days
Start: 2021-09-06 — End: ?

## 2021-09-07 MED ORDER — OXYBUTYNIN CHLORIDE ER 5 MG TABLET,EXTENDED RELEASE 24 HR
ORAL_TABLET | 1 refills | 0 days | Status: CP
Start: 2021-09-07 — End: ?

## 2021-09-07 NOTE — Unmapped (Signed)
Completed.

## 2021-09-09 NOTE — Unmapped (Unsigned)
[facility]  [address]  [place], [place] [address]    Clinical Pharmacist Visit Summary    Assessment and Plan:   1. Weight Management - {Blank signle:19197::Overweight,Obese,Morbidly Obese}: There is no height or weight on file to calculate BMI. Marland Kitchen Her target weight is *** . Co-morbidities at this time include acid reflux, allergic rhinitis, depression, anxiety, bariatric surgery, high cholesterol, anemia, obesity, insomnia  ??? Weight today has *** since last visit  ??? Reports good tolerability and adherence to current regimen of ***  ??? Denies ***  ??? Discussed the importance of medication adherence, lifestyle modifications, and consistency to meet weight management goals  ??? ***  ??? ***  ??? Continue wit lifestyle modifications  ??? Follow up with clinical pharmacist ***  ??? Follow up with Endocrinology provider for interim weight management ***    I spent a total of {NUMBERS; 0-45 BY 5:10291} minutes {VISIT UEAV:40981} with the patient delivering clinical care and providing education/counseling.    Saddie Benders?? PharmD, BCACP, CPP, CDCES  Clinical Pharmacist Practitioner - Endocrinology    Subjective:   Reason for visit: Review medication options for weight management and adjust weight loss regimen as needed.     Primary Weight Management Provider and Last Visit: Dr. Karmen Bongo MD on 07/29/21    At Last Visit:   ??? Add metformin  ??? Add more fruit, vegetables, protein to diet.     Since Last Visit/History of Present Illness:   Victoria Kelly is a 50 y.o. year old female with a past medical history significant for obesity s/p bariatric surgery 2015, acid reflux, allergic rhinitis, depression, anxiety, bariatric surgery, high cholesterol, anemia, obesity, insomnia  who presents today for a weight management-related visit. Primary reason for wanting obesity treatment: self motivation. Overall goal:{Blank single:19197::no weight goal, only to feel better,No weight goal , improvement in ***} .    Current Anti-Obesity Regimen:  ??? Metformin XR 500 mg, 2 tablets by mouth daily    Patient Reports:   ??? Adverse effects: Denies***  ??? Changes in energy: Denies***   ??? Increased satiety:  Denies***  ??? Increased satiation: Denies***    Other Weight-Related Considerations:   ??? Major changes to diet? Denies***  ??? Major changes to physical activity? Denies***  ??? Major changes to sleep? Denies***  ??? Major changes to mental health? Denies***    Previous Anti-Obesity Therapy:   Anti-Obesity Agents Previous Medication   Phentermine No, possible option for patient if better BP control   Semaglutide  No, possible option for patient.   Liraglutide No, possible option for patient.   Bupropion No, possible option for patient if better BP control.    Naltrexone No   Topiramate  No, possible option for patient.   Orlistat No   Metformin Yes, currently   SGLT-2i No   Tirzepatide No, possible option for patient.   Other agents N/A     Concurrent Medications Contributing to Weight Gain: Paroxetine, pregabalin    Weight Management Progress:  ??? Goal weight:   ??? Initial weight: 319.6 lb  ??? Weight at last visit: 308 lb (07/29/21)  ??? Current weight: *** (09/08/21)   ??? Percentage weight loss:  o Since initial visit:  o Since last visit:         Current Outpatient Medications:   ???  acetaminophen (TYLENOL) 500 MG tablet, Take 2 tablets (1,000 mg total) by mouth every eight (8) hours as needed for pain., Disp: 30  tablet, Rfl: 0  ???  ergocalciferol-1,250 mcg, 50,000 unit, (DRISDOL) 1,250 mcg (50,000 unit) capsule, Take 1 capsule (1,250 mcg total) by mouth once a week. On Thursdays, Disp: 4 capsule, Rfl: 11  ???  ferrous sulfate 325 (65 FE) MG tablet, Take 1 tablet (325 mg total) by mouth daily., Disp: 90 tablet, Rfl: 3  ???  fluticasone propionate (FLONASE) 50 mcg/actuation nasal spray, 2 sprays into each nostril daily., Disp: 16 g, Rfl: 6  ???  melatonin 5 mg tablet, Take 5 mg by mouth nightly as needed., Disp: , Rfl: ???  metFORMIN (GLUCOPHAGE-XR) 500 MG 24 hr tablet, Take 2 tablets (1,000 mg total) by mouth daily with evening meal. Start with 1 tab daily, increase to 2 tab after 2 weeks., Disp: 60 tablet, Rfl: 3  ???  multivitamin (TAB-A-VITE/THERAGRAN) per tablet, Take 1 tablet by mouth daily., Disp: 90 tablet, Rfl: 3  ???  oxybutynin (DITROPAN-XL) 5 MG 24 hr tablet, TAKE 1 TABLET BY MOUTH EVERY MORNING, Disp: 90 tablet, Rfl: 1  ???  pantoprazole (PROTONIX) 40 MG tablet, Take 1 tablet (40 mg total) by mouth daily., Disp: 90 tablet, Rfl: 3  ???  PARoxetine (PAXIL) 30 MG tablet, Take 2 tablets (60 mg total) by mouth every evening., Disp: 60 tablet, Rfl: 1  ???  pregabalin (LYRICA) 75 MG capsule, TAKE TWO (2) CAPSULES BY MOUTH TWICE DAILY, Disp: 120 capsule, Rfl: 4  ???  teriflunomide (AUBAGIO) 14 mg Tab, Take 1 tablet (14 mg total) by mouth every evening., Disp: 30 tablet, Rfl: 5  ???  traZODone (DESYREL) 50 MG tablet, Take 1 tablet (50 mg total) by mouth nightly., Disp: 90 tablet, Rfl: 3    Past Medical History:   Diagnosis Date   ??? Acid reflux    ??? Allergic rhinitis, cause unspecified 04/11/2013   ??? Depression    ??? Dysthymic disorder 02/10/2012   ??? Generalized anxiety disorder 02/10/2012   ??? H/O bariatric surgery    ??? High blood cholesterol level 05/31/2013   ??? Hx of trichomonal vaginitis 02/2006    On Pap smear   ??? Insomnia 05/23/2013    Getting ambien through psych     ??? Iron deficiency anemia 05/24/2013   ??? MS (multiple sclerosis) (CMS-HCC) 11/23/2001    last flare up was 12/2019   ??? Obesity, Class III, BMI 40-49.9 (morbid obesity) (CMS-HCC)    ??? Obstructive sleep apnea 11/01/2011    uses cpap       Objective:     Vitals:  There were no vitals filed for this visit.    There is no height or weight on file to calculate BMI.    Wt Readings from Last 6 Encounters:   09/03/21 (!) 142.2 kg (313 lb 6.4 oz)   07/29/21 (!) 139.7 kg (308 lb)   04/30/21 (!) 140.6 kg (310 lb)   11/12/20 (!) 143.4 kg (316 lb 2.2 oz)   10/23/20 (!) 142.8 kg (314 lb 12.8 oz) 09/25/20 (!) 145 kg (319 lb 9.6 oz)     Lab Results   Component Value Date    NA 140 04/30/2021    K 4.2 04/30/2021    CL 107 04/30/2021    CO2 30.0 04/30/2021    BUN 11 04/30/2021    CREATININE 1.17 (H) 04/30/2021    GFR >= 60 02/10/2012    GLU 87 04/30/2021    CALCIUM 9.5 04/30/2021    ALBUMIN 3.4 04/30/2021     Lab Results   Component Value Date  ALKPHOS 86 04/30/2021    BILITOT 0.4 04/30/2021    BILIDIR 0.30 07/24/2019    PROT 7.2 04/30/2021    ALBUMIN 3.4 04/30/2021    ALT 12 04/30/2021    AST 15 04/30/2021     Lab Results   Component Value Date    A1C 4.6 (L) 07/30/2019     Lab Results   Component Value Date    CHOL 180 09/03/2021    CHOL 198 08/25/2015    CHOL 266 (H) 05/24/2013     Lab Results   Component Value Date    HDL 35 (L) 09/03/2021    HDL 50 08/25/2015    HDL 58 05/24/2013     Lab Results   Component Value Date    LDL 115 (H) 09/03/2021    LDL 135 (H) 08/25/2015    LDL 148 02/10/2012     Lab Results   Component Value Date    VLDL 29.6 09/03/2021    VLDL 13.2 08/25/2015     Lab Results   Component Value Date    CHOLHDLRATIO 5.1 (H) 09/03/2021    CHOLHDLRATIO 4.0 08/25/2015     Lab Results   Component Value Date    TRIG 148 09/03/2021    TRIG 66 08/25/2015    TRIG 91 02/10/2012     Lab Results   Component Value Date    TSH 1.64 05/24/2013

## 2021-09-12 NOTE — Unmapped (Signed)
I saw and evaluated the patient, participating in the key portions of the service.  I reviewed the resident???s note.  I agree with the resident???s findings and plan. Javana Schey A Izreal Kock, MD

## 2021-09-21 ENCOUNTER — Ambulatory Visit: Admit: 2021-09-21 | Discharge: 2021-09-22 | Discharge status: Against Medical Advice | Payer: MEDICARE

## 2021-09-21 LAB — COMPREHENSIVE METABOLIC PANEL
ALBUMIN: 3 g/dL — ABNORMAL LOW (ref 3.4–5.0)
ALKALINE PHOSPHATASE: 65 U/L (ref 46–116)
ALT (SGPT): 10 U/L (ref 10–49)
ANION GAP: 7 mmol/L (ref 5–14)
AST (SGOT): 14 U/L (ref <=34)
BILIRUBIN TOTAL: 0.4 mg/dL (ref 0.3–1.2)
BLOOD UREA NITROGEN: 8 mg/dL — ABNORMAL LOW (ref 9–23)
BUN / CREAT RATIO: 7
CALCIUM: 8.6 mg/dL — ABNORMAL LOW (ref 8.7–10.4)
CHLORIDE: 107 mmol/L (ref 98–107)
CO2: 23.7 mmol/L (ref 20.0–31.0)
CREATININE: 1.11 mg/dL — ABNORMAL HIGH
EGFR CKD-EPI (2021) FEMALE: 60 mL/min/{1.73_m2} (ref >=60)
GLUCOSE RANDOM: 113 mg/dL (ref 70–179)
POTASSIUM: 3.9 mmol/L (ref 3.4–4.8)
PROTEIN TOTAL: 6 g/dL (ref 5.7–8.2)
SODIUM: 138 mmol/L (ref 135–145)

## 2021-09-21 LAB — CBC W/ AUTO DIFF
BASOPHILS ABSOLUTE COUNT: 0 10*9/L (ref 0.0–0.1)
BASOPHILS RELATIVE PERCENT: 0.6 %
EOSINOPHILS ABSOLUTE COUNT: 0 10*9/L (ref 0.0–0.5)
EOSINOPHILS RELATIVE PERCENT: 0.2 %
HEMATOCRIT: 38.4 % (ref 34.0–44.0)
HEMOGLOBIN: 12.8 g/dL (ref 11.3–14.9)
LYMPHOCYTES ABSOLUTE COUNT: 0.5 10*9/L — ABNORMAL LOW (ref 1.1–3.6)
LYMPHOCYTES RELATIVE PERCENT: 9.5 %
MEAN CORPUSCULAR HEMOGLOBIN CONC: 33.4 g/dL (ref 32.0–36.0)
MEAN CORPUSCULAR HEMOGLOBIN: 27.6 pg (ref 25.9–32.4)
MEAN CORPUSCULAR VOLUME: 82.8 fL (ref 77.6–95.7)
MEAN PLATELET VOLUME: 7.4 fL (ref 6.8–10.7)
MONOCYTES ABSOLUTE COUNT: 0.3 10*9/L (ref 0.3–0.8)
MONOCYTES RELATIVE PERCENT: 5.2 %
NEUTROPHILS ABSOLUTE COUNT: 4.7 10*9/L (ref 1.8–7.8)
NEUTROPHILS RELATIVE PERCENT: 84.5 %
NUCLEATED RED BLOOD CELLS: 0 /100{WBCs} (ref <=4)
PLATELET COUNT: 196 10*9/L (ref 150–450)
RED BLOOD CELL COUNT: 4.64 10*12/L (ref 3.95–5.13)
RED CELL DISTRIBUTION WIDTH: 14.9 % (ref 12.2–15.2)
WBC ADJUSTED: 5.5 10*9/L (ref 3.6–11.2)

## 2021-09-21 LAB — LIPASE: LIPASE: 24 U/L (ref 12–53)

## 2021-09-21 MED ADMIN — ondansetron (ZOFRAN-ODT) disintegrating tablet 4 mg: 4 mg | ORAL | @ 19:00:00 | Stop: 2021-09-21

## 2021-09-21 MED ADMIN — acetaminophen (TYLENOL) tablet 1,000 mg: 1000 mg | ORAL | @ 19:00:00 | Stop: 2021-09-21

## 2021-09-21 NOTE — Unmapped (Signed)
Pt reports having a headache that began yesterday. Also reports having episodes of emesis and diarrhea.

## 2021-09-21 NOTE — Unmapped (Signed)
[facility]  Emergency Department Medical Screening Examination     Subjective     Victoria Kelly is a 50 y.o. female presenting for evaluation of Headache New Onset or New Symptoms.  Patient presents with 1 day of headache, nausea, vomiting, and diarrhea.  No associated fever, congestion, cough, or abdominal pain.  Has not used any interventions.     Abbreviated Review of Systems/Covid Screen  Constitutional: Negative for fever  Respiratory: Negative for cough. Negative for difficulty breathing.    Objective     ED Triage Vitals [09/21/21 1348]   Enc Vitals Group      BP 125/74      Heart Rate 96      SpO2 Pulse       Resp 18      Temp 37.8 ??C (100 ??F)      Temp Source Oral      SpO2 97 %        Focused Physical Exam  Constitutional: No acute distress.  Respiratory: Non-labored respirations.  Neurological: Clear speech. No gross focal neurologic deficits are appreciated.  ABD: Nontender  ?  Assessment & Plan     1 day of headache, nausea, vomiting, and diarrhea.  No abdominal pain, fever, respiratory symptoms.  Has not used any interventions.  We will send off CMP, lipase, and CBC from triage.  We will treat symptoms with acetaminophen and Zofran.  Patient will be roomed once 1 is available    A medical screening exam has been performed. At the time of this evaluation, no emergency medical condition requiring immediate stabilization has been identified nor is there suspicion for imminent decompensation. Appropriate triage protocols will be implemented and a comprehensive ED evaluation with disposition will be completed by a healthcare provider when an appropriate ED location becomes available. The patient is aware that this is an initial encounter only and verbalizes understanding and agreement with the plan.     Emergency Department operations continue to be impacted by the COVID-19 pandemic.     Ansel Bong, FNP  September 21, 2021 1:50 PM

## 2021-09-22 DIAGNOSIS — F32A Depression, unspecified depression type: Principal | ICD-10-CM

## 2021-09-22 MED ORDER — PAROXETINE 30 MG TABLET
ORAL_TABLET | Freq: Every evening | ORAL | 1 refills | 30 days
Start: 2021-09-22 — End: 2021-10-22

## 2021-09-24 MED ORDER — PAROXETINE 30 MG TABLET
ORAL_TABLET | Freq: Every evening | ORAL | 1 refills | 30 days | Status: CP
Start: 2021-09-24 — End: 2021-10-24

## 2021-09-24 NOTE — Unmapped (Signed)
Attempted to contact ([phone]) pt regarding LWBS visit. Unidentifiable voicemail answered, no message left at this time.

## 2021-10-08 DIAGNOSIS — K219 Gastro-esophageal reflux disease without esophagitis: Principal | ICD-10-CM

## 2021-10-08 MED ORDER — PANTOPRAZOLE 40 MG TABLET,DELAYED RELEASE
ORAL_TABLET | Freq: Every day | ORAL | 3 refills | 90 days | Status: CP
Start: 2021-10-08 — End: 2022-09-03

## 2021-10-08 NOTE — Unmapped (Signed)
Pharmacy request refill. 

## 2021-11-04 MED ORDER — PREGABALIN 75 MG CAPSULE
ORAL_CAPSULE | 4 refills | 0 days
Start: 2021-11-04 — End: ?

## 2021-11-06 MED ORDER — PREGABALIN 75 MG CAPSULE
ORAL_CAPSULE | 4 refills | 0 days | Status: CP
Start: 2021-11-06 — End: ?

## 2021-11-11 ENCOUNTER — Ambulatory Visit: Payer: Medicaid Other | Admitting: Podiatry

## 2021-11-13 NOTE — Unmapped (Unsigned)
[facility] CONSULTATION VISIT    REFERRING PROVIDER: Katrina Stack, MD  [address]  [address]  [facility],  [place] [address]    PRIMARY CARE PROVIDER: Ricarda Frame, MD    PATIENT PROFILE: Victoria Kelly is a 51 y.o. female (DOB: July 29, 1971) who is seen in consultation at the request of Dr. Curlene Dolphin for evaluation of nausea and vomiting.       ASSESSMENT AND PLAN:     51 y.o. female with     1.     Patient was seen and discussed with Dr. Marland Kitchen who is in agreement with above assessment and plan.    Hetty Ely, MD  Gastroenterology and Hepatology Fellow, PGY-4  [facility]        SUBJECTIVE:     CHIEF COMPLAINT: ***    HISTORY OF PRESENT ILLNESS:      Victoria Kelly is a 51 y.o. female (DOB: 01-22-1971) with with MS, depression, obesity s/p vertical sleeve gastrectomy by Dr.Overby on 02/2014 here for nausea.  ??  She underwent bariatric surgery in 2015 without sufficient weight loss (lost 60lb and regained 30lb). She was evaluated by bariatric surgeon Dr.Sharp who noted she would need 79m of weight management classes which she cannot afford at this time. She continues to have persistent nausea and vomiting. 10/2020 EGD was normal.   ??  She has been seen in weight management clinic 07/29/2021. Thought to have very disrupted eating pattern high in processed foods. Weight loss also limited by poor ability to exercise. She was referred to Darius Bump at [facility] for duodenal switch procedure.     Today,     Meds?     Routine colonoscopy ordered by pcp     REVIEW OF SYSTEMS:   The balance of 12 systems reviewed is negative except as noted in the HPI.     PAST MEDICAL HISTORY:  Past Medical History:   Diagnosis Date   ??? Acid reflux    ??? Allergic rhinitis, cause unspecified 04/11/2013   ??? Depression    ??? Dysthymic disorder 02/10/2012   ??? Generalized anxiety disorder 02/10/2012   ??? H/O bariatric surgery    ??? High blood cholesterol level 05/31/2013 ??? Hx of trichomonal vaginitis 02/2006    On Pap smear   ??? Insomnia 05/23/2013    Getting ambien through psych     ??? Iron deficiency anemia 05/24/2013   ??? MS (multiple sclerosis) (CMS-HCC) 11/23/2001    last flare up was 12/2019   ??? Obesity, Class III, BMI 40-49.9 (morbid obesity) (CMS-HCC)    ??? Obstructive sleep apnea 11/01/2011    uses cpap      Patient Active Problem List    Diagnosis Date Noted   ??? Rash 03/06/2018   ??? S/P TAH (total abdominal hysterectomy) 01/16/2018   ??? Morbid (severe) obesity due to excess calories (CMS-HCC)    ??? Elevated serum creatinine 07/14/2017   ??? Hyperlipidemia 08/25/2015   ??? History of bariatric surgery 08/25/2015   ??? H/O cataract removal with insertion of prosthetic lens 12/10/2013   ??? Acne 06/12/2013   ??? Iron deficiency anemia 05/24/2013   ??? Insomnia 05/23/2013   ??? Low back pain 05/23/2013   ??? Weakness of wrist 04/11/2013   ??? Ankle arthritis 04/11/2013   ??? Allergic rhinitis 04/11/2013   ??? Multiple sclerosis (CMS-HCC) 01/21/2013   ??? Dysthymic disorder 02/10/2012   ??? Obesity 11/26/2011   ??? Obstructive  sleep apnea 11/01/2011       PAST SURGICAL HISTORY:  Past Surgical History:   Procedure Laterality Date   ??? CATARACT EXTRACTION Bilateral 08/14/2003, 09/09/2003   ??? CESAREAN SECTION  01/12/2000   ??? COSMETIC SURGERY     ??? DEBRIDEMENT LEG Left 09/30/1999    Spider bite   ??? ENDOMETRIAL ABLATION  02/11/2005   ??? PR LAP, GAST RESTRICT PROC, LONGITUDINAL GASTRECTOMY Midline 02/15/2014    Procedure: ROBOTIC PR LAP, GAST RESTRICT PROC, LONGITUDINAL GASTRECTOMY;  Surgeon: Aida Raider, MD;  Location: MAIN OR [facility];  Service: Gastrointestinal   ??? PR UPPER GI ENDOSCOPY,DIAGNOSIS N/A 11/12/2020    Procedure: ESOPHAGOGASTRODUODENOSCOPY;  Surgeon: San Morelle, MD;  Location: ENDO PROCEDURES [facility];  Service: Gastroenterology   ??? TONSILLECTOMY  1982   ??? TOTAL ABDOMINAL HYSTERECTOMY     ??? TUBAL LIGATION  01/12/2000       MEDICATIONS:  Current Outpatient Medications   Medication Sig Dispense Refill   ??? acetaminophen (TYLENOL) 500 MG tablet Take 2 tablets (1,000 mg total) by mouth every eight (8) hours as needed for pain. 30 tablet 0   ??? ergocalciferol-1,250 mcg, 50,000 unit, (DRISDOL) 1,250 mcg (50,000 unit) capsule Take 1 capsule (1,250 mcg total) by mouth once a week. On Thursdays 4 capsule 11   ??? ferrous sulfate 325 (65 FE) MG tablet Take 1 tablet (325 mg total) by mouth daily. 90 tablet 3   ??? fluticasone propionate (FLONASE) 50 mcg/actuation nasal spray 2 sprays into each nostril daily. 16 g 6   ??? melatonin 5 mg tablet Take 5 mg by mouth nightly as needed.     ??? metFORMIN (GLUCOPHAGE-XR) 500 MG 24 hr tablet Take 2 tablets (1,000 mg total) by mouth daily with evening meal. Start with 1 tab daily, increase to 2 tab after 2 weeks. 60 tablet 3   ??? multivitamin (TAB-A-VITE/THERAGRAN) per tablet Take 1 tablet by mouth daily. 90 tablet 3   ??? oxybutynin (DITROPAN-XL) 5 MG 24 hr tablet TAKE 1 TABLET BY MOUTH EVERY MORNING 90 tablet 1   ??? pantoprazole (PROTONIX) 40 MG tablet Take 1 tablet (40 mg total) by mouth daily. 90 tablet 3   ??? PARoxetine (PAXIL) 30 MG tablet Take 2 tablets (60 mg total) by mouth every evening. 60 tablet 1   ??? pregabalin (LYRICA) 75 MG capsule TAKE TWO (2) CAPSULES BY MOUTH TWICE DAILY 120 capsule 4   ??? teriflunomide (AUBAGIO) 14 mg Tab Take 1 tablet (14 mg total) by mouth every evening. 30 tablet 5   ??? traZODone (DESYREL) 50 MG tablet Take 1 tablet (50 mg total) by mouth nightly. 90 tablet 3     No current facility-administered medications for this visit.       ALLERGIES:  Cymbalta [duloxetine], Keflex [cephalexin], and Kelp    FAMILY HISTORY:  family history includes Brain cancer in her father; Breast cancer in her cousin; Cataracts in her mother; Cervical cancer in her cousin; Diabetes in her mother; Fibromyalgia in her mother; Heart disease in her mother; Hyperlipidemia in her father; Hypertension in her mother; Leukemia in her maternal aunt; Lupus in her maternal aunt, maternal aunt, and mother.    SOCIAL HISTORY:  Social History     Tobacco Use   ??? Smoking status: Never   ??? Smokeless tobacco: Never   Vaping Use   ??? Vaping Use: Never used   Substance Use Topics   ??? Alcohol use: No   ??? Drug use: No  OBJECTIVE:   VITAL SIGNS: There were no vitals taken for this visit.  Wt Readings from Last 6 Encounters:   09/03/21 (!) 142.2 kg (313 lb 6.4 oz)   07/29/21 (!) 139.7 kg (308 lb)   04/30/21 (!) 140.6 kg (310 lb)   11/12/20 (!) 143.4 kg (316 lb 2.2 oz)   10/23/20 (!) 142.8 kg (314 lb 12.8 oz)   09/25/20 (!) 145 kg (319 lb 9.6 oz)       PHYSICAL EXAM:  Constitutional: No acute distress, well nourished and well hydrated.   HEENT: EOMI, conjunctiva clear, anicteric   CV: WWP  Lung: Unlabored breathing.   Abdomen: soft, non-distended, non-tender.  Extremities: No edema, well perfused  MSK: No joint swelling or tenderness noted, no deformities  Skin: No rashes, jaundice or skin lesions noted on clothed exam  Neuro: Alert, Oriented x 3. Normal speech.   Mental Status: Thought organized, appropriate affect, pleasantly interactive, not anxious appearing      DIAGNOSTIC STUDIES:  I have reviewed all pertinent diagnostic studies, including:    GI Procedures:  11/12/20 EGD   _______________________________________________________________________________  Impression: ??????????????????????- Normal esophagus.  ??????????????????????????????????????????????- A sleeve gastrectomy was found. Dilated antrum.  ??????????????????????????????????????????????- Normal examined duodenum.  ??????????????????????????????????????????????- No specimens collected.    Radiographic studies:  N/a     Laboratory results:  Lab Results   Component Value Date    WBC 5.5 09/21/2021    HGB 12.8 09/21/2021    HCT 38.4 09/21/2021    PLT 196 09/21/2021     Lab Results   Component Value Date    NA 138 09/21/2021    K 3.9 09/21/2021    CL 107 09/21/2021    CO2 23.7 09/21/2021    BUN 8 (L) 09/21/2021    CREATININE 1.11 (H) 09/21/2021    CALCIUM 8.6 (L) 09/21/2021     Lab Results   Component Value Date    ALKPHOS 65 09/21/2021    BILITOT 0.4 09/21/2021    BILIDIR 0.30 07/24/2019    PROT 6.0 09/21/2021    ALBUMIN 3.0 (L) 09/21/2021    ALT 10 09/21/2021    AST 14 09/21/2021     No results found for: PT, INR, APTT

## 2021-11-18 NOTE — Unmapped (Addendum)
Specialty Medication(s): Aubagio    Victoria Kelly has been dis-enrolled from the [facility] specialty pharmacy services due to multiple unsuccessful outreach attempts by the pharmacy.    Additional information provided to the patient: LVM with [facility] Pharmacy Number    Wiliam Ke  [facility] Specialty Pharmacist

## 2021-12-01 DIAGNOSIS — K219 Gastro-esophageal reflux disease without esophagitis: Principal | ICD-10-CM

## 2021-12-01 MED ORDER — ERGOCALCIFEROL (VITAMIN D2) 1,250 MCG (50,000 UNIT) CAPSULE
ORAL_CAPSULE | ORAL | 11 refills | 28.00000 days | Status: CN
Start: 2021-12-01 — End: 2022-12-01

## 2021-12-01 MED ORDER — PREGABALIN 75 MG CAPSULE
ORAL_CAPSULE | Freq: Two times a day (BID) | ORAL | 1 refills | 30 days | Status: CP
Start: 2021-12-01 — End: ?

## 2021-12-01 MED ORDER — PANTOPRAZOLE 40 MG TABLET,DELAYED RELEASE
ORAL_TABLET | Freq: Every day | ORAL | 3 refills | 90.00000 days | Status: CN
Start: 2021-12-01 — End: 2022-10-27

## 2021-12-02 ENCOUNTER — Other Ambulatory Visit: Payer: Self-pay

## 2021-12-02 ENCOUNTER — Ambulatory Visit (INDEPENDENT_AMBULATORY_CARE_PROVIDER_SITE_OTHER): Payer: Medicare Other | Admitting: Podiatry

## 2021-12-02 ENCOUNTER — Encounter: Payer: Self-pay | Admitting: Podiatry

## 2021-12-02 DIAGNOSIS — L603 Nail dystrophy: Secondary | ICD-10-CM | POA: Diagnosis not present

## 2021-12-02 NOTE — Progress Notes (Signed)
°  Subjective:  Patient ID: Anna Zavala, female    DOB: [birth year],  MRN: [id number] HPI Chief Complaint  Patient presents with   Nail Problem    Hallux right -nail dark, looks like it was bleeding x few weeks, no injury, left hallux was the same way a few months ago, better now   New Patient (Initial Visit)    Est pt 2019    51 y.o. female presents with the above complaint.   ROS: Denies fever chills nausea mobic muscle aches pains calf pain back pain chest pain shortness of breath.  No past medical history on file. No past surgical history on file.  Current Outpatient Medications:    fluticasone (FLONASE) 50 MCG/ACT nasal spray, Place into both nostrils., Disp: , Rfl:    metFORMIN (GLUCOPHAGE-XR) 500 MG 24 hr tablet, Take by mouth., Disp: , Rfl:    oxybutynin (DITROPAN-XL) 5 MG 24 hr tablet, Take 5 mg by mouth daily., Disp: , Rfl:    pantoprazole (PROTONIX) 40 MG tablet, Take 40 mg by mouth daily., Disp: , Rfl:    PARoxetine (PAXIL) 30 MG tablet, START TAKING 1 PILL A DAY, CAN INCREASE TO 2 PILLS A DAY AFTER 2-3 WEEKS., Disp: , Rfl:    pregabalin (LYRICA) 75 MG capsule, Take 150 mg by mouth 2 (two) times daily., Disp: , Rfl:    Teriflunomide 14 MG TABS, TAKE ONE TABLET DAILY - MUST COME TO APPOINTMENT FOR FUTURE FILLS, Disp: , Rfl:    Vitamin D, Ergocalciferol, (DRISDOL) 1.25 MG (50000 UNIT) CAPS capsule, Take 50,000 Units by mouth once a week., Disp: , Rfl:   Allergies  Allergen Reactions   Cephalexin Other (See Comments)    rash   Duloxetine Other (See Comments)    Metal taste in mouth   Iodine Itching   Review of Systems Objective:  There were no vitals filed for this visit.  General: Well developed, nourished, in no acute distress, alert and oriented x3   Dermatological: Skin is warm, dry and supple bilateral. Nails x 10 are well maintained; remaining integument appears unremarkable at this time. There are no open sores, no preulcerative lesions, no rash or signs of  infection present.  Hallux right demonstrates a subungual hematoma which appears to have leaked out around the margin of the nail.  There is no purulence no malodor and does not appear to be floating.  The nail does however demonstrate some thickness and discoloration.  The contralateral hallux nail demonstrates similar thickness and discoloration but no subungual hematoma is present here.  Vascular: Dorsalis Pedis artery and Posterior Tibial artery pedal pulses are 2/4 bilateral with immedate capillary fill time. Pedal hair growth present. No varicosities and no lower extremity edema present bilateral.   Neruologic: Grossly intact via light touch bilateral. Vibratory intact via tuning fork bilateral. Protective threshold with Semmes Wienstein monofilament intact to all pedal sites bilateral. Patellar and Achilles deep tendon reflexes 2+ bilateral. No Babinski or clonus noted bilateral.   Musculoskeletal: No gross boney pedal deformities bilateral. No pain, crepitus, or limitation noted with foot and ankle range of motion bilateral. Muscular strength 5/5 in all groups tested bilateral.  Gait: Unassisted, Nonantalgic.    Radiographs:  None taken  Assessment & Plan:   Assessment: Subungual hematoma cannot rule out nail dystrophy  Plan: Symptoms and skin and nail were taken today for pathologic evaluation.     Ellawyn Wogan T. [place], [place]

## 2021-12-10 DIAGNOSIS — K219 Gastro-esophageal reflux disease without esophagitis: Principal | ICD-10-CM

## 2021-12-10 MED ORDER — PREGABALIN 75 MG CAPSULE
ORAL_CAPSULE | Freq: Two times a day (BID) | ORAL | 1 refills | 30.00000 days
Start: 2021-12-10 — End: ?

## 2021-12-10 MED ORDER — ERGOCALCIFEROL (VITAMIN D2) 1,250 MCG (50,000 UNIT) CAPSULE
ORAL_CAPSULE | ORAL | 11 refills | 28.00000 days
Start: 2021-12-10 — End: 2022-12-10

## 2021-12-10 MED ORDER — PANTOPRAZOLE 40 MG TABLET,DELAYED RELEASE
ORAL_TABLET | Freq: Every day | ORAL | 3 refills | 90.00000 days
Start: 2021-12-10 — End: 2022-11-05

## 2021-12-11 MED ORDER — PREGABALIN 75 MG CAPSULE
ORAL_CAPSULE | Freq: Two times a day (BID) | ORAL | 1 refills | 30 days
Start: 2021-12-11 — End: ?

## 2021-12-11 MED ORDER — ERGOCALCIFEROL (VITAMIN D2) 1,250 MCG (50,000 UNIT) CAPSULE
ORAL_CAPSULE | ORAL | 11 refills | 28 days
Start: 2021-12-11 — End: 2022-12-11

## 2021-12-11 MED ORDER — PANTOPRAZOLE 40 MG TABLET,DELAYED RELEASE
ORAL_TABLET | Freq: Every day | ORAL | 3 refills | 90 days
Start: 2021-12-11 — End: 2022-11-06

## 2021-12-21 DIAGNOSIS — K219 Gastro-esophageal reflux disease without esophagitis: Principal | ICD-10-CM

## 2021-12-21 MED ORDER — ERGOCALCIFEROL (VITAMIN D2) 1,250 MCG (50,000 UNIT) CAPSULE
ORAL | 0 refills | 0 days
Start: 2021-12-21 — End: ?

## 2021-12-21 MED ORDER — PANTOPRAZOLE 40 MG TABLET,DELAYED RELEASE
ORAL_TABLET | Freq: Every day | ORAL | 3 refills | 90 days
Start: 2021-12-21 — End: 2022-11-16

## 2021-12-21 MED ORDER — PREGABALIN 75 MG CAPSULE
ORAL_CAPSULE | Freq: Two times a day (BID) | ORAL | 1 refills | 30 days
Start: 2021-12-21 — End: ?

## 2021-12-23 DIAGNOSIS — G35 Multiple sclerosis: Principal | ICD-10-CM

## 2021-12-23 DIAGNOSIS — N3281 Overactive bladder: Principal | ICD-10-CM

## 2021-12-23 DIAGNOSIS — J329 Chronic sinusitis, unspecified: Principal | ICD-10-CM

## 2021-12-23 MED ORDER — FLUTICASONE PROPIONATE 50 MCG/ACTUATION NASAL SPRAY,SUSPENSION
Freq: Every day | NASAL | 6 refills | 61 days | Status: CP
Start: 2021-12-23 — End: 2022-08-28

## 2021-12-23 MED ORDER — OXYBUTYNIN CHLORIDE ER 5 MG TABLET,EXTENDED RELEASE 24 HR
ORAL_TABLET | Freq: Every morning | ORAL | 2 refills | 90 days | Status: CP
Start: 2021-12-23 — End: 2022-08-28

## 2021-12-23 MED ORDER — ERGOCALCIFEROL (VITAMIN D2) 1,250 MCG (50,000 UNIT) CAPSULE
ORAL | 0 refills | 0 days
Start: 2021-12-23 — End: ?

## 2021-12-23 MED ORDER — METFORMIN ER 500 MG TABLET,EXTENDED RELEASE 24 HR
ORAL_TABLET | Freq: Every day | ORAL | 2 refills | 90 days | Status: CP
Start: 2021-12-23 — End: 2022-08-28

## 2021-12-23 MED ORDER — PANTOPRAZOLE 40 MG TABLET,DELAYED RELEASE
ORAL_TABLET | Freq: Every day | ORAL | 3 refills | 90 days
Start: 2021-12-23 — End: 2022-11-18

## 2021-12-23 MED ORDER — PREGABALIN 75 MG CAPSULE
ORAL_CAPSULE | Freq: Two times a day (BID) | ORAL | 1 refills | 30 days
Start: 2021-12-23 — End: ?

## 2021-12-30 ENCOUNTER — Telehealth: Payer: Self-pay | Admitting: *Deleted

## 2021-12-30 ENCOUNTER — Ambulatory Visit: Payer: Medicare Other | Admitting: Podiatry

## 2021-12-30 MED ORDER — NEOMYCIN-POLYMYXIN-HC 1 % OT SOLN: OTIC | 6 refills | Status: AC

## 2021-12-30 NOTE — Telephone Encounter (Signed)
Cancelled patient's appointment for today. Results were negative for fungus, positive for bacteria. Sent in Rx for cortisporin drops and follow up appt made for 3 months to check the nails. ?

## 2022-01-06 DIAGNOSIS — F32A Depression, unspecified depression type: Principal | ICD-10-CM

## 2022-01-06 DIAGNOSIS — K219 Gastro-esophageal reflux disease without esophagitis: Principal | ICD-10-CM

## 2022-01-06 MED ORDER — PREGABALIN 75 MG CAPSULE
ORAL_CAPSULE | Freq: Two times a day (BID) | ORAL | 1 refills | 30 days
Start: 2022-01-06 — End: ?

## 2022-01-06 MED ORDER — PANTOPRAZOLE 40 MG TABLET,DELAYED RELEASE
ORAL_TABLET | Freq: Every day | ORAL | 3 refills | 90 days
Start: 2022-01-06 — End: 2022-12-02

## 2022-01-18 DIAGNOSIS — F32A Depression, unspecified depression type: Principal | ICD-10-CM

## 2022-01-18 MED ORDER — PAROXETINE 30 MG TABLET
ORAL_TABLET | Freq: Every evening | ORAL | 2 refills | 90 days | Status: CP
Start: 2022-01-18 — End: 2022-09-03

## 2022-01-28 DIAGNOSIS — Z09 Encounter for follow-up examination after completed treatment for conditions other than malignant neoplasm: Principal | ICD-10-CM

## 2022-02-10 ENCOUNTER — Ambulatory Visit
Admit: 2022-02-10 | Discharge: 2022-02-11 | Payer: MEDICARE | Attending: Physician Assistant | Primary: Physician Assistant

## 2022-02-10 DIAGNOSIS — E559 Vitamin D deficiency, unspecified: Principal | ICD-10-CM

## 2022-02-10 DIAGNOSIS — G4733 Obstructive sleep apnea (adult) (pediatric): Principal | ICD-10-CM

## 2022-02-10 DIAGNOSIS — G35 Multiple sclerosis: Principal | ICD-10-CM

## 2022-02-10 MED ORDER — TIZANIDINE 4 MG TABLET
ORAL_TABLET | Freq: Every evening | ORAL | 5 refills | 30 days | Status: CP
Start: 2022-02-10 — End: 2023-02-10

## 2022-02-10 MED ORDER — MODAFINIL 100 MG TABLET
ORAL_TABLET | Freq: Two times a day (BID) | ORAL | 5 refills | 30 days | Status: CP
Start: 2022-02-10 — End: ?

## 2022-02-17 DIAGNOSIS — G35 Multiple sclerosis: Principal | ICD-10-CM

## 2022-03-03 DIAGNOSIS — G35 Multiple sclerosis: Principal | ICD-10-CM

## 2022-03-03 DIAGNOSIS — N3281 Overactive bladder: Principal | ICD-10-CM

## 2022-03-03 MED ORDER — OXYBUTYNIN CHLORIDE ER 5 MG TABLET,EXTENDED RELEASE 24 HR
ORAL_TABLET | 10 refills | 0 days
Start: 2022-03-03 — End: ?

## 2022-03-04 MED ORDER — OXYBUTYNIN CHLORIDE ER 5 MG TABLET,EXTENDED RELEASE 24 HR
ORAL_TABLET | 5 refills | 0 days | Status: CP
Start: 2022-03-04 — End: ?

## 2022-03-10 ENCOUNTER — Ambulatory Visit: Admit: 2022-03-10 | Discharge: 2022-03-11 | Payer: MEDICARE

## 2022-03-10 DIAGNOSIS — E669 Obesity, unspecified: Principal | ICD-10-CM

## 2022-03-10 DIAGNOSIS — I10 Essential (primary) hypertension: Principal | ICD-10-CM

## 2022-03-10 DIAGNOSIS — Z1231 Encounter for screening mammogram for malignant neoplasm of breast: Principal | ICD-10-CM

## 2022-03-10 DIAGNOSIS — R799 Abnormal finding of blood chemistry, unspecified: Principal | ICD-10-CM

## 2022-03-10 MED ORDER — LISINOPRIL 10 MG TABLET
ORAL_TABLET | Freq: Every day | ORAL | 3 refills | 90 days | Status: CP
Start: 2022-03-10 — End: 2023-03-10

## 2022-03-26 ENCOUNTER — Ambulatory Visit
Admit: 2022-03-26 | Discharge: 2022-03-27 | Payer: MEDICARE | Attending: Student in an Organized Health Care Education/Training Program | Primary: Student in an Organized Health Care Education/Training Program

## 2022-03-26 MED ORDER — PANTOPRAZOLE 40 MG TABLET,DELAYED RELEASE
ORAL_TABLET | Freq: Two times a day (BID) | ORAL | 10 refills | 30 days | Status: CP
Start: 2022-03-26 — End: 2023-02-19

## 2022-04-05 DIAGNOSIS — F32A Depression, unspecified depression type: Principal | ICD-10-CM

## 2022-04-05 MED ORDER — PAROXETINE 30 MG TABLET
ORAL_TABLET | Freq: Every evening | ORAL | 2 refills | 90 days | Status: CP
Start: 2022-04-05 — End: 2022-11-19

## 2022-04-05 MED ORDER — PREGABALIN 75 MG CAPSULE
ORAL_CAPSULE | 4 refills | 0 days
Start: 2022-04-05 — End: ?

## 2022-04-07 MED ORDER — PREGABALIN 75 MG CAPSULE
ORAL_CAPSULE | 5 refills | 0 days | Status: CP
Start: 2022-04-07 — End: ?

## 2022-04-29 ENCOUNTER — Ambulatory Visit: Admit: 2022-04-29 | Payer: MEDICARE | Attending: Family | Primary: Family

## 2022-05-07 ENCOUNTER — Ambulatory Visit: Admit: 2022-05-07 | Payer: MEDICARE | Attending: Registered" | Primary: Registered"

## 2022-05-19 ENCOUNTER — Ambulatory Visit: Admit: 2022-05-19 | Discharge: 2022-05-20 | Payer: MEDICARE | Attending: Registered" | Primary: Registered"

## 2022-06-04 ENCOUNTER — Ambulatory Visit: Admit: 2022-06-04 | Discharge: 2022-06-05 | Payer: MEDICARE

## 2022-07-06 ENCOUNTER — Ambulatory Visit: Admit: 2022-07-06 | Discharge: 2022-07-07 | Payer: MEDICARE | Attending: Registered" | Primary: Registered"

## 2022-07-14 ENCOUNTER — Ambulatory Visit: Admit: 2022-07-14 | Discharge: 2022-07-15 | Payer: MEDICARE

## 2022-07-14 DIAGNOSIS — Z01419 Encounter for gynecological examination (general) (routine) without abnormal findings: Principal | ICD-10-CM

## 2022-08-13 MED ORDER — TIZANIDINE 4 MG TABLET
ORAL_TABLET | Freq: Every evening | ORAL | 1 refills | 90 days | Status: CP
Start: 2022-08-13 — End: ?

## 2022-08-18 ENCOUNTER — Ambulatory Visit
Admit: 2022-08-18 | Discharge: 2022-08-19 | Payer: MEDICARE | Attending: Physician Assistant | Primary: Physician Assistant

## 2022-08-18 DIAGNOSIS — G35 Multiple sclerosis: Principal | ICD-10-CM

## 2022-08-18 DIAGNOSIS — N3281 Overactive bladder: Principal | ICD-10-CM

## 2022-08-18 MED ORDER — PREGABALIN 75 MG CAPSULE
ORAL_CAPSULE | Freq: Two times a day (BID) | ORAL | 5 refills | 30 days | Status: CP
Start: 2022-08-18 — End: ?

## 2022-08-18 MED ORDER — MODAFINIL 100 MG TABLET
ORAL_TABLET | Freq: Every day | ORAL | 5 refills | 30 days | Status: CP
Start: 2022-08-18 — End: ?

## 2022-08-18 MED ORDER — OXYBUTYNIN CHLORIDE ER 5 MG TABLET,EXTENDED RELEASE 24 HR
ORAL_TABLET | Freq: Every morning | ORAL | 5 refills | 30 days | Status: CP
Start: 2022-08-18 — End: 2023-02-14

## 2022-08-30 ENCOUNTER — Ambulatory Visit: Admit: 2022-08-30 | Discharge: 2022-08-31 | Payer: MEDICARE

## 2022-08-30 DIAGNOSIS — Z6841 Body Mass Index (BMI) 40.0 and over, adult: Principal | ICD-10-CM

## 2022-08-30 DIAGNOSIS — I10 Essential (primary) hypertension: Principal | ICD-10-CM

## 2022-08-30 DIAGNOSIS — Z1159 Encounter for screening for other viral diseases: Principal | ICD-10-CM

## 2022-08-30 MED ORDER — WEGOVY 0.25 MG/0.5 ML SUBCUTANEOUS PEN INJECTOR
SUBCUTANEOUS | 0 refills | 0 days | Status: CP
Start: 2022-08-30 — End: 2022-09-21

## 2022-08-30 MED ORDER — LISINOPRIL 20 MG-HYDROCHLOROTHIAZIDE 12.5 MG TABLET
ORAL_TABLET | Freq: Every day | ORAL | 3 refills | 90 days | Status: CP
Start: 2022-08-30 — End: ?

## 2022-09-23 MED ORDER — METFORMIN ER 500 MG TABLET,EXTENDED RELEASE 24 HR
ORAL_TABLET | Freq: Every day | ORAL | 2 refills | 90 days | Status: CP
Start: 2022-09-23 — End: 2023-05-29

## 2022-09-28 MED ORDER — PREGABALIN 75 MG CAPSULE
ORAL_CAPSULE | 5 refills | 0 days
Start: 2022-09-28 — End: ?

## 2022-10-01 MED ORDER — PREGABALIN 75 MG CAPSULE
ORAL_CAPSULE | 5 refills | 0 days | Status: CP
Start: 2022-10-01 — End: ?

## 2022-10-21 ENCOUNTER — Ambulatory Visit: Admit: 2022-10-21 | Discharge: 2022-10-22 | Payer: MEDICARE

## 2022-10-21 DIAGNOSIS — N182 Chronic kidney disease, stage 2 (mild): Principal | ICD-10-CM

## 2022-10-21 DIAGNOSIS — F32A Depression, unspecified depression type: Principal | ICD-10-CM

## 2022-10-21 DIAGNOSIS — I10 Essential (primary) hypertension: Principal | ICD-10-CM

## 2022-10-21 DIAGNOSIS — R7989 Other specified abnormal findings of blood chemistry: Principal | ICD-10-CM

## 2022-10-21 DIAGNOSIS — Z7289 Other problems related to lifestyle: Principal | ICD-10-CM

## 2022-10-21 DIAGNOSIS — E669 Obesity, unspecified: Principal | ICD-10-CM

## 2022-10-21 MED ORDER — LISINOPRIL 20 MG TABLET
ORAL_TABLET | Freq: Every day | ORAL | 11 refills | 30 days | Status: CP
Start: 2022-10-21 — End: 2023-10-21

## 2022-10-21 MED ORDER — PAROXETINE 30 MG TABLET
ORAL_TABLET | Freq: Every evening | ORAL | 2 refills | 90 days | Status: CP
Start: 2022-10-21 — End: 2023-07-18

## 2022-11-01 DIAGNOSIS — G35 Multiple sclerosis: Principal | ICD-10-CM

## 2022-11-01 DIAGNOSIS — N3281 Overactive bladder: Principal | ICD-10-CM

## 2022-11-01 MED ORDER — OXYBUTYNIN CHLORIDE ER 5 MG TABLET,EXTENDED RELEASE 24 HR
ORAL_TABLET | Freq: Every morning | ORAL | 10 refills | 0 days
Start: 2022-11-01 — End: ?

## 2022-11-03 ENCOUNTER — Ambulatory Visit: Admit: 2022-11-03 | Payer: MEDICAID

## 2022-11-04 MED ORDER — OXYBUTYNIN CHLORIDE ER 5 MG TABLET,EXTENDED RELEASE 24 HR
ORAL_TABLET | Freq: Every morning | ORAL | 1 refills | 90 days | Status: CP
Start: 2022-11-04 — End: ?

## 2022-11-19 ENCOUNTER — Ambulatory Visit (LOCAL_COMMUNITY_HEALTH_CENTER): Payer: 59

## 2022-11-19 DIAGNOSIS — Z111 Encounter for screening for respiratory tuberculosis: Secondary | ICD-10-CM

## 2022-11-22 ENCOUNTER — Ambulatory Visit (LOCAL_COMMUNITY_HEALTH_CENTER): Payer: 59

## 2022-11-22 DIAGNOSIS — Z111 Encounter for screening for respiratory tuberculosis: Secondary | ICD-10-CM

## 2022-11-22 LAB — TB SKIN TEST
Induration: 0 mm
TB Skin Test: NEGATIVE

## 2022-11-29 ENCOUNTER — Ambulatory Visit: Admit: 2022-11-29 | Payer: MEDICARE

## 2022-12-08 ENCOUNTER — Institutional Professional Consult (permissible substitution): Admit: 2022-12-08 | Discharge: 2022-12-09 | Payer: MEDICARE | Attending: Registered" | Primary: Registered"

## 2022-12-08 DIAGNOSIS — Z6841 Body Mass Index (BMI) 40.0 and over, adult: Principal | ICD-10-CM

## 2022-12-27 MED ORDER — MODAFINIL 100 MG TABLET
ORAL_TABLET | Freq: Every day | ORAL | 5 refills | 30 days
Start: 2022-12-27 — End: ?

## 2022-12-29 MED ORDER — MODAFINIL 100 MG TABLET
ORAL_TABLET | Freq: Every day | ORAL | 5 refills | 30 days | Status: CP
Start: 2022-12-29 — End: ?

## 2023-01-04 ENCOUNTER — Ambulatory Visit: Admit: 2023-01-04 | Discharge: 2023-01-04 | Payer: MEDICAID | Attending: Registered" | Primary: Registered"

## 2023-01-04 ENCOUNTER — Ambulatory Visit: Admit: 2023-01-04 | Discharge: 2023-01-04 | Payer: MEDICAID

## 2023-01-06 ENCOUNTER — Ambulatory Visit: Admit: 2023-01-06 | Payer: MEDICAID | Attending: Registered" | Primary: Registered"

## 2023-01-14 DIAGNOSIS — N2889 Other specified disorders of kidney and ureter: Principal | ICD-10-CM

## 2023-02-09 ENCOUNTER — Ambulatory Visit: Admit: 2023-02-09 | Payer: MEDICAID

## 2023-02-16 ENCOUNTER — Ambulatory Visit: Admit: 2023-02-16 | Discharge: 2023-02-16 | Payer: MEDICARE

## 2023-02-16 DIAGNOSIS — M7989 Other specified soft tissue disorders: Principal | ICD-10-CM

## 2023-02-16 DIAGNOSIS — N182 Chronic kidney disease, stage 2 (mild): Principal | ICD-10-CM

## 2023-02-17 ENCOUNTER — Ambulatory Visit: Admit: 2023-02-17 | Discharge: 2023-02-18 | Payer: MEDICARE

## 2023-02-28 ENCOUNTER — Ambulatory Visit: Admit: 2023-02-28 | Discharge: 2023-03-01 | Payer: MEDICARE

## 2023-02-28 DIAGNOSIS — M7989 Other specified soft tissue disorders: Principal | ICD-10-CM

## 2023-02-28 DIAGNOSIS — R7989 Other specified abnormal findings of blood chemistry: Principal | ICD-10-CM

## 2023-02-28 MED ORDER — FUROSEMIDE 20 MG TABLET
ORAL_TABLET | Freq: Every day | ORAL | 0 refills | 7 days | Status: CP
Start: 2023-02-28 — End: 2023-03-07

## 2023-03-06 DIAGNOSIS — N3281 Overactive bladder: Principal | ICD-10-CM

## 2023-03-06 DIAGNOSIS — G35 Multiple sclerosis: Principal | ICD-10-CM

## 2023-03-06 MED ORDER — TIZANIDINE 4 MG TABLET
ORAL_TABLET | Freq: Every evening | 1 refills | 0 days
Start: 2023-03-06 — End: ?

## 2023-03-06 MED ORDER — OXYBUTYNIN CHLORIDE ER 5 MG TABLET,EXTENDED RELEASE 24 HR
ORAL_TABLET | Freq: Every morning | ORAL | 1 refills | 0 days
Start: 2023-03-06 — End: ?

## 2023-03-07 MED ORDER — OXYBUTYNIN CHLORIDE ER 5 MG TABLET,EXTENDED RELEASE 24 HR
ORAL_TABLET | Freq: Every morning | ORAL | 1 refills | 90 days | Status: CP
Start: 2023-03-07 — End: ?

## 2023-03-07 MED ORDER — TIZANIDINE 4 MG TABLET
ORAL_TABLET | Freq: Every evening | 1 refills | 90 days | Status: CP
Start: 2023-03-07 — End: ?

## 2023-03-28 MED ORDER — PREGABALIN 75 MG CAPSULE
ORAL_CAPSULE | 0 refills | 0 days | Status: CP
Start: 2023-03-28 — End: ?

## 2023-04-05 ENCOUNTER — Emergency Department
Admit: 2023-04-05 | Discharge: 2023-04-05 | Disposition: A | Discharge status: Home / Self Care | Payer: MEDICARE | Attending: Emergency Medicine

## 2023-04-05 ENCOUNTER — Ambulatory Visit
Admit: 2023-04-05 | Discharge: 2023-04-05 | Disposition: A | Discharge status: Home / Self Care | Payer: MEDICARE | Attending: Emergency Medicine

## 2023-04-05 MED ORDER — OXYCODONE 5 MG TABLET
ORAL_TABLET | ORAL | 0 refills | 2 days | Status: CP | PRN
Start: 2023-04-05 — End: 2023-04-10

## 2023-04-05 MED ORDER — LIDOCAINE 5 % TOPICAL PATCH
MEDICATED_PATCH | TRANSDERMAL | 0 refills | 30 days | Status: CP
Start: 2023-04-05 — End: 2023-05-05

## 2023-04-06 MED ORDER — OXYCODONE 5 MG TABLET
ORAL_TABLET | ORAL | 0 refills | 2 days | Status: CP | PRN
Start: 2023-04-06 — End: 2023-04-11

## 2023-04-11 ENCOUNTER — Ambulatory Visit: Admit: 2023-04-11 | Discharge: 2023-04-12 | Payer: MEDICARE

## 2023-04-11 DIAGNOSIS — S32010D Wedge compression fracture of first lumbar vertebra, subsequent encounter for fracture with routine healing: Principal | ICD-10-CM

## 2023-04-11 DIAGNOSIS — Z780 Asymptomatic menopausal state: Principal | ICD-10-CM

## 2023-04-11 DIAGNOSIS — E559 Vitamin D deficiency, unspecified: Principal | ICD-10-CM

## 2023-04-11 MED ORDER — CALCITONIN (SALMON) 200 UNIT/ACTUATION NASAL SPRAY
Freq: Every day | NASAL | 0 refills | 0 days | Status: CP
Start: 2023-04-11 — End: 2024-04-10

## 2023-04-11 MED ORDER — LIDOCAINE 5 % TOPICAL PATCH
MEDICATED_PATCH | Freq: Two times a day (BID) | TRANSDERMAL | 0 refills | 5 days | Status: CP
Start: 2023-04-11 — End: 2023-04-16

## 2023-04-11 MED ORDER — NAPROXEN 500 MG TABLET
ORAL_TABLET | Freq: Two times a day (BID) | ORAL | 0 refills | 30 days | Status: CP
Start: 2023-04-11 — End: 2024-04-10

## 2023-04-15 ENCOUNTER — Ambulatory Visit: Admit: 2023-04-15 | Discharge: 2023-04-16 | Payer: MEDICARE

## 2023-04-18 MED ORDER — CHOLECALCIFEROL (VITAMIN D3) 250 MCG (10,000 UNIT) CAPSULE
ORAL_CAPSULE | Freq: Every day | ORAL | 2 refills | 120 days | Status: CP
Start: 2023-04-18 — End: 2024-04-17

## 2023-04-29 ENCOUNTER — Ambulatory Visit: Admit: 2023-04-29 | Payer: MEDICARE

## 2023-04-29 DIAGNOSIS — N3281 Overactive bladder: Principal | ICD-10-CM

## 2023-04-29 DIAGNOSIS — G35 Multiple sclerosis: Principal | ICD-10-CM

## 2023-04-29 MED ORDER — OXYBUTYNIN CHLORIDE ER 5 MG TABLET,EXTENDED RELEASE 24 HR
ORAL_TABLET | Freq: Every morning | ORAL | 0 refills | 30 days | Status: CP
Start: 2023-04-29 — End: ?

## 2023-04-29 MED ORDER — PREGABALIN 75 MG CAPSULE
ORAL_CAPSULE | Freq: Two times a day (BID) | ORAL | 0 refills | 30 days | Status: CP
Start: 2023-04-29 — End: ?

## 2023-04-29 MED ORDER — TIZANIDINE 4 MG TABLET
ORAL_TABLET | Freq: Every evening | ORAL | 0 refills | 30 days | Status: CP
Start: 2023-04-29 — End: ?

## 2023-05-03 DIAGNOSIS — S32010D Wedge compression fracture of first lumbar vertebra, subsequent encounter for fracture with routine healing: Principal | ICD-10-CM

## 2023-05-03 MED ORDER — CALCITONIN (SALMON) 200 UNIT/ACTUATION NASAL SPRAY
Freq: Every day | NASAL | 1 refills | 0 days
Start: 2023-05-03 — End: ?

## 2023-05-04 MED ORDER — CALCITONIN (SALMON) 200 UNIT/ACTUATION NASAL SPRAY
Freq: Every day | NASAL | 0 refills | 0 days | Status: CP
Start: 2023-05-04 — End: 2023-07-25

## 2023-05-09 ENCOUNTER — Ambulatory Visit: Admit: 2023-05-09 | Payer: MEDICARE

## 2023-05-30 DIAGNOSIS — G35 Multiple sclerosis: Principal | ICD-10-CM

## 2023-05-30 DIAGNOSIS — N3281 Overactive bladder: Principal | ICD-10-CM

## 2023-06-02 DIAGNOSIS — G35 Multiple sclerosis: Principal | ICD-10-CM

## 2023-06-02 DIAGNOSIS — N3281 Overactive bladder: Principal | ICD-10-CM

## 2023-06-02 MED ORDER — TIZANIDINE 4 MG TABLET
ORAL_TABLET | Freq: Every evening | ORAL | 0 refills | 30 days | Status: CP
Start: 2023-06-02 — End: ?

## 2023-06-02 MED ORDER — OXYBUTYNIN CHLORIDE ER 5 MG TABLET,EXTENDED RELEASE 24 HR
ORAL_TABLET | Freq: Every morning | ORAL | 0 refills | 30 days | Status: CP
Start: 2023-06-02 — End: ?

## 2023-06-03 MED ORDER — TIZANIDINE 4 MG TABLET
ORAL_TABLET | Freq: Every evening | ORAL | 0 refills | 0 days
Start: 2023-06-03 — End: ?

## 2023-06-07 MED ORDER — TIZANIDINE 4 MG TABLET
ORAL_TABLET | Freq: Every evening | ORAL | 10 refills | 16 days | Status: CP
Start: 2023-06-07 — End: ?

## 2023-06-10 MED ORDER — PREGABALIN 75 MG CAPSULE
ORAL_CAPSULE | Freq: Two times a day (BID) | ORAL | 0 refills | 30 days
Start: 2023-06-10 — End: ?

## 2023-06-12 MED ORDER — PREGABALIN 75 MG CAPSULE
ORAL_CAPSULE | Freq: Two times a day (BID) | ORAL | 0 refills | 0 days
Start: 2023-06-12 — End: ?

## 2023-06-13 DIAGNOSIS — G35 Multiple sclerosis: Principal | ICD-10-CM

## 2023-06-13 DIAGNOSIS — N3281 Overactive bladder: Principal | ICD-10-CM

## 2023-06-13 MED ORDER — OXYBUTYNIN CHLORIDE ER 5 MG TABLET,EXTENDED RELEASE 24 HR
ORAL_TABLET | 11 refills | 0 days
Start: 2023-06-13 — End: ?

## 2023-06-15 MED ORDER — OXYBUTYNIN CHLORIDE ER 5 MG TABLET,EXTENDED RELEASE 24 HR
ORAL_TABLET | 11 refills | 0 days | Status: CP
Start: 2023-06-15 — End: ?

## 2023-06-16 MED ORDER — PREGABALIN 75 MG CAPSULE
ORAL_CAPSULE | Freq: Two times a day (BID) | ORAL | 0 refills | 30 days | Status: CP
Start: 2023-06-16 — End: ?

## 2023-06-27 ENCOUNTER — Ambulatory Visit: Admit: 2023-06-27 | Discharge: 2023-06-28 | Payer: MEDICARE

## 2023-06-27 DIAGNOSIS — I10 Essential (primary) hypertension: Principal | ICD-10-CM

## 2023-06-27 DIAGNOSIS — Z1231 Encounter for screening mammogram for malignant neoplasm of breast: Principal | ICD-10-CM

## 2023-06-27 DIAGNOSIS — Z1239 Encounter for other screening for malignant neoplasm of breast: Principal | ICD-10-CM

## 2023-06-27 DIAGNOSIS — Z1211 Encounter for screening for malignant neoplasm of colon: Principal | ICD-10-CM

## 2023-06-27 DIAGNOSIS — E559 Vitamin D deficiency, unspecified: Principal | ICD-10-CM

## 2023-06-27 MED ORDER — LISINOPRIL 20 MG TABLET
ORAL_TABLET | Freq: Every day | ORAL | 11 refills | 30 days | Status: CP
Start: 2023-06-27 — End: 2024-06-26

## 2023-06-29 DIAGNOSIS — F32A Depression, unspecified depression type: Principal | ICD-10-CM

## 2023-06-29 DIAGNOSIS — S32010D Wedge compression fracture of first lumbar vertebra, subsequent encounter for fracture with routine healing: Principal | ICD-10-CM

## 2023-06-29 DIAGNOSIS — I10 Essential (primary) hypertension: Principal | ICD-10-CM

## 2023-06-29 DIAGNOSIS — M7989 Other specified soft tissue disorders: Principal | ICD-10-CM

## 2023-06-30 ENCOUNTER — Ambulatory Visit
Admit: 2023-06-30 | Discharge: 2023-07-01 | Payer: MEDICARE | Attending: Student in an Organized Health Care Education/Training Program | Primary: Student in an Organized Health Care Education/Training Program

## 2023-07-01 MED ORDER — TIZANIDINE 4 MG TABLET
ORAL_TABLET | Freq: Every evening | ORAL | 0 refills | 30 days | Status: CP
Start: 2023-07-01 — End: ?

## 2023-07-04 MED ORDER — TIZANIDINE 4 MG TABLET
ORAL_TABLET | 0 refills | 0 days
Start: 2023-07-04 — End: ?

## 2023-07-05 MED ORDER — TIZANIDINE 4 MG TABLET
ORAL_TABLET | 0 refills | 0 days
Start: 2023-07-05 — End: ?

## 2023-07-07 DIAGNOSIS — M7989 Other specified soft tissue disorders: Principal | ICD-10-CM

## 2023-07-07 DIAGNOSIS — F32A Depression, unspecified depression type: Principal | ICD-10-CM

## 2023-07-07 DIAGNOSIS — I10 Essential (primary) hypertension: Principal | ICD-10-CM

## 2023-07-07 MED ORDER — LISINOPRIL 20 MG TABLET
ORAL_TABLET | Freq: Every day | ORAL | 11 refills | 30 days
Start: 2023-07-07 — End: 2024-07-06

## 2023-07-07 MED ORDER — PAROXETINE 30 MG TABLET
ORAL_TABLET | Freq: Every evening | ORAL | 2 refills | 90 days
Start: 2023-07-07 — End: 2024-04-02

## 2023-07-07 MED ORDER — METFORMIN ER 500 MG TABLET,EXTENDED RELEASE 24 HR
ORAL_TABLET | Freq: Every day | ORAL | 2 refills | 90 days
Start: 2023-07-07 — End: 2024-03-11

## 2023-07-07 MED ORDER — FUROSEMIDE 20 MG TABLET
ORAL_TABLET | Freq: Every day | ORAL | 0 refills | 7 days
Start: 2023-07-07 — End: 2023-07-14

## 2023-07-11 ENCOUNTER — Ambulatory Visit: Admit: 2023-07-11 | Discharge: 2023-07-12 | Payer: MEDICARE

## 2023-07-11 DIAGNOSIS — I10 Essential (primary) hypertension: Principal | ICD-10-CM

## 2023-07-11 DIAGNOSIS — F32A Depression, unspecified depression type: Principal | ICD-10-CM

## 2023-07-11 DIAGNOSIS — S32010D Wedge compression fracture of first lumbar vertebra, subsequent encounter for fracture with routine healing: Principal | ICD-10-CM

## 2023-07-11 MED ORDER — LISINOPRIL 40 MG TABLET
ORAL_TABLET | Freq: Every day | ORAL | 11 refills | 30 days | Status: CP
Start: 2023-07-11 — End: 2024-07-10

## 2023-07-11 MED ORDER — TIZANIDINE 4 MG TABLET
ORAL_TABLET | Freq: Every evening | ORAL | 0 refills | 30 days | Status: CP
Start: 2023-07-11 — End: ?

## 2023-07-11 MED ORDER — DICLOFENAC 1 % TOPICAL GEL
Freq: Four times a day (QID) | TOPICAL | 0 refills | 13 days | Status: CP
Start: 2023-07-11 — End: 2024-07-10

## 2023-07-11 MED ORDER — PAROXETINE 30 MG TABLET
ORAL_TABLET | Freq: Every evening | ORAL | 2 refills | 90 days | Status: CP
Start: 2023-07-11 — End: 2024-04-06

## 2023-07-13 ENCOUNTER — Ambulatory Visit: Admit: 2023-07-13 | Discharge: 2023-07-14 | Payer: MEDICARE

## 2023-07-13 DIAGNOSIS — M8008XA Age-related osteoporosis with current pathological fracture, vertebra(e), initial encounter for fracture: Principal | ICD-10-CM

## 2023-07-14 ENCOUNTER — Ambulatory Visit: Admit: 2023-07-14 | Payer: MEDICARE

## 2023-07-15 DIAGNOSIS — S32010D Wedge compression fracture of first lumbar vertebra, subsequent encounter for fracture with routine healing: Principal | ICD-10-CM

## 2023-07-15 MED ORDER — DEXAMETHASONE 1 MG TABLET
ORAL_TABLET | Freq: Once | ORAL | 0 refills | 1 days | Status: CP
Start: 2023-07-15 — End: 2023-07-15

## 2023-07-18 ENCOUNTER — Ambulatory Visit: Admit: 2023-07-18 | Payer: MEDICARE

## 2023-07-18 ENCOUNTER — Ambulatory Visit: Admit: 2023-07-18 | Discharge: 2023-07-19 | Payer: MEDICARE | Attending: Nephrology | Primary: Nephrology

## 2023-07-18 DIAGNOSIS — I151 Hypertension secondary to other renal disorders: Principal | ICD-10-CM

## 2023-07-18 DIAGNOSIS — R7989 Other specified abnormal findings of blood chemistry: Principal | ICD-10-CM

## 2023-07-18 MED ORDER — AMLODIPINE 5 MG TABLET
ORAL | 3 refills | 90 days | Status: CP
Start: 2023-07-18 — End: 2024-07-17

## 2023-07-22 ENCOUNTER — Ambulatory Visit
Admit: 2023-07-22 | Discharge: 2023-07-23 | Payer: MEDICARE | Attending: Physician Assistant | Primary: Physician Assistant

## 2023-07-22 DIAGNOSIS — N3281 Overactive bladder: Principal | ICD-10-CM

## 2023-07-22 DIAGNOSIS — I10 Essential (primary) hypertension: Principal | ICD-10-CM

## 2023-07-22 DIAGNOSIS — G35 Multiple sclerosis: Principal | ICD-10-CM

## 2023-07-22 MED ORDER — OXYBUTYNIN CHLORIDE ER 5 MG TABLET,EXTENDED RELEASE 24 HR
ORAL_TABLET | Freq: Every day | ORAL | 5 refills | 30 days | Status: CP
Start: 2023-07-22 — End: ?

## 2023-07-22 MED ORDER — TIZANIDINE 4 MG TABLET
ORAL_TABLET | Freq: Every evening | ORAL | 5 refills | 30 days | Status: CP
Start: 2023-07-22 — End: ?

## 2023-07-22 MED ORDER — MODAFINIL 100 MG TABLET
ORAL_TABLET | Freq: Every day | ORAL | 5 refills | 60 days | Status: CP
Start: 2023-07-22 — End: ?

## 2023-07-22 MED ORDER — PREGABALIN 75 MG CAPSULE
ORAL_CAPSULE | Freq: Two times a day (BID) | ORAL | 5 refills | 30 days | Status: CP
Start: 2023-07-22 — End: ?

## 2023-08-01 ENCOUNTER — Institutional Professional Consult (permissible substitution): Admit: 2023-08-01 | Discharge: 2023-08-02 | Payer: MEDICARE | Attending: Mental Health | Primary: Mental Health

## 2023-08-02 DIAGNOSIS — G35 Multiple sclerosis: Principal | ICD-10-CM

## 2023-08-02 MED ORDER — TERIFLUNOMIDE 14 MG TABLET
ORAL_TABLET | Freq: Every evening | ORAL | 5 refills | 30 days
Start: 2023-08-02 — End: ?

## 2023-08-03 ENCOUNTER — Ambulatory Visit: Admit: 2023-08-03 | Discharge: 2023-08-04 | Payer: MEDICARE

## 2023-08-03 LAB — CBC W/ AUTO DIFF
BASOPHILS ABSOLUTE COUNT: 0.1 10*9/L (ref 0.0–0.1)
BASOPHILS RELATIVE PERCENT: 0.8 %
EOSINOPHILS ABSOLUTE COUNT: 0 10*9/L (ref 0.0–0.5)
EOSINOPHILS RELATIVE PERCENT: 0.5 %
HEMATOCRIT: 38.8 % (ref 34.0–44.0)
HEMOGLOBIN: 13 g/dL (ref 11.3–14.9)
LYMPHOCYTES ABSOLUTE COUNT: 1.8 10*9/L (ref 1.1–3.6)
LYMPHOCYTES RELATIVE PERCENT: 25.9 %
MEAN CORPUSCULAR HEMOGLOBIN CONC: 33.5 g/dL (ref 32.0–36.0)
MEAN CORPUSCULAR HEMOGLOBIN: 28.3 pg (ref 25.9–32.4)
MEAN CORPUSCULAR VOLUME: 84.4 fL (ref 77.6–95.7)
MEAN PLATELET VOLUME: 7.5 fL (ref 6.8–10.7)
MONOCYTES ABSOLUTE COUNT: 0.2 10*9/L — ABNORMAL LOW (ref 0.3–0.8)
MONOCYTES RELATIVE PERCENT: 3.2 %
NEUTROPHILS ABSOLUTE COUNT: 4.8 10*9/L (ref 1.8–7.8)
NEUTROPHILS RELATIVE PERCENT: 69.6 %
NUCLEATED RED BLOOD CELLS: 0 /100{WBCs} (ref <=4)
PLATELET COUNT: 284 10*9/L (ref 150–450)
RED BLOOD CELL COUNT: 4.59 10*12/L (ref 3.95–5.13)
RED CELL DISTRIBUTION WIDTH: 14.1 % (ref 12.2–15.2)
WBC ADJUSTED: 6.9 10*9/L (ref 3.6–11.2)

## 2023-08-03 LAB — HEPATIC FUNCTION PANEL
ALBUMIN: 3.7 g/dL (ref 3.4–5.0)
ALKALINE PHOSPHATASE: 100 U/L (ref 46–116)
ALT (SGPT): 10 U/L (ref 10–49)
AST (SGOT): 15 U/L (ref <=34)
BILIRUBIN DIRECT: <0.1 mg/dL (ref 0.00–0.30)
BILIRUBIN TOTAL: 0.3 mg/dL (ref 0.3–1.2)
PROTEIN TOTAL: 7.7 g/dL (ref 5.7–8.2)

## 2023-08-03 MED ORDER — TERIFLUNOMIDE 14 MG TABLET
ORAL_TABLET | Freq: Every evening | ORAL | 1 refills | 90 days | Status: CP
Start: 2023-08-03 — End: ?
  Filled 2023-08-11: qty 30, 30d supply, fill #0

## 2023-08-04 NOTE — Unmapped (Signed)
[facility] Specialty Medication Onboarding    Specialty Medication: teriflunomide  Prior Authorization: Not Required   Financial Assistance: No - copay  <$25  Final Copay/Day Supply: $0 / 30    Insurance Restrictions: Yes - max 1 month supply     Notes to Pharmacist: None  Credit Card on File: not applicable    The triage team has completed the benefits investigation and has determined that the patient is able to fill this medication at [facility]. Please contact the patient to complete the onboarding or follow up with the prescribing physician as needed.

## 2023-08-10 ENCOUNTER — Ambulatory Visit: Admit: 2023-08-10 | Discharge: 2023-08-11 | Disposition: A | Discharge status: Home / Self Care | Payer: MEDICARE

## 2023-08-10 ENCOUNTER — Emergency Department: Admit: 2023-08-10 | Discharge: 2023-08-11 | Disposition: A | Discharge status: Home / Self Care | Payer: MEDICARE

## 2023-08-10 DIAGNOSIS — J40 Bronchitis, not specified as acute or chronic: Principal | ICD-10-CM

## 2023-08-10 LAB — CBC W/ AUTO DIFF
BASOPHILS ABSOLUTE COUNT: 0 10*9/L (ref 0.0–0.1)
BASOPHILS RELATIVE PERCENT: 0.5 %
EOSINOPHILS ABSOLUTE COUNT: 0.1 10*9/L (ref 0.0–0.5)
EOSINOPHILS RELATIVE PERCENT: 2.4 %
HEMATOCRIT: 37.1 % (ref 34.0–44.0)
HEMOGLOBIN: 12.3 g/dL (ref 11.3–14.9)
LYMPHOCYTES ABSOLUTE COUNT: 1.8 10*9/L (ref 1.1–3.6)
LYMPHOCYTES RELATIVE PERCENT: 29.3 %
MEAN CORPUSCULAR HEMOGLOBIN CONC: 33.3 g/dL (ref 32.0–36.0)
MEAN CORPUSCULAR HEMOGLOBIN: 28.4 pg (ref 25.9–32.4)
MEAN CORPUSCULAR VOLUME: 85.3 fL (ref 77.6–95.7)
MEAN PLATELET VOLUME: 7.3 fL (ref 6.8–10.7)
MONOCYTES ABSOLUTE COUNT: 0.3 10*9/L (ref 0.3–0.8)
MONOCYTES RELATIVE PERCENT: 5 %
NEUTROPHILS ABSOLUTE COUNT: 3.9 10*9/L (ref 1.8–7.8)
NEUTROPHILS RELATIVE PERCENT: 62.8 %
NUCLEATED RED BLOOD CELLS: 0 /100{WBCs} (ref <=4)
PLATELET COUNT: 251 10*9/L (ref 150–450)
RED BLOOD CELL COUNT: 4.35 10*12/L (ref 3.95–5.13)
RED CELL DISTRIBUTION WIDTH: 14 % (ref 12.2–15.2)
WBC ADJUSTED: 6.2 10*9/L (ref 3.6–11.2)

## 2023-08-10 LAB — BASIC METABOLIC PANEL
ANION GAP: 6 mmol/L (ref 5–14)
BLOOD UREA NITROGEN: 23 mg/dL (ref 9–23)
BUN / CREAT RATIO: 19
CALCIUM: 9.3 mg/dL (ref 8.7–10.4)
CHLORIDE: 109 mmol/L — ABNORMAL HIGH (ref 98–107)
CO2: 27.9 mmol/L (ref 20.0–31.0)
CREATININE: 1.23 mg/dL — ABNORMAL HIGH
EGFR CKD-EPI (2021) FEMALE: 52 mL/min/{1.73_m2} — ABNORMAL LOW (ref >=60)
GLUCOSE RANDOM: 97 mg/dL (ref 70–179)
POTASSIUM: 4.3 mmol/L (ref 3.4–4.8)
SODIUM: 143 mmol/L (ref 135–145)

## 2023-08-10 MED ORDER — BENZONATATE 100 MG CAPSULE
ORAL_CAPSULE | Freq: Three times a day (TID) | ORAL | 0 refills | 7 days | Status: CP | PRN
Start: 2023-08-10 — End: 2023-08-17

## 2023-08-10 MED ORDER — PREDNISONE 20 MG TABLET
ORAL_TABLET | Freq: Every day | ORAL | 0 refills | 5 days | Status: CP
Start: 2023-08-10 — End: 2023-08-15

## 2023-08-10 MED ADMIN — albuterol 2.5 mg /3 mL (0.083 %) nebulizer solution 5 mg: 5 mg | RESPIRATORY_TRACT | Stop: 2023-08-10

## 2023-08-10 NOTE — Unmapped (Signed)
[facility]    Patient Onboarding/Medication Counseling    Victoria Kelly is a 52 y.o. female with multiple sclerosis who I am counseling today on  re-initiation  of therapy.  I am speaking to the patient.    Was a Nurse, learning disability used for this call? No    Verified patient's date of birth / HIPAA.    Specialty medication(s) to be sent: Neurology: teriflunomide      Non-specialty medications/supplies to be sent: none      Medications not needed at this time: n/a       Aubagio (teriflunomide)    Medication & Administration     Dosage: Multiple sclerosis: Take 1 tablet (14mg ) by mouth once daily.    Administration: Take Aubagio by mouth once daily without regard to meals    Adherence/Missed dose instructions: Take a missed dose as soon as you remember. If it is close to the time of your next dose, skip the missed dose and resume your normal schedule. Do not take 2 doses at once.    Lab tests required prior to treatment initiation:  Tuberculosis: Tuberculosis screening resulted in a non-reactive Quantiferon TB Gold assay.  For female patients - is there a documented pregnancy test? Pregnancy test yielded negative result.  CBC: CBC complete and WNL.    Goals of Therapy     Reduce the frequency of flare-ups   Slow disease progression    Side Effects & Monitoring Parameters     Common side effects:  Upset stomach/nausea/diarrhea  Headache  Hair thinning/loss  Joint pain    The following side effects should be reported to the provider:  Signs of an allergic reaction  Signs of liver problems (yellowing of the skin/eyes, light colored stool, dark urine, severe stomach pain/vomiting)  Signs of high blood pressure (severe headache/dizziness, passing out, changes in vision)  Swollen glands  Burning, numbness or tingling that is not normal  Signs of infection  Unexplained bruising or bleeding  Severe fatigue  Signs of Stevens-Johnson syndrome (redness, swelling, blistering or feeling of the skin; red or irritated eyes; sores in the mouth, throat, nose or eyes)  Shortness of breath, difficulty breathing or new or worsening cough      Warnings & Precautions     Contraindications:  Pregnancy  Coadministrating with leflunomide  Warnings:  Hepatotoxicity (LFTs within 6 months prior to initiation, monthly for the first 6 months of therapy then every 6 months or as clinically indicated)  Reproductive concerns (both women AND men must use contraception while taking Aubagio)  Hypertension (blood pressure should be monitored prior to initiation and periodically thereafter)    Drug/Food Interactions     Medication list reviewed in Epic. The patient was instructed to inform the care team before taking any new medications or supplements. No drug interactions identified.   Immunization catch-up should be completed prior to initiating Aubagio. Live vaccines should be avoided during therapy.     Storage, Handling Precautions, & Disposal     Store at room temperature in the original labelled package.  [name] is considered a hazardous agent, it is recommended that anyone other than the patient should wear gloves if handling.      Current Medications (including OTC/herbals), Comorbidities and Allergies     Current Outpatient Medications   Medication Sig Dispense Refill    acetaminophen (TYLENOL) 500 MG tablet Take 2 tablets (1,000 mg total) by mouth every eight (8) hours as needed for pain. 30 tablet 0  amlodipine (NORVASC) 5 MG tablet Take 1 tablet (5 mg total) by mouth daily. (Patient taking differently: Take 1 tablet (5 mg total) by mouth daily. Not pick up yet) 90 tablet 3    ascorbic acid, vitamin C, (ASCORBIC ACID) 250 mg Chew Chew.      calcitonin, salmon, (FORTICAL/MIACALCIN) 200 unit/actuation nasal spray 1 SPRAY INTO ALTERNATING NOSTRILS DAILY. 7.4 mL 0    cholecalciferol, vitamin D3-100 mcg, 4,000 unit,, 100 mcg (4,000 unit) Tab Take 1 tablet (100 mcg total) by mouth in the morning.      diclofenac sodium (VOLTAREN) 1 % gel Apply 2 g topically four (4) times a day. (Patient taking differently: Apply 2 g topically four (4) times a day. Not pick up yet) 100 g 0    ferrous sulfate 325 (65 FE) MG tablet Take 1 tablet (325 mg total) by mouth daily. 90 tablet 3    fluticasone propionate (FLONASE) 50 mcg/actuation nasal spray 2 sprays into each nostril daily. 16 g 6    lisinopril (PRINIVIL,ZESTRIL) 40 MG tablet Take 1 tablet (40 mg total) by mouth daily. 30 tablet 11    modafinil (PROVIGIL) 100 MG tablet Take 2 tablets (200 mg total) by mouth daily. 120 tablet 5    multivitamin (TAB-A-VITE/THERAGRAN) per tablet Take 1 tablet by mouth daily. 90 tablet 3    oxybutynin (DITROPAN-XL) 5 MG 24 hr tablet Take 1 tablet (5 mg total) by mouth daily. 30 tablet 5    PARoxetine (PAXIL) 30 MG tablet Take 2 tablets (60 mg total) by mouth every evening. 180 tablet 2    pregabalin (LYRICA) 75 MG capsule Take 2 capsules (150 mg total) by mouth two (2) times a day. 120 capsule 5    teriflunomide (AUBAGIO) 14 mg Tab Take 1 tablet (14 mg total) by mouth every evening. 90 tablet 1    tizanidine (ZANAFLEX) 4 MG tablet Take 1 tablet (4 mg total) by mouth nightly. 30 tablet 5     No current facility-administered medications for this visit.       Allergies   Allergen Reactions    Cymbalta [Duloxetine] Other (See Comments)     Metal taste in mouth      Keflex [Cephalexin] Other (See Comments)     rash    Kelp Itching       Patient Active Problem List   Diagnosis    Multiple sclerosis (CMS-HCC)    Dysthymic disorder    Obesity    Obstructive sleep apnea    Weakness of wrist    Ankle arthritis    Allergic rhinitis    Insomnia    Low back pain    Iron deficiency anemia    Acne    H/O cataract removal with insertion of prosthetic lens    Hyperlipidemia    History of bariatric surgery    Elevated serum creatinine    Morbid (severe) obesity due to excess calories (CMS-HCC)    S/P TAH (total abdominal hysterectomy)    Rash    Well woman exam with routine gynecological exam    Stage 3a chronic kidney disease (CMS-HCC)       Reviewed and up to date in Epic.    Appropriateness of Therapy     Acute infections noted within Epic:  No active infections  Patient reported infection: None    Is the medication and dose appropriate based on diagnosis, medication list, comorbidities, allergies, medical history, patient???s ability to self-administer the medication, and therapeutic goals? Yes  Prescription has been clinically reviewed: Yes      Baseline Quality of Life Assessment      How many days over the past month did your MS  keep you from your normal activities? For example, brushing your teeth or getting up in the morning. Patient declined to answer    Financial Information     Medication Assistance provided: None Required    Anticipated copay of $0 reviewed with patient. Verified delivery address.    Delivery Information     Scheduled delivery date: 08/12/23    Expected start date: 08/12/23      Medication will be delivered via Next Day Courier to the prescription address in [facility].  This shipment will not require a signature.      Explained the services we provide at [facility] and that each month we would call to set up refills.  Stressed importance of returning phone calls so that we could ensure they receive their medications in time each month.  Informed patient that we should be setting up refills 7-10 days prior to when they will run out of medication.  A pharmacist will reach out to perform a clinical assessment periodically.  Informed patient that a welcome packet, containing information about our pharmacy and other support services, a Notice of Privacy Practices, and a drug information handout will be sent.      The patient or caregiver noted above participated in the development of this care plan and knows that they can request review of or adjustments to the care plan at any time.      Patient or caregiver verbalized understanding of the above information as well as how to contact the pharmacy at [phone] option 4 with any questions/concerns.  The pharmacy is open Monday through Friday 8:30am-4:30pm.  A pharmacist is available 24/7 via pager to answer any clinical questions they may have.    Patient Specific Needs     Does the patient have any physical, cognitive, or cultural barriers? No    Does the patient have adequate living arrangements? (i.e. the ability to store and take their medication appropriately) Yes    Did you identify any home environmental safety or security hazards? No    Patient prefers to have medications discussed with  Patient     Is the patient or caregiver able to read and understand education materials at a high school level or above? Yes    Patient's primary language is  English     Is the patient high risk? No    SOCIAL DETERMINANTS OF HEALTH     At the [facility], we have learned that life circumstances - like trouble affording food, housing, utilities, or transportation can affect the health of many of our patients.   That is why we wanted to ask: are you currently experiencing any life circumstances that are negatively impacting your health and/or quality of life? Patient declined to answer    Social Determinants of Health     Food Insecurity: Food Insecurity Present (05/28/2022)    Hunger Vital Sign     Worried About Running Out of Food in the Last Year: Sometimes true     Ran Out of Food in the Last Year: Sometimes true   Internet Connectivity: Not on file   Housing/Utilities: High Risk (05/28/2022)    Housing/Utilities     Within the past 12 months, have you ever stayed: outside, in a car, in a tent, in  an overnight shelter, or temporarily in someone else's home (i.e. couch-surfing)?: Yes     Are you worried about losing your housing?: Yes     Within the past 12 months, have you been unable to get utilities (heat, electricity) when it was really needed?: Yes   Tobacco Use: Low Risk  (07/22/2023)    Patient History     Smoking Tobacco Use: Never     Smokeless Tobacco Use: Never     Passive Exposure: Not on file   Transportation Needs: Unmet Transportation Needs (05/28/2022)    PRAPARE - Therapist, art (Medical): Yes     Lack of Transportation (Non-Medical): Yes   Alcohol Use: Not At Risk (04/30/2021)    Alcohol Use     How often do you have a drink containing alcohol?: Never     How many drinks containing alcohol do you have on a typical day when you are drinking?: 1 - 2     How often do you have 5 or more drinks on one occasion?: Never   Interpersonal Safety: Unknown (08/10/2023)    Interpersonal Safety     Unsafe Where You Currently Live: Not on file     Physically Hurt by Anyone: Not on file     Abused by Anyone: Not on file   Physical Activity: Not on file   Intimate Partner Violence: Not At Risk (04/30/2021)    Humiliation, Afraid, Rape, and Kick questionnaire     Fear of Current or Ex-Partner: No     Emotionally Abused: No     Physically Abused: No     Sexually Abused: No   Stress: Not on file   Substance Use: Low Risk  (08/30/2022)    Substance Use     Taken prescription drugs for non-medical reasons: Never     Taken illegal drugs: Never     Patient indicated they have taken drugs in the past year for non-medical reasons: Yes, [positive answer(s)]: Not on file   Social Connections: Not on file   Financial Resource Strain: Medium Risk (05/28/2022)    Overall Financial Resource Strain (CARDIA)     Difficulty of Paying Living Expenses: Somewhat hard   Depression: Not at risk (04/11/2023)    PHQ-2     PHQ-2 Score: 1   Health Literacy: Not on file       Would you be willing to receive help with any of the needs that you have identified today? Not applicable       Arnold Long, PharmD  [facility] Specialty Pharmacist

## 2023-08-11 MED ADMIN — albuterol (PROVENTIL HFA;VENTOLIN HFA) 90 mcg/actuation inhaler 2 puff: 2 | RESPIRATORY_TRACT | @ 02:00:00 | Stop: 2023-08-10

## 2023-08-11 MED ADMIN — inhalational spacing device Spcr 1 each: 1 | RESPIRATORY_TRACT | @ 02:00:00 | Stop: 2023-08-10

## 2023-08-11 NOTE — Unmapped (Signed)
Pt states cough ongoing for one week.  Pt states nonproductive.  Pt has not tested at home.  Pt states when she cough she has a sternal pain 10/10.    Pt increased on lisinopril recently.

## 2023-08-11 NOTE — Unmapped (Signed)
[facility]  Emergency Department Provider Note        ED Clinical Impression      Final diagnoses:   Bronchitis (Primary)           Impression, ED Course, Assessment and Plan      Impression: Victoria Kelly is a 52 y.o. female with PMH significant for GERD, depression, HTN, HLD, IDA, MS, OSA, stage III [name] who presents to the emergency department for a cough.  Hypertensive in triage, nontoxic in appearance.  On exam, patient has a productive cough with faint expiratory wheeze in the bases bilaterally, voice hoarseness noted, irritated appearing oropharynx.    Ddx includes viral URI versus PNA versus bronchitis.  Less likely ACS however will obtain EKG, basic labs.  4 Plex negative in triage.  CXR to exclude focal infiltrate.  Albuterol neb provided.    10:14 PM  Labs overall unremarkable compared to previous.  EKG without any notable ischemic changes, no S1Q3T3.  CXR without any focal infiltrate.  Will place on a short steroid taper, Tessalon Perles added which she may take in addition to the Coricidin.  Albuterol inhaler and spacer provided which she may use as needed for wheezing.  Recommended humidification to CPAP, hydration and rest.  PCP follow-up if symptoms persist.  Strict reevaluation criteria were provided and patient verbalized understanding agreeable to discharge.         Additional Medical Decision Making     I have reviewed the vital signs and the nursing notes. Labs and radiology results that were available during my care of the patient were independently reviewed by me and considered in my medical decision making.     I directly visualized and independently interpreted the EKG tracing.   I independently visualized the radiology images.   I reviewed the patient's prior medical records.     Portions of this record have been created using Scientist, clinical (histocompatibility and immunogenetics). Dictation errors have been sought, but may not have been identified and corrected.  ____________________________________________         History        Chief Complaint  Cough      HPI   Victoria Kelly is a 52 y.o. female with PMH significant for GERD, depression, HTN, HLD, IDA, MS, OSA, stage III [name] who presents to the emergency department for a cough.  Onset of symptoms 1 week ago.  States that she is had a persistent nonproductive cough for the last week that is progressively worsened.  Having chest wall pain which she notes primarily with coughing.  Feels that she is wheezing intermittently.  Denies any history of underlying lung disorders, though has never required inhaler previously.  No fevers, hemoptysis, lightheadedness/dizziness, vomiting/diarrhea, lower extremity edema.  No history of underlying lung disorders.  She does note that her grandson is sick, also is staying with a family member that smokes which she is not generally around, unclear if they are related.  Has been using OTC Coricidin without any improvement.      Past Medical History:   Diagnosis Date    Acid reflux     Allergic rhinitis, cause unspecified 04/11/2013    Depression     Dysthymic disorder 02/10/2012    Generalized anxiety disorder 02/10/2012    H/O bariatric surgery     High blood cholesterol level 05/31/2013    Hx of trichomonal vaginitis 02/2006    On Pap smear    Hypertension     Insomnia  05/23/2013    Getting ambien through psych      Iron deficiency anemia 05/24/2013    MS (multiple sclerosis) (CMS-HCC) 11/23/2001    last flare up was 12/2019    Obesity, Class III, BMI 40-49.9 (morbid obesity) (CMS-HCC)     Obstructive sleep apnea 11/01/2011    uses cpap        Patient Active Problem List   Diagnosis    Multiple sclerosis (CMS-HCC)    Dysthymic disorder    Obesity    Obstructive sleep apnea    Weakness of wrist    Ankle arthritis    Allergic rhinitis    Insomnia    Low back pain    Iron deficiency anemia    Acne    H/O cataract removal with insertion of prosthetic lens    Hyperlipidemia History of bariatric surgery    Elevated serum creatinine    Morbid (severe) obesity due to excess calories (CMS-HCC)    S/P TAH (total abdominal hysterectomy)    Rash    Well woman exam with routine gynecological exam    Stage 3a chronic kidney disease (CMS-HCC)       Past Surgical History:   Procedure Laterality Date    CATARACT EXTRACTION Bilateral 08/14/2003, 09/09/2003    CESAREAN SECTION  01/12/2000    COSMETIC SURGERY      DEBRIDEMENT LEG Left 09/30/1999    Spider bite    ENDOMETRIAL ABLATION  02/11/2005    HYSTERECTOMY      OOPHORECTOMY      PR LAP, GAST RESTRICT PROC, LONGITUDINAL GASTRECTOMY Midline 02/15/2014    Procedure: ROBOTIC PR LAP, GAST RESTRICT PROC, LONGITUDINAL GASTRECTOMY;  Surgeon: Aida Raider, MD;  Location: MAIN OR [place];  Service: Gastrointestinal    PR UPPER GI ENDOSCOPY,DIAGNOSIS N/A 11/12/2020    Procedure: ESOPHAGOGASTRODUODENOSCOPY;  Surgeon: San Morelle, MD;  Location: ENDO PROCEDURES [place];  Service: Gastroenterology    TONSILLECTOMY  1982    TOTAL ABDOMINAL HYSTERECTOMY      TUBAL LIGATION  01/12/2000         Current Facility-Administered Medications:     albuterol (PROVENTIL HFA;VENTOLIN HFA) 90 mcg/actuation inhaler 2 puff, 2 puff, Inhalation, Once, Chrissie Noa, FNP    inhalational spacing device Spcr 1 each, 1 each, Inhalation, Once, Chrissie Noa, FNP    Current Outpatient Medications:     acetaminophen (TYLENOL) 500 MG tablet, Take 2 tablets (1,000 mg total) by mouth every eight (8) hours as needed for pain., Disp: 30 tablet, Rfl: 0    amlodipine (NORVASC) 5 MG tablet, Take 1 tablet (5 mg total) by mouth daily. (Patient taking differently: Take 1 tablet (5 mg total) by mouth daily. Not pick up yet), Disp: 90 tablet, Rfl: 3    ascorbic acid, vitamin C, (ASCORBIC ACID) 250 mg Chew, Chew., Disp: , Rfl:     benzonatate (TESSALON) 100 MG capsule, Take 1 capsule (100 mg total) by mouth Three (3) times a day as needed for cough for up to 7 days., Disp: 21 capsule, Rfl: 0    calcitonin, salmon, (FORTICAL/MIACALCIN) 200 unit/actuation nasal spray, 1 SPRAY INTO ALTERNATING NOSTRILS DAILY., Disp: 7.4 mL, Rfl: 0    cholecalciferol, vitamin D3-100 mcg, 4,000 unit,, 100 mcg (4,000 unit) Tab, Take 1 tablet (100 mcg total) by mouth in the morning., Disp: , Rfl:     diclofenac sodium (VOLTAREN) 1 % gel, Apply 2 g topically four (4) times a day. (Patient taking differently: Apply 2 g topically four (  4) times a day. Not pick up yet), Disp: 100 g, Rfl: 0    ferrous sulfate 325 (65 FE) MG tablet, Take 1 tablet (325 mg total) by mouth daily., Disp: 90 tablet, Rfl: 3    fluticasone propionate (FLONASE) 50 mcg/actuation nasal spray, 2 sprays into each nostril daily., Disp: 16 g, Rfl: 6    lisinopril (PRINIVIL,ZESTRIL) 40 MG tablet, Take 1 tablet (40 mg total) by mouth daily., Disp: 30 tablet, Rfl: 11    modafinil (PROVIGIL) 100 MG tablet, Take 2 tablets (200 mg total) by mouth daily., Disp: 120 tablet, Rfl: 5    multivitamin (TAB-A-VITE/THERAGRAN) per tablet, Take 1 tablet by mouth daily., Disp: 90 tablet, Rfl: 3    oxybutynin (DITROPAN-XL) 5 MG 24 hr tablet, Take 1 tablet (5 mg total) by mouth daily., Disp: 30 tablet, Rfl: 5    PARoxetine (PAXIL) 30 MG tablet, Take 2 tablets (60 mg total) by mouth every evening., Disp: 180 tablet, Rfl: 2    predniSONE (DELTASONE) 20 MG tablet, Take 2 tablets (40 mg total) by mouth daily for 5 days., Disp: 10 tablet, Rfl: 0    pregabalin (LYRICA) 75 MG capsule, Take 2 capsules (150 mg total) by mouth two (2) times a day., Disp: 120 capsule, Rfl: 5    teriflunomide (AUBAGIO) 14 mg Tab, Take 1 tablet (14 mg total) by mouth every evening., Disp: 90 tablet, Rfl: 1    tizanidine (ZANAFLEX) 4 MG tablet, Take 1 tablet (4 mg total) by mouth nightly., Disp: 30 tablet, Rfl: 5    Allergies  Cymbalta [duloxetine], Keflex [cephalexin], and Kelp    Family History   Problem Relation Age of Onset    Lupus Mother     Hypertension Mother     Fibromyalgia Mother Heart disease Mother         aortic valve replacement    Cataracts Mother     Diabetes Mother     Brain cancer Father         Brain tumor    Hyperlipidemia Father     Leukemia Maternal Aunt     Lupus Maternal Aunt     Lupus Maternal Aunt     Breast cancer Cousin     Cervical cancer Cousin        Social History  Social History     Tobacco Use    Smoking status: Never    Smokeless tobacco: Never   Vaping Use    Vaping status: Never Used   Substance Use Topics    Alcohol use: No    Drug use: No          Physical Exam     This provider entered the patient's room: Yes:    If this provider did not enter the room, a comprehensive physical exam was not able to be performed due to increased infection risk to themselves, other providers, staff and other patients), as well as to conserve personal protective equipment (PPE) utilization during the COVID-19 pandemic.    If this provider did enter the patient room, the following was PPE worn: N95, eye protection and gloves    ED Triage Vitals [08/10/23 1819]   Enc Vitals Group      BP 175/88      Heart Rate 60      SpO2 Pulse       Resp 20      Temp 36.2 ??C (97.1 ??F)      Temp src  SpO2 100 %      Weight                                    Constitutional: Alert and oriented.  Fatigued appearing however no acute distress.  Eyes: Conjunctivae are normal.  ENT       Head: Normocephalic and atraumatic.       Nose: No congestion.       Mouth/Throat: Mucous membranes are moist.  Posterior oropharynx erythematous with cobblestoning.  No tonsillar inflammation or exudate.       Mary: Bilateral TMs pearly gray without erythema inflammation.       Neck: No stridor.  Hematological/Lymphatic/Immunilogical: No cervical lymphadenopathy.  Cardiovascular: Normal rate, regular rhythm. No murmurs, rubs or gallops.  Respiratory: Normal respiratory effort.  Speaking in full sentences without any difficulty.  Forceful productive cough noted on exam.  Breath sounds are normal in all lobes with faint expiratory wheeze in the bases.  Neurologic: Normal speech and language. No gross focal neurologic deficits are appreciated.  GCS 15.  Skin: Skin is warm, dry and intact. No rash noted.  Psychiatric: Mood and affect are normal. Speech and behavior are normal.     EKG     Normal rate, regular rhythm, 65 bpm  Left axis deviation  Normal intervals  No significant ST elevation or depression     Radiology     XR Chest 2 views   Final Result      Normal chest.             Procedures     N/a           Chrissie Noa, FNP  08/10/23 2218

## 2023-08-15 ENCOUNTER — Ambulatory Visit
Admit: 2023-08-15 | Discharge: 2023-08-16 | Payer: MEDICARE | Attending: Student in an Organized Health Care Education/Training Program | Primary: Student in an Organized Health Care Education/Training Program

## 2023-08-15 DIAGNOSIS — M47816 Spondylosis without myelopathy or radiculopathy, lumbar region: Principal | ICD-10-CM

## 2023-08-15 DIAGNOSIS — G35 Multiple sclerosis: Principal | ICD-10-CM

## 2023-08-15 MED ORDER — LORAZEPAM 0.5 MG TABLET
ORAL_TABLET | ORAL | 0 refills | 0 days | Status: CP | PRN
Start: 2023-08-15 — End: ?

## 2023-08-15 NOTE — Unmapped (Signed)
Addended by: Delories Heinz B on: 08/15/2023 12:06 PM     Modules accepted: Orders

## 2023-08-15 NOTE — Unmapped (Signed)
Dear Ms. Tsuchiya,    Thank you for coming to see Dr[facility] All[facility] today.    -I have ordered physical therapy  -You may trial over the counter topical lidocaine patches over area of pain up to 12 hours   -You may trial over the counter topical voltaren gel as needed over area of pain up to 4 times a day  -I have ordered an MRI of your lumbar spine  -Please Return in about 2 months (around 10/15/2023).    You may contact me through MyChart or call our office with any questions.     If you develop any red flag signs such as new or progressive motor weakness, sensory deficits, saddle anesthesia (groin numbness), bowel/bladder dysfunction, gait/coordination disturbance, weight loss, or night pain, you should call our office and/or seek prompt evaluation at the nearest ER.    Delories Heinz, DO  Interventional Pain Medicine

## 2023-08-15 NOTE — Unmapped (Signed)
[facility]     Referring Provider: Merton Border*  Primary Care Provider: Durene Romans, MD    Victoria Kelly is a 52 y.o. old female  has a past medical history of Acid reflux, Allergic rhinitis, cause unspecified (04/11/2013), Depression, Dysthymic disorder (02/10/2012), Generalized anxiety disorder (02/10/2012), H/O bariatric surgery, High blood cholesterol level (05/31/2013), trichomonal vaginitis (02/2006), Hypertension, Insomnia (05/23/2013), Iron deficiency anemia (05/24/2013), MS (multiple sclerosis) (CMS-HCC) (11/23/2001), Obesity, Class III, BMI 40-49.9 (morbid obesity) (CMS-HCC), and Obstructive sleep apnea (11/01/2011). who is being seen today by Delories Heinz, DO, at the consultation request of [place], Clara Josephin* for new patient office visit.    Pertinent medical records reviewed for this visit.    Chief Complaint:   Chief Complaint   Patient presents with    Back Pain     Bilat leg pain as well     History of Present Illness:    Notable hx of MS     Pain Description:  The pain started many years ago without inciting event; worse after having children; has falls about 1 time a year - most recent fall May/June 2024 where she fell in the shower   Since the pain first started, it is worsening.  The most significant location of pain is the low back/tailbone without radicular symptoms   The pain improves with nothing .  The pain is made worse with standing from seated position .  The average daily pain score is 9/10.   The pain interferes with:  working, daily activities and movements .    Red flag symptoms:  Weakness: Yes - LUE from MS   Bladder incontinence: No   Bowel incontinence: No   Saddle anesthesia: No     Therapies:  Previously attempted interventions for this pain concern:  None    Has physical therapy been initiated or completed for this pain concern? No  Where was physical therapy completed? Not applicable  Has a home exercise program (directed by a physical therapist or medical provider) been completed for this pain concern? NA    Has chiropractic been completed for this pain concern? No     Medications:  Currently utilized over the counter medications/therapy for pain: Acetaminophen/Tylenol    Currently utilized prescription medications/therapies for pain: NSAID/Antiinflammatories , Muscle relaxers , and Membrane stabilizers (gabapentin/lyrica)  -Naproxen- completed course  -Tizanidine 4mg  PRN  -Lyrica 75mg  BID    Currently utilized controlled medications: None    Is the patient on a blood thinner (including aspirin, Goody/BC/Bayer powder)? No   - Name of anticoagulant/blood thinner: Not applicable  ____________________________________________________________________  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    Generalized anxiety disorder 02/10/2012    H/O bariatric surgery     High blood cholesterol level 05/31/2013    Hx of trichomonal vaginitis 02/2006    On Pap smear    Hypertension     Insomnia 05/23/2013    Getting ambien through psych      Iron deficiency anemia 05/24/2013    MS (multiple sclerosis) (CMS-HCC) 11/23/2001    last flare up was 12/2019    Obesity, Class III, BMI 40-49.9 (morbid obesity) (CMS-HCC)     Obstructive sleep apnea 11/01/2011    uses cpap        Past Surgical History:  Past  Surgical History:   Procedure Laterality Date    CATARACT EXTRACTION Bilateral 08/14/2003, 09/09/2003    CESAREAN SECTION  01/12/2000    COSMETIC SURGERY      DEBRIDEMENT LEG Left 09/30/1999    Spider bite    ENDOMETRIAL ABLATION  02/11/2005    HYSTERECTOMY      OOPHORECTOMY      PR LAP, GAST RESTRICT PROC, LONGITUDINAL GASTRECTOMY Midline 02/15/2014    Procedure: ROBOTIC PR LAP, GAST RESTRICT PROC, LONGITUDINAL GASTRECTOMY;  Surgeon: Aida Raider, MD;  Location: MAIN OR [place];  Service: Gastrointestinal    PR UPPER GI ENDOSCOPY,DIAGNOSIS N/A 11/12/2020    Procedure: ESOPHAGOGASTRODUODENOSCOPY;  Surgeon: San Morelle, MD;  Location: ENDO PROCEDURES [place];  Service: Gastroenterology    TONSILLECTOMY  1982    TOTAL ABDOMINAL HYSTERECTOMY      TUBAL LIGATION  01/12/2000       Family History:  Family History   Problem Relation Age of Onset    Lupus Mother     Hypertension Mother     Fibromyalgia Mother     Heart disease Mother         aortic valve replacement    Cataracts Mother     Diabetes Mother     Brain cancer Father         Brain tumor    Hyperlipidemia Father     Leukemia Maternal Aunt     Lupus Maternal Aunt     Lupus Maternal Aunt     Breast cancer Cousin     Cervical cancer Cousin        Social History:  Social History     Tobacco Use    Smoking status: Never    Smokeless tobacco: Never   Substance Use Topics    Alcohol use: No       Allergies:  Cymbalta [duloxetine], Keflex [cephalexin], and Kelp    Current Medications:  Current Outpatient Medications   Medication Sig Dispense Refill    acetaminophen (TYLENOL) 500 MG tablet Take 2 tablets (1,000 mg total) by mouth every eight (8) hours as needed for pain. 30 tablet 0    amlodipine (NORVASC) 5 MG tablet Take 1 tablet (5 mg total) by mouth daily. (Patient taking differently: Take 1 tablet (5 mg total) by mouth daily. Not pick up yet) 90 tablet 3    ascorbic acid, vitamin C, (ASCORBIC ACID) 250 mg Chew Chew.      benzonatate (TESSALON) 100 MG capsule Take 1 capsule (100 mg total) by mouth Three (3) times a day as needed for cough for up to 7 days. 21 capsule 0    calcitonin, salmon, (FORTICAL/MIACALCIN) 200 unit/actuation nasal spray 1 SPRAY INTO ALTERNATING NOSTRILS DAILY. 7.4 mL 0    cholecalciferol, vitamin D3-100 mcg, 4,000 unit,, 100 mcg (4,000 unit) Tab Take 1 tablet (100 mcg total) by mouth in the morning.      diclofenac sodium (VOLTAREN) 1 % gel Apply 2 g topically four (4) times a day. (Patient taking differently: Apply 2 g topically four (4) times a day. Not pick up yet) 100 g 0    ferrous sulfate 325 (65 FE) MG tablet Take 1 tablet (325 mg total) by mouth daily. 90 tablet 3    fluticasone propionate (FLONASE) 50 mcg/actuation nasal spray 2 sprays into each nostril daily. 16 g 6    lisinopril (PRINIVIL,ZESTRIL) 40 MG tablet Take 1 tablet (40 mg total) by mouth daily. 30 tablet 11    modafinil (PROVIGIL) 100 MG  tablet Take 2 tablets (200 mg total) by mouth daily. 120 tablet 5    multivitamin (TAB-A-VITE/THERAGRAN) per tablet Take 1 tablet by mouth daily. 90 tablet 3    oxybutynin (DITROPAN-XL) 5 MG 24 hr tablet Take 1 tablet (5 mg total) by mouth daily. 30 tablet 5    PARoxetine (PAXIL) 30 MG tablet Take 2 tablets (60 mg total) by mouth every evening. 180 tablet 2    predniSONE (DELTASONE) 20 MG tablet Take 2 tablets (40 mg total) by mouth daily for 5 days. 10 tablet 0    pregabalin (LYRICA) 75 MG capsule Take 2 capsules (150 mg total) by mouth two (2) times a day. 120 capsule 5    teriflunomide (AUBAGIO) 14 mg Tab Take 1 tablet (14 mg total) by mouth every evening. 90 tablet 1    tizanidine (ZANAFLEX) 4 MG tablet Take 1 tablet (4 mg total) by mouth nightly. 30 tablet 5     No current facility-administered medications for this visit.       Review of Systems:  Pertinent positive/negative findings related to today's visit are noted in the HPI and Assessment/Plan.  ____________________________________________________________________  Vitals:  Vitals:    08/15/23 1043   Temp: 36.3 ??C (97.3 ??F)   TempSrc: Skin   Weight: (!) 133 kg (293 lb 3.2 oz)   Height: 170.2 cm (5' 7)     Physical Exam:    Constitutional: Well developed, Well nourished, No acute distress and Interactive.  General: Patient is alert and orientated.  Psychological: The patient's mood is normal, and appropriate for the circumstances.    Pain (Neuro/Musculoskeletal) Exam:  Gait: Normal. Able to toe and heel walk. Uses assist device - none  Reflexes: +clonus 2-3 beats left; bilateral +hoffmans    Reflex LEFT RIGHT   Patellar 3+ 3+   Achilles 3+ 3+     Motor  Muscle  LEFT RIGHT    EHL (L5) 5/5 5/5   TA (L4) 5/5 5/5   GS (S1) 5/5 5/5   Quad (L3) 5/5 5/5   HS (L2) 5/5 5/5   IS 5/5 5/5     Appropriate strength without any gross motor deficit appreciated.    Sensory:  Intact to light touch of all four extremities    Special Exams:  No pain over L1 midline  Positive lumbar paraspinal tenderness  bilaterally  Positive pain with lumbar facet loading  bilaterally .  Negative SLR bilaterally  Positive left FABER   ____________________________________________________________________  Controlled Substance Monitoring:  NCPDMP: Reviewed Today  ____________________________________________________________________  Imaging and Diagnostic Studies:  All applicable diagnostic studies related to this consultation have been reviewed.  Relevant diagnostic reports and/or my personal review of imaging or other diagnostic studies listed below:    XR Lumbar Spine 2 or 3 Views  Status: Final result     PACS Images     Show images for XR Lumbar Spine 2 or 3 Views    Impression   L1 anterior vertebral body wedge compression deformity with approximately 30% height loss.     Narrative   EXAM: XR LUMBAR SPINE 2 OR 3 VIEWS   DATE: 04/05/2023 2:40 AM   ACCESSION: [id number]   DICTATED: 04/05/2023 4:18 AM   INTERPRETATION LOCATION: [facility]      CLINICAL INDICATION: 52 years old Female with fall w low back pain        COMPARISON: None.      TECHNIQUE: AP and lateral views of the lumbar spine. 4 images.  FINDINGS:   L1 anterior vertebral body compression deformity with approximately 30% height loss. Normal alignment. Intervertebral disc spaces are maintained. Mild multilevel facet arthropathy.     ____________________________________________________________________  Relevant Labs Reviewed: Below labs reviewed    Lab Results   Component Value Date    WBC 6.2 08/10/2023    HGB 12.3 08/10/2023    HCT 37.1 08/10/2023    MCV 85.3 08/10/2023    PLT 251 08/10/2023       No results found for: BMP, CMP     Lab Results   Component Value Date    CREATININE 1.23 (H) 08/10/2023       No results found for: HGBA1C  ____________________________________________________________________  Diagnosis:   Diagnosis ICD-10-CM Associated Orders   1. Lumbar facet joint syndrome  M47.816 MRI Lumbar Spine Wo Contrast     AMB REFERRAL TO PHYSICAL THERAPY      2. Multiple sclerosis (CMS-HCC)  G35 Ambulatory referral to Spine Center      3. Lumbar spondylosis  M47.816 MRI Lumbar Spine Wo Contrast     AMB REFERRAL TO PHYSICAL THERAPY          Assessment and Plan:  Victoria Kelly is a 52 y.o. old female referred from [place], Clara Josephin* presenting for new patient consultation.    She has a notable hx of MS.     Patient presents with axial low back pain without radicular symptoms, LE weakness, sensory changes, or bowel or bladder changes most consistent with facet mediated joint pain. Recommended she participate in PT for her low back. We discussed her xray findings which show degenerative changes and facet arthropathy. We discussed several medications and decided to continue her current regimen of lyrica and tizanidine. I will have her return in 6-8 weeks after trial of PT and MRI results. Will consider bilateral L4-S1 MBB/RFA.     Interventional treatments:  -None    Medication recommendations:  -Continue current regimen of lyrica and tizanidine     Physical therapy:  -Physical therapy for the above pain concern has been ordered as a part of today's visit.     Imaging/diagnostic studies recommended:  -MRI lumbar spine     Consults/referrals placed:  -None    Follow up recommendation:  -Follow up in 6-8 weeks     Treatment plan fully discussed and agreed upon with the patient. All questions were answered to her satisfaction today.    I have personally spent greater than 45 minutes involved in both face-to-face and non-face-to-face activities for this patient on the date of the visit. Professional time spent includes, but is not limited to, reviewing the medical record, evaluating the patient, independently interpreting results, coordinating care with other providers, and/or time spent personally documenting.I discussed and answered questions regarding the patient's diagnosis and pain concerns. When appropriate and as designated above, I spent time reviewing imaging findings with patient, utilizing anatomic models for education regarding etiology of pain and interventional therapies, and/or discussed the potential risks vs potential benefits of various treatment options for each specific etiology of pain.     Delories Heinz, DO   Interventional Pain/Spine Medicine  Assistant Professor - Physical Medicine & Rehabilitation   [employer] [facility] [employer] [facility] [employer]

## 2023-08-23 MED ORDER — TIZANIDINE 4 MG TABLET
ORAL_TABLET | Freq: Every evening | ORAL | 5 refills | 30 days
Start: 2023-08-23 — End: ?

## 2023-08-24 MED ORDER — PANTOPRAZOLE 40 MG TABLET,DELAYED RELEASE
ORAL_TABLET | Freq: Every day | ORAL | 1 refills | 30 days | Status: CP
Start: 2023-08-24 — End: 2024-08-23

## 2023-08-24 NOTE — Unmapped (Signed)
Sent refill of pantoprazole to pharmacy

## 2023-08-24 NOTE — Unmapped (Signed)
Request received via interface.     Provider: Yolande Jolly, PA    Last Visit Date: 07/22/2023  Next Visit Date: Visit date not found    Lab Results   Component Value Date    Hepatitis C Ab Nonreactive 10/21/2022    HIV Antigen/Antibody Combo Nonreactive 02/10/2012        Results for orders placed during the hospital encounter of 08/21/19    MRI Brain Wo Contrast    Narrative  EXAM: Magnetic resonance imaging, brain, without contrast material.  DATE: 08/21/2019  ACCESSION: [id number] UN  DICTATED: 08/21/2019 1:10 PM  INTERPRETATION LOCATION: [facility]    CLINICAL INDICATION: 52 years old Female with MS ; Multiple sclerosis, monitor  - G35 - Multiple sclerosis (CMS - HCC)    COMPARISON: Today's MRI cervical and thoracic spine without contrast. MRI brain cervical thoracic spine with and without contrast 03/01/2018.    TECHNIQUE: Multiplanar, multisequence MR imaging of the brain was performed without I.V. contrast.    FINDINGS:    There are multiple foci of T2/FLAIR hyperintensity in the infratentorial, juxtacortical and periventricular white matter, which appear unchanged. Some of the lesions demonstrate corresponding T1 black holes consistent with axonal loss. No new lesions.    The optic nerves are normal in appearance.    Mild diffuse brain volume loss. Ventricles are normal in size. There is no evidence of intracranial hemorrhage, acute infarct, or mass. Sequela of bilateral cataract surgery.    Impression  Multiple white matter lesions in a distribution consistent with multiple sclerosis, unchanged.      Results for orders placed during the hospital encounter of 08/21/19    MRI Thoracic Spine Wo Contrast    Narrative  EXAM: Magnetic resonance imaging, spinal canal and contents, thoracic, without contrast material.  DATE: 08/21/2019 1:19 PM  ACCESSION: [id number] UN  DICTATED: 08/21/2019 2:22 PM  INTERPRETATION LOCATION: [facility]    CLINICAL INDICATION: 52 years old Female with MS  - G35 - Multiple sclerosis (CMS - HCC)    COMPARISON: 03/01/2018 MRI thoracic spine and prior.    TECHNIQUE: Multiplanar MRI was performed through the thoracic spine without contrast administration    FINDINGS:  The T1 level is outside field of view.    Diffusely T1 hypointense appearance of the bone marrow is nonspecific. Redemonstration of Modic type III endplate degenerative changes along the anterior disc space at the T11-12 level.    Small multilevel posterior disc bulges. No significant spinal canal or neural foraminal narrowing.    The vertebral bodies are normally aligned. Disc spaces are preserved.    Normal caliber thoracic cord. 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 No focal cord lesions.    Similar appearance of 2.6 cm left adrenal nodule (20:25).    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 No cord lesions.    2.7 cm left adrenal nodule, unchanged.      Results for orders placed during the hospital encounter of 08/21/19    MRI Cervical Spine Wo Contrast    Narrative  EXAM: Magnetic resonance imaging, spinal canal and contents, cervical without contrast material.  DATE: 08/21/2019 1:19 PM  ACCESSION: [id number] UN  DICTATED: 08/21/2019 1:56 PM  INTERPRETATION LOCATION: [facility]    CLINICAL INDICATION: 52 years old Female with MS  - G35 - Multiple sclerosis (CMS - HCC)    COMPARISON: 03/01/2018 MRI cervical spine and prior.    TECHNIQUE: Multiplanar multisequence MRI was performed through the cervical spine without intravenous contrast.  FINDINGS:  Image quality is degraded by motion artifact.    Normal alignment with slight reversal of cervical lordosis. Vertebral body heights are preserved. Multilevel endplate degenerative spurring, desiccation and mild disc space loss most prominent at the C4-5 level.    Redemonstration of multilevel degenerative changes most notable at C4-C5 where there is a central disc protrusion which has mildly increased, resulting in mild to moderate spinal canal narrowing. No significant spinal canal or neural foraminal narrowing at the remaining cervical levels.    Normal caliber cervical cord. T2 hyperintense foci within the right lateral cord at the C2 and C3 level are unchanged (18:6, 11). No new lesions.    Impression  Right lateral cord lesion at the C3 level is unchanged. No new lesions.    Mildly increased disc protrusion at C4-C5 resulting mild to moderate spinal canal narrowing.

## 2023-08-25 MED ORDER — TIZANIDINE 4 MG TABLET
ORAL_TABLET | Freq: Every evening | ORAL | 5 refills | 30 days | Status: CP
Start: 2023-08-25 — End: ?

## 2023-08-27 MED ORDER — PREGABALIN 75 MG CAPSULE
ORAL_CAPSULE | Freq: Two times a day (BID) | ORAL | 0 refills | 0 days
Start: 2023-08-27 — End: ?

## 2023-08-28 MED ORDER — PREGABALIN 75 MG CAPSULE
ORAL_CAPSULE | Freq: Two times a day (BID) | ORAL | 1 refills | 90 days | Status: CP
Start: 2023-08-28 — End: ?

## 2023-09-01 NOTE — Unmapped (Signed)
[place] [facility]-- Medication Adherence  CHART REVIEW    To patients reading this note:  Based on information provided by your insurance, the primary purpose of this chart review is to determine the need and/or appropriateness of statin therapy. Any suggested changes are intended to enhance the overall care you receive.      Population:  [employer]    A chart review was performed based on patient's history of diabetes  and third party payer claims data.     Based on chart review, the patient meets the following exclusion criteria:   Patient does not have diabetes. Last A1C 5.5.         Outreached provider to evaluate if exclusion appropriate and document the appropriate billing code, as applicable.         Ruthine Dose, PharmD , PharmD  [place] [facility] ([facility])

## 2023-09-02 NOTE — Unmapped (Unsigned)
[facility] REHAB THERAPIES PT [address] [place]  OUTPATIENT PHYSICAL THERAPY  09/02/2023          Patient Name: Victoria Kelly  Date of Birth:07/11/1971  Date: 09/02/2023  Session Number:  1  Therapy Diagnosis: No diagnosis found.  Referring Pracitioner: Roma Schanz  Occurrence Codes:  Onset of Injury: ***  Date of Evaluation: 09/02/2023  Certification Dates: ***    Primary Therapist: Norvel Richards, PT, DPT, CLT        Contraindications:  {lymph contraindications:52738::none}  Precautions:  {lymph precautions:52739}  Red Flags:  {Red Flags:7052677849}  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    Generalized anxiety disorder 02/10/2012    H/O bariatric surgery     High blood cholesterol level 05/31/2013    Hx of trichomonal vaginitis 02/2006    On Pap smear    Hypertension     Insomnia 05/23/2013    Getting ambien through psych      Iron deficiency anemia 05/24/2013    MS (multiple sclerosis) (CMS-HCC) 11/23/2001    last flare up was 12/2019    Obesity, Class III, BMI 40-49.9 (morbid obesity) (CMS-HCC)     Obstructive sleep apnea 11/01/2011    uses cpap      Oncology History/Surgical History:  Hematology/Oncology History    No history exists.     Past Surgical History:   Procedure Laterality Date    CATARACT EXTRACTION Bilateral 08/14/2003, 09/09/2003    CESAREAN SECTION  01/12/2000    COSMETIC SURGERY      DEBRIDEMENT LEG Left 09/30/1999    Spider bite    ENDOMETRIAL ABLATION  02/11/2005    HYSTERECTOMY      OOPHORECTOMY      PR LAP, GAST RESTRICT PROC, LONGITUDINAL GASTRECTOMY Midline 02/15/2014    Procedure: ROBOTIC PR LAP, GAST RESTRICT PROC, LONGITUDINAL GASTRECTOMY;  Surgeon: Aida Raider, MD;  Location: MAIN OR [place];  Service: Gastrointestinal    PR UPPER GI ENDOSCOPY,DIAGNOSIS N/A 11/12/2020    Procedure: ESOPHAGOGASTRODUODENOSCOPY;  Surgeon: San Morelle, MD;  Location: ENDO PROCEDURES [place];  Service: Gastroenterology    TONSILLECTOMY  1982    TOTAL ABDOMINAL HYSTERECTOMY      TUBAL LIGATION  01/12/2000     Metastatic cancer:   Location: ***  Weight bearing status: ***        ASSESSMENT/Reason for Referral:   52 y.o. female presents with Stage *** lymphedema secondary *** .  In addition to lymphedema , the patient is experiencing these side effects from cancer treatments: {rehab chemo side effects:63630}  Currently the {Right/left:16020} limb is *** % bigger than the {Right/left:16020} limb. Pt will benefit from lymphedema therapy to address : education in self care, precautions, and management of symptoms, edema, soft tissue changes,  fitting of appropriate compression garments, and progression of  exercises to return to PLOF.    Recommendations for treatment/garments:  1) Patient will obtain proper garments to address swelling and improve tissue integrity. Garment recommendations :***  2) Pt will be seen clinically for rehabilitation to include treatments to address as needed: edema/lymphedema, pain, ROM, strength, balance and endurance deficits, as well as learning self care strategies to address these deficits  3) Pt will require a pneumatic compression pump because despite conservative treatment of:{oncology rehab pump justification:63907},the patient exhibits the following symptoms:{oncology rehab pump symptoms:63908}  Patient requires skilled Physical Therapy services  for the following problem list and secondary functional limitations:    Impairments/ Problem List:   {Oncology Problem List:63656}    Secondary Functional Limitations:  {oncology secondary limitations:63666}    Patient Goals: {lymph patient ZOXWR:[id number]}    Physical Therapy Goals:   In *** weeks: Pt and/or caregiver will:  {oncology rehab LE initial STG goals:77917}    In *** weeks: Pt and/or caregiver will:  {oncology rehab initial LE LTG:77918}    Prognosis for goal achievement: {REHAB Prognosis:[phone]} due to {Prognosis [phone]}.    PLAN:   Within a 90 day certification period, schedule permitting, decreasing frequency as able, patient will participate in the following as needed: Manual lymphatic drainage, compression bandaging or use of reduction kits, appropriate garment recommendations, skin care, therapeutic exercise, manual therapy, self care, therapeutic activity orthotic fitting, low level laser therapy, lymphatouch, taping, ROM, strength training, education on posture and body mechanics,pump/equipment recommendations, balance exercises,endurance exercises, and strategies to assist with sensation deficits and pain reduction. .      Planned frequency and duration  of treatment:   *** x week x *** weeks    Next Visit Plan: ***  {oncology rehab daily ZHYQ:65784}    SUBJECTIVE:    Patient???s communication preference: verbal, written, visual, prn     History of Present Illness/ Pt reports:  Pt presents to physical therapy ***  Location of pain: {Right/left:16020} {lymph pain loci:54092}  Current Pain:  ***/10   Max pain:  ***/10  Least pain:  ***/10  Nature of pain: {nature of ONGE:9528413244}    Cognitive Changes? {yes no:314532}   Barriers to learning:    {oncology cognitive changes:80812}    Difficulty coping with diagnosis, body image/financial concerns?  Informed patient of CCSP, pt would like referral:{yes no:314532}       Home environment:     Home: ***  Pt has caregiver available, capable and willing to help:{yes no:314532}   Social History     Tobacco Use    Smoking status: Never    Smokeless tobacco: Never   Substance Use Topics    Alcohol use: No     Occupation/ Activities/Recreation:        Occupational History    Occupation: Personal assistant: unknown     Exercise: ***  Prior Functional Status:   Independent  ***  Current Functional Limitations:   ***  Self- Reported Outcome Measure:  The patient reports the following limitations based on Lymphedema Life impact scale scoring *** with percent impairment of  ***.    Sexual Dysfunction or Bowel or Bladder  GI changes : {lymph b&b/sexual dysfunction:52741}  Informed pt of  pelvic health therapy.  Pt would like referral? {yes no:314532}     History of  skin/cellulitis infections?: {yes no:314532}  Hospitalizations due to infections?{yes no:314532}    Previous Lymphedema Treatment Type:  {oncology rehab previous lymph Rx:63905}    Current management:   {oncology rehab current management:63906}    OBJECTIVE:      Posture/Observations:  {Standing Posture/Observation:52770}      LE Range of Motion/Flexibilty:   Date: *** ***          Right Left RIght Left Right Left RIght Left   Generally all over *** ***                    Hip flexion           Hip Internal rotation  Hip external rotation           Knee flexion           Knee extension           DF           PF                      UE ROM WFL WFL               LE Strength/MMT:   Date: *** ***          Right Left RIght Left Right Left RIght Left   Generally all over *** ***                    Hip flexion           Hip extension           Hip abduction           Knee flexion           Knee extension           DF           PF                      UE STRENGTH for donning garments [facility] WFL               Girth Measurements: taken in cm    Date: *** ***            Right Left RIght Left Right Left RIght Left RIght Left   MTP *** ***           Mid foot *** ***                                     Axilla             Mid breast nipple line             Ribs below breast             Natural Waistline             Umbilicus             Hips at ___ cm below umbilicus               Limb Volume  ***                     Location of swelling:  {Right/left:16020} {location:52769}    Skin condition:Stemmer???s sign:{yes no:314532}.  {lymph skin:51732}  Incision/Scar:   {onco scar and myofascial:77787}    Sensation :  Light touch:   Decreased sensation around scar  Peripheral neuropathy location: ***  Proprioception:  {oncology rehab proprioception:77620}  Vibration:   {oncology rehab proprioception:77620}    Gait:  {Gait [phone]}  Balance:   {onco balance tests:80814}  Further balance testing is recommended    Endurance/Fatigue:   {oncology fatigue:80815}  Further cardiovascular fitness testing is recommended    DME needs related to safety:***    Total Treatment Time: *** min      PT Evaluation: *** min  {Evaluation grading:77789}  4+ systems evaluated: integumentary, circulatory/lymphatic, neurological, musculoskeletal, functional mobility  Data from the patient???s history and examination indicates the presence of { # personal factors/comorbidities:50651} that  will impact the plan of care for the current problem, including {crc lymph eval comorbidity list:52745} Examination of body systems includes {# body structures:50652} body structures, functions, activity limitations and/or participation restrictions including {crc lymph eval body systems:52747}. The assessed clinical presentation is {stability:50653} based on ***. These factors result in the need for {complexity level:50654} clinical decision making.    Treatment Rendered:    Patient and/or caregiver and therapist were mask compliant per current [place] COVID mask policy during the entire treatment session.      Self Care x *** min   Patient Education:Pt and/or caregiver were educated regarding some or all of the following prn:  Lymphedema/edema etiology,Lymphedema/edema precautions, Indications/Contraindications to treatment, importance of therapy,treatment options and plan, self care management  to include skin care, self MLD, garment/equipment options and HEP, and community resources.  Patient and/or caregiver demonstrated and verbalized agreement and understanding. Given written information as needed.    Manual x *** min   {oncology rehab manual:63903}    Therapeutic Exercise x *** min  {oncology rehab ther ex:63904}    Therapeutic Activity x *** min  {oncology rehab therapeutic activity:63893}    Orthotic fit /training x *** min  {oncology rehab orthotic/prosthetic :63894}    Neuromuscular Re-education  x *** min  {oncology rehab neuromuscular re-ed:77835}      Today's Charges (noted here with $$):                        Equipment provided/recommended:   {equip provided:52778}    Communication/consultation with other professionals:  {oncology rehab communication:63697}    Referrals made to the following providers:  {oncology rehab referrals:63698}    Medical Necessity: This treatment is medically necessary throughout the course of this patient's life during and after cancer treatments to improve functional activities and /or to minimize the decline of functional abilities and worsening of symptoms,including infection and recurrent hospitalizations, through independent/home management strategies.  Lymphedema, a chronic, progressive condition for which there is no cure, is marked by the accumulation of protein-rich fluid in one or more quadrants of the body due to primary or secondary disruption of the lymphatic system.  The sustained accumulation results in tissue inflammation, an increase in fatty tissue, and development of obstructive connective tissue.  These changes may result in an increased risk of infection, disfigurement and a decrease in mobility and functional performance.   Pt will benefit from physical therapy by a certified lymphedema therapist to address : education in self care, precautions, and management of symptoms, optimal edema reduction/maintenance of girth, optimal soft tissue changes, fitting of appropriate compression garments, and progression of exercises to optimize functional ROM and strength, balance, and endurance in all ADL's(home, work, recreational, community).  Attendance Policy:  I reviewed the no-show/attendance policy with the patient and caregiver(s). The patient/family is aware that they must call to cancel appointments more than 24 hours in advance. They are also aware that if they late cancel or no-show three times, we reserve the right to cancel their remaining appointments. This policy is in place to allow [place] to best serve the needs of our caseload.     I attest that I have reviewed the above information.  Signed: Norvel Richards, PT   09/02/2023 8:11 AM

## 2023-09-02 NOTE — Unmapped (Signed)
[facility]  [address]  [place], [place] [address]    [phone]    Lasandra Beech Ferriss did not show for her  scheduled physical therapy  Evaluation.  Please contact me if you have any questions or concerns.    Thank you for this referral,                Signed: Norvel Richards, PT  09/02/2023 1:18 PM

## 2023-09-05 NOTE — Unmapped (Signed)
Internal Medicine ([facility])  09/06/2023 at 2:30 PM

## 2023-09-06 ENCOUNTER — Ambulatory Visit: Admit: 2023-09-06 | Discharge: 2023-09-07 | Payer: MEDICARE

## 2023-09-06 DIAGNOSIS — R600 Localized edema: Principal | ICD-10-CM

## 2023-09-06 DIAGNOSIS — I10 Essential (primary) hypertension: Principal | ICD-10-CM

## 2023-09-06 DIAGNOSIS — R059 Cough, unspecified type: Principal | ICD-10-CM

## 2023-09-06 LAB — PRO-BNP: PRO-BNP: 158 pg/mL (ref <=300.0)

## 2023-09-06 MED ORDER — OLMESARTAN 40 MG TABLET
ORAL_TABLET | Freq: Every day | ORAL | 0 refills | 30 days | Status: CP
Start: 2023-09-06 — End: 2023-10-06

## 2023-09-06 NOTE — Unmapped (Addendum)
Dear Victoria Kelly,    It was a pleasure to see you in clinic today. During your visit, we discussed that your cough may be due to multiple causes such as allergies, post-viral cough, GERD, and/or lisinopril side effect. We ordered a blood test to check for heart failure as well.     Please stop taking Lisinopril and start taking Olmesartan (Benicar).    I also recommend that you use Flonase twice per day and Zyrtec daily for your allergies.    If you have MyChart, I will follow-up with the results through MyChart. If not, I will call you directly with the results.     Please let me know if your symptoms worsen or do not improve over the next week.    Mahalia Longest, DO

## 2023-09-06 NOTE — Unmapped (Signed)
[facility] SAME DAY [facility]IN[facility] NOTE     09/06/2023    PCP: Durene Romans, MD     Assessment and Plan    Victoria Kelly was seen today for cough.    Diagnoses and all orders for this visit:    Cough, unspecified type  -     olmesartan (BENICAR) 40 MG tablet; Take 1 tablet (40 mg total) by mouth daily.  -     Pro-BNP; Future  -     Pro-BNP    Bilateral lower extremity edema  -     olmesartan (BENICAR) 40 MG tablet; Take 1 tablet (40 mg total) by mouth daily.  -     Pro-BNP; Future  -     Pro-BNP    Hypertension, unspecified type  -     olmesartan (BENICAR) 40 MG tablet; Take 1 tablet (40 mg total) by mouth daily.  -     Pro-BNP; Future  -     Pro-BNP        52 year old female with history of MS, GERD, depression, HTN, HLD, IDA, OSA, stage III [name] here with chronic cough. Differential is broad and includes post-viral cough, medication-induced cough, allergies, GERD, pulmonary edema, CHF, among others. Will obtain pro-BNP to evaluate for heart failure given lower extremity edema and family history of heart failure though lung exam is reassuring. If pro-BNP elevated, would recommend echo for further evaluation. Also recommend patient discontinue lisinopril and start olmesartan instead since cough may be related to lisiniopril. Finally, I recommend patient increase Flonase to twice daily and start using Zyrtec for her allergies. I also encouraged her to follow-up with her PCP in one month to monitor symptoms.    Follow up as scheduled or sooner as needed.    Patient was seen and discussed with Dr. Altamease Oiler who is in agreement with the assessment and plan as outlined above.       Subjective    Problem List:  Patient Active Problem List   Diagnosis    Multiple sclerosis (CMS-HCC)    Dysthymic disorder    Obesity    Obstructive sleep apnea    Weakness of wrist    Ankle arthritis    Allergic rhinitis    Insomnia    Low back pain    Iron deficiency anemia    Acne    H/O cataract removal with insertion of prosthetic lens    Hyperlipidemia    History of bariatric surgery    Elevated serum creatinine    Morbid (severe) obesity due to excess calories (CMS-HCC)    S/P TAH (total abdominal hysterectomy)    Rash    Well woman exam with routine gynecological exam    Stage 3a chronic kidney disease (CMS-HCC)       HPI  Victoria Kelly is a 52 y.o. year old female with the above problem list who presents to the same day clinic for   Chief Complaint   Patient presents with    Cough     She has had this cough for over 1 month now. She was seen in the ED on 08/10/23 and prescribed steroid taper and Tessalon perles for presumed URI. She states her cough has not improved. The cough is usually dry but occasionally productive of sputum. She has also had lower extremity edema over the last few months. She also has chronic allergies. She uses Flonase daily. She denies fever, chills, hemoptysis, nausea, vomiting, or diarrhea. No tobacco use history. 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    Meds and allergies were reviewed in Epic    ROS: 10 point ROS was performed and is otherwise negative other than mentioned in the HPI    Objective  PE:  Vitals:    09/06/23 1431   BP: 127/78   Pulse: 64   Resp: 11   Temp: 36.5 ??C (97.7 ??F)   SpO2: 98%     General: well-appearing in NAD  ENT: OP clear w/o erythema or exudate. Left TM with air fluid level present. Pale boggy nasal mucosa  CV: regular, no murmurs  Resp: normal WOB, scattered expiratory wheezing in left side  GI: soft, NTND  Skin: clean and dry, no rashes or lesions noted  Ext: no cyanosis/clubbing, 1+ pitting edema bilaterally  Neuro: alert, follows commands. CN II-XII grossly intact    _______________________________________________________________________________________      Same Day Metric Tracker:    Same Day Metric Tracker:  Referral source for today's visit:   Self    Referral to other outpatient urgent services? N/A    Did today's visit result in referral to ED or direct admission? No

## 2023-09-07 NOTE — Unmapped (Signed)
[facility] Pharm[facility] Refill Coordination Note    Specialty Medication(s) to be Shipped:   Neurology: teriflunomide    Other medication(s) to be shipped: No additional medications requested for fill at this time     Victoria Kelly, DOB: 12/15/1970  Phone: [phone] (home) [phone] (work)      All above HIPAA information was verified with patient.     Was a Nurse, learning disability used for this call? No    Completed refill call assessment today to schedule patient's medication shipment from the [facility]  [phone]).  All relevant notes have been reviewed.     Specialty medication(s) and dose(s) confirmed: Regimen is correct and unchanged.   Changes to medications: Karmyn reports no changes at this time.  Changes to insurance: No  New side effects reported not previously addressed with a pharmacist or physician: None reported  Questions for the pharmacist: No    Confirmed patient received a Conservation officer, historic buildings and a Surveyor, mining with first shipment. The patient will receive a drug information handout for each medication shipped and additional FDA Medication Guides as required.       DISEASE/MEDICATION-SPECIFIC INFORMATION        N/A    SPECIALTY MEDICATION ADHERENCE     Medication Adherence    Patient reported X missed doses in the last month: 0  Specialty Medication: teriflunomide 14 mg Tab (AUBAGIO)  Patient is on additional specialty medications: No  Patient is on more than two specialty medications: No  Any gaps in refill history greater than 2 weeks in the last 3 months: no  Demonstrates understanding of importance of adherence: yes   Other adherence tool: Patient has a bag of medicines that she takes before bed; has routine   Support network for adherence: family member              Were doses missed due to medication being on hold? No     teriflunomide 14 mg Tab (AUBAGIO)    : 7  days of medicine on hand        REFERRAL TO PHARMACIST Referral to the pharmacist: Not needed      [facility]     Shipping address confirmed in Epic.       Delivery Scheduled: Yes, Expected medication delivery date: 09/13/23 .     Medication will be delivered via Same Day Courier to the prescription address in Epic WAM.    Ricci Barker   [facility]  Specialty Technician

## 2023-09-08 NOTE — Unmapped (Unsigned)
[facility] [address] [place]  OUTPATIENT PHYSICAL THERAPY  09/09/2023          Patient Name: Victoria Kelly  Date of Birth:[birth year]  Date: 09/09/2023  Session Number:  1  Therapy Diagnosis: No diagnosis found.  Referring Pracitioner: Roma Schanz  Occurrence Codes:  Onset of Injury: ***  Date of Evaluation: 09/02/2023  Certification Dates: ***    Primary Therapist: Norvel Richards, PT, DPT, CLT        Contraindications:  {lymph contraindications:52738::none}  Precautions:  {lymph precautions:52739}  Red Flags:  {Red [phone]}  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    Generalized anxiety disorder 02/10/2012    H/O bariatric surgery     High blood cholesterol level 05/31/2013    Hx of trichomonal vaginitis 02/2006    On Pap smear    Hypertension     Insomnia 05/23/2013    Getting ambien through psych      Iron deficiency anemia 05/24/2013    MS (multiple sclerosis) (CMS-HCC) 11/23/2001    last flare up was 12/2019    Obesity, Class III, BMI 40-49.9 (morbid obesity) (CMS-HCC)     Obstructive sleep apnea 11/01/2011    uses cpap      Oncology History/Surgical History:  Hematology/Oncology History    No history exists.     Past Surgical History:   Procedure Laterality Date    CATARACT EXTRACTION Bilateral 08/14/2003, 09/09/2003    CESAREAN SECTION  01/12/2000    COSMETIC SURGERY      DEBRIDEMENT LEG Left 09/30/1999    Spider bite    ENDOMETRIAL ABLATION  02/11/2005    HYSTERECTOMY      OOPHORECTOMY      PR LAP, GAST RESTRICT PROC, LONGITUDINAL GASTRECTOMY Midline 02/15/2014    Procedure: ROBOTIC PR LAP, GAST RESTRICT PROC, LONGITUDINAL GASTRECTOMY;  Surgeon: Aida Raider, MD;  Location: MAIN OR [place];  Service: Gastrointestinal    PR UPPER GI ENDOSCOPY,DIAGNOSIS N/A 11/12/2020    Procedure: ESOPHAGOGASTRODUODENOSCOPY;  Surgeon: San Morelle, MD;  Location: ENDO PROCEDURES ;  Service: Gastroenterology    TONSILLECTOMY  1982    TOTAL ABDOMINAL HYSTERECTOMY      TUBAL LIGATION  01/12/2000     Metastatic cancer:   Location: ***  Weight bearing status: ***        ASSESSMENT/Reason for Referral:   52 y.o. female presents with Stage *** lymphedema secondary *** .  In addition to lymphedema , the patient is experiencing these side effects from cancer treatments: {rehab chemo side effects:63630}  Currently the {Right/left:16020} limb is *** % bigger than the {Right/left:16020} limb. Pt will benefit from lymphedema therapy to address : education in self care, precautions, and management of symptoms, edema, soft tissue changes,  fitting of appropriate compression garments, and progression of  exercises to return to PLOF.    Recommendations for treatment/garments:  1) Patient will obtain proper garments to address swelling and improve tissue integrity. Garment recommendations :***  2) Pt will be seen clinically for rehabilitation to include treatments to address as needed: edema/lymphedema, pain, ROM, strength, balance and endurance deficits, as well as learning self care strategies to address these deficits  3) Pt will require a pneumatic compression pump because despite conservative treatment of:{oncology rehab pump justification:63907},the patient exhibits the following symptoms:{oncology rehab pump symptoms:63908}  Patient requires skilled Physical Therapy services  for the following problem list and secondary functional limitations:    Impairments/ Problem List:   {Oncology Problem List:63656}    Secondary Functional Limitations:  {oncology secondary limitations:63666}    Patient Goals: {lymph patient MVHQI:[id number]}    Physical Therapy Goals:   In *** weeks: Pt and/or caregiver will:  {oncology rehab LE initial STG goals:77917}    In *** weeks: Pt and/or caregiver will:  {oncology rehab initial LE LTG:77918}    Prognosis for goal achievement: {REHAB Prognosis:[phone]} due to {Prognosis [phone]}.    PLAN:   Within a 90 day certification period, schedule permitting, decreasing frequency as able, patient will participate in the following as needed: Manual lymphatic drainage, compression bandaging or use of reduction kits, appropriate garment recommendations, skin care, therapeutic exercise, manual therapy, self care, therapeutic activity orthotic fitting, low level laser therapy, lymphatouch, taping, ROM, strength training, education on posture and body mechanics,pump/equipment recommendations, balance exercises,endurance exercises, and strategies to assist with sensation deficits and pain reduction. .      Planned frequency and duration  of treatment:   *** x week x *** weeks    Next Visit Plan: ***  {oncology rehab daily ZDGU:44034}    SUBJECTIVE:    Patient???s communication preference: verbal, written, visual, prn     History of Present Illness/ Pt reports:  Pt presents to physical therapy ***  Location of pain: {Right/left:16020} {lymph pain loci:54092}  Current Pain:  ***/10   Max pain:  ***/10  Least pain:  ***/10  Nature of pain: {nature of VQQV:[id number]}    Cognitive Changes? {yes no:314532}   Barriers to learning:    {oncology cognitive changes:80812}    Difficulty coping with diagnosis, body image/financial concerns?  Informed patient of CCSP, pt would like referral:{yes no:314532}       Home environment:     Home: ***  Pt has caregiver available, capable and willing to help:{yes no:314532}   Social History     Tobacco Use    Smoking status: Never    Smokeless tobacco: Never   Substance Use Topics    Alcohol use: No     Occupation/ Activities/Recreation:        Occupational History    Occupation: Personal assistant: unknown     Exercise: ***  Prior Functional Status:   Independent  ***  Current Functional Limitations:   ***  Self- Reported Outcome Measure:  The patient reports the following limitations based on Lymphedema Life impact scale scoring *** with percent impairment of  ***.    Sexual Dysfunction or Bowel or Bladder  GI changes : {lymph b&b/sexual dysfunction:52741}  Informed pt of  pelvic health therapy.  Pt would like referral? {yes no:314532}     History of  skin/cellulitis infections?: {yes no:314532}  Hospitalizations due to infections?{yes no:314532}    Previous Lymphedema Treatment Type:  {oncology rehab previous lymph Rx:63905}    Current management:   {oncology rehab current management:63906}    OBJECTIVE:      Posture/Observations:  {Standing Posture/Observation:52770}      LE Range of Motion/Flexibilty:   Date: *** ***          Right Left RIght Left Right Left RIght Left   Generally all over *** ***                    Hip flexion           Hip Internal rotation  Hip external rotation           Knee flexion           Knee extension           DF           PF                      UE ROM WFL WFL               LE Strength/MMT:   Date: *** ***          Right Left RIght Left Right Left RIght Left   Generally all over *** ***                    Hip flexion           Hip extension           Hip abduction           Knee flexion           Knee extension           DF           PF                      UE STRENGTH for donning garments [facility] WFL               Girth Measurements: taken in cm    Date: *** ***            Right Left RIght Left Right Left RIght Left RIght Left   MTP *** ***           Mid foot *** ***                                     Axilla             Mid breast nipple line             Ribs below breast             Natural Waistline             Umbilicus             Hips at ___ cm below umbilicus               Limb Volume  ***                     Location of swelling:  {Right/left:16020} {location:52769}    Skin condition:Stemmer???s sign:{yes no:314532}.  {lymph skin:51732}  Incision/Scar:   {onco scar and myofascial:77787}    Sensation :  Light touch:   Decreased sensation around scar  Peripheral neuropathy location: ***  Proprioception:  {oncology rehab proprioception:77620}  Vibration:   {oncology rehab proprioception:77620}    Gait:  {Gait Analysis:[phone]}  Balance:   {onco balance tests:80814}  Further balance testing is recommended    Endurance/Fatigue:   {oncology fatigue:80815}  Further cardiovascular fitness testing is recommended    DME needs related to safety:***    Total Treatment Time: *** min      PT Evaluation: *** min  {Evaluation grading:77789}  4+ systems evaluated: integumentary, circulatory/lymphatic, neurological, musculoskeletal, functional mobility  Data from the patient???s history and examination indicates the presence of { # personal factors/comorbidities:50651} that  will impact the plan of care for the current problem, including {crc lymph eval comorbidity list:52745} Examination of body systems includes {# body structures:50652} body structures, functions, activity limitations and/or participation restrictions including {crc lymph eval body systems:52747}. The assessed clinical presentation is {stability:50653} based on ***. These factors result in the need for {complexity level:50654} clinical decision making.    Treatment Rendered:    Patient and/or caregiver and therapist were mask compliant per current [place] COVID mask policy during the entire treatment session.      Self Care x *** min   Patient Education:Pt and/or caregiver were educated regarding some or all of the following prn:  Lymphedema/edema etiology,Lymphedema/edema precautions, Indications/Contraindications to treatment, importance of therapy,treatment options and plan, self care management  to include skin care, self MLD, garment/equipment options and HEP, and community resources.  Patient and/or caregiver demonstrated and verbalized agreement and understanding. Given written information as needed.    Manual x *** min   {oncology rehab manual:63903}    Therapeutic Exercise x *** min  {oncology rehab ther ex:63904}    Therapeutic Activity x *** min  {oncology rehab therapeutic activity:63893}    Orthotic fit /training x *** min  {oncology rehab orthotic/prosthetic :63894}    Neuromuscular Re-education  x *** min  {oncology rehab neuromuscular re-ed:77835}      Today's Charges (noted here with $$):                        Equipment provided/recommended:   {equip provided:52778}    Communication/consultation with other professionals:  {oncology rehab communication:63697}    Referrals made to the following providers:  {oncology rehab referrals:63698}    Medical Necessity: This treatment is medically necessary throughout the course of this patient's life during and after cancer treatments to improve functional activities and /or to minimize the decline of functional abilities and worsening of symptoms,including infection and recurrent hospitalizations, through independent/home management strategies.  Lymphedema, a chronic, progressive condition for which there is no cure, is marked by the accumulation of protein-rich fluid in one or more quadrants of the body due to primary or secondary disruption of the lymphatic system.  The sustained accumulation results in tissue inflammation, an increase in fatty tissue, and development of obstructive connective tissue.  These changes may result in an increased risk of infection, disfigurement and a decrease in mobility and functional performance.   Pt will benefit from physical therapy by a certified lymphedema therapist to address : education in self care, precautions, and management of symptoms, optimal edema reduction/maintenance of girth, optimal soft tissue changes, fitting of appropriate compression garments, and progression of exercises to optimize functional ROM and strength, balance, and endurance in all ADL's(home, work, recreational, community).  Attendance Policy:  I reviewed the no-show/attendance policy with the patient and caregiver(s). The patient/family is aware that they must call to cancel appointments more than 24 hours in advance. They are also aware that if they late cancel or no-show three times, we reserve the right to cancel their remaining appointments. This policy is in place to allow [place] to best serve the needs of our caseload.     I attest that I have reviewed the above information.  Signed: Norvel Richards, PT   09/09/2023 3:19 PM

## 2023-09-09 ENCOUNTER — Ambulatory Visit: Admit: 2023-09-09 | Payer: MEDICARE

## 2023-09-09 NOTE — Unmapped (Signed)
[facility]  [address]  [place], [place] [address]    [phone]    Victoria Kelly did not show for her  scheduled physical therapy  Evaluation.  Please contact me if you have any questions or concerns.    Thank you for this referral,                Signed: Norvel Richards, PT  09/09/2023 3:46 PM

## 2023-09-13 MED FILL — TERIFLUNOMIDE 14 MG TABLET: ORAL | 30 days supply | Qty: 30 | Fill #1

## 2023-09-14 NOTE — Unmapped (Signed)
Addended by: Floreen Comber E on: 09/14/2023 10:05 AM     Modules accepted: Level of Service

## 2023-09-17 MED ORDER — PANTOPRAZOLE 40 MG TABLET,DELAYED RELEASE
ORAL_TABLET | Freq: Every day | ORAL | 1 refills | 0 days
Start: 2023-09-17 — End: ?

## 2023-09-20 MED ORDER — PANTOPRAZOLE 40 MG TABLET,DELAYED RELEASE
ORAL_TABLET | Freq: Every day | ORAL | 3 refills | 90 days | Status: CP
Start: 2023-09-20 — End: 2024-09-14

## 2023-09-20 NOTE — Unmapped (Signed)
Per interface/mychart refill request.  Patient last seen by PCP on 07/11/2023  Rx sent to requested pharmacy.

## 2023-09-21 NOTE — Unmapped (Unsigned)
Internal Medicine Clinic Visit    Reason for visit: Leg swelling    A/P:    {TIP - HCC- RAFF Pilot- Clinical Documentation Specialist Recommendations-  No specialty comments available.   This text will self delete upon signing note:75688}     No diagnosis found.          HTN  Increased dose of lisinopril at last visit. Also noted to have headache at that visit that she associated with stress as well as vision changes that she has associated with cataracts. Stress and headaches have since improved after moving out of her mother's house. Taking BP at home with values noted in the 120s/60s.   - continue lisinopril 40 mg daily  - encouraged to continue taking BP values at least daily for the next 1-2 weeks  - can consider additional HTN meds at nephrology appt if home BP values remain elevated (has previously developed AKI on hydrochlorothiazide, could consider amlodipine)     Anxiety/Depression - Stress  Previously followed with psychiatry.  Currently on paroxetine 60mg  daily. Noted significant stress at last appt associated with living with her mother. Has since moved out of her mother's house and now lives with a cousin. Significant subjective improvement in stress levels. PHQ9 much improved, denies SI/HI. [name]7 remains with some mild anxiety.  - Offered referral to counseling, pt defers at this time  - continue paroxetine 60 mg daily    [name]  Recent mechanical fall in the shower for which she was seen in the ED in June 2024. Found to have an [name]. Vit D levels slightly low at that time, now improved with re-addition of vitamin D supplements. Pain remains present, but improved.  - DEXA done June 2024 with low bone mass  - will eConsult endocrinology for recommendations re: pharmacotherapy in the setting of osteopenia, [name] in a patient with a history of MS and bariatric surgery  - continue tylenol   - continue tizanidine prn  - continue calcitonin nasal spray  - add voltaren gel    Leg Swelling (improved)  Was seen in clinic several times for R>L swelling. Initial exam with BL chronic venous stasis changes. Exam not consistent with cellulitis. PVL negative for DVT. Seems to have improved with elevation as well as 1 week trial of lasix. Suspect her swelling may be multifactorial in the setting of venous stasis, [name], and possible component of lymphedema.   - lymphedema clinic appt scheduled    [name]  Baseline creatinine 1-1.2 over the past 10 years.  No significant proteinuria. Renal US with 0.9 cm mass. No monoclonal protein appreciated on SPEP immunofixation.  - CT renal mass ordered  - referral to nephrology for longstanding [name] of unclear etiology    Obesity - Hx Gastric Sleeve  Had sleeve ectomy back in 2015.  Has been taking vitamins and supplements as prescribed. Interested in further weight loss. Has struggled to lose weight despite regular exercise and changing diet. Does not have diabetes. Unable to obtain [facility].  - continue MVM, iron supplement, vitamin D   - scheduling EGD/colonoscopy with GI     Multiple Sclerosis: Diagnosed in 2003. Follows with [facility] Neurology.   - Continue Teriflunomide, Lyrica, Oxybutynin per neuro    Vitamin D deficiency: Vitamin D3 50,000 units weekly for 6 months and than 4,000 units daily.  - continue supplementation     GERD:  - continue protonix      OSA  Patient reports history  of OSA, has CPAP at home.  - Encouraged regular CPAP use     HCM:   Mammogram: done 06/04/2022  Colonoscopy: ordered, need to give phone number at next appt  Zoster Vaccines: Sent to pharmacy   Lipids: last checked 2016. ASCVD was 0.8%  Hgb A1c: 5.5% Nov 2023  HepC screen: 10/22/22    No follow-ups on file.    Staffed with Dr. Teressa Senter, discussed    __________________________________________________________    HPI:    Moved out of mom's house last week. Stress much improved since then. Headaches are still intermittent, but much better.  Denies SI or HI.    Increase lisinopril to 40 mg per day. Has been taking BP at home - last value 129/60 mmhg. Notes values 120s/60s often at home.    Needs refills of paroxetine, lisinopril, tizanidine.    Still seeing some spots in her vision, has plans to call her eye doctor to schedule an exam.    Back pain improved, but still present. Lidocaine patches not helpful. Ok to try voltaren gel.    No CP, dyspnea. Leg swelling stable.      __________________________________________________________      Medications:  Reviewed in EPIC  __________________________________________________________    Physical Exam:   Vital Signs:  There were no vitals filed for this visit.                 Gen: Well appearing, NAD, obese  CV: RRR  Lungs: CTAB  Extremities: Chronic venous stasis discoloration bilaterally. No ulcerations. Trace LE edema R>L.        Medication adherence and barriers to the treatment plan have been addressed. Opportunities to optimize healthy behaviors have been discussed. Patient / caregiver voiced understanding.

## 2023-09-30 DIAGNOSIS — I10 Essential (primary) hypertension: Principal | ICD-10-CM

## 2023-09-30 DIAGNOSIS — R600 Localized edema: Principal | ICD-10-CM

## 2023-09-30 DIAGNOSIS — R059 Cough, unspecified type: Principal | ICD-10-CM

## 2023-09-30 MED ORDER — OLMESARTAN 40 MG TABLET
ORAL_TABLET | Freq: Every day | ORAL | 1 refills | 0.00 days
Start: 2023-09-30 — End: ?

## 2023-10-03 MED ORDER — OLMESARTAN 40 MG TABLET
ORAL_TABLET | Freq: Every day | ORAL | 1 refills | 90.00 days | Status: CP
Start: 2023-10-03 — End: ?

## 2023-10-06 NOTE — Unmapped (Unsigned)
[facility] Special[facility] and Home Delivery Pharmacy Refill Coordination Note    Victoria Kelly, Victoria Kelly: January 15, 1971  Phone: [phone] (home) [phone] (work)      All above HIPAA information was verified with patient.         10/06/2023    12:08 PM   Specialty Rx Medication Refill Questionnaire   Which Medications would you like refilled and shipped? I have one week of medicine left, I can not afford this medication i have medical insurance Medicare and Medicaid and [place] health insurance   Please list all current allergies: Keflex,cymbolta   Have you missed any doses in the last 30 days? No   Have you had any changes to your medication(s) since your last refill? No   How many days remaining of each medication do you have at home? About 1 week   Have you experienced any side effects in the last 30 days? No   Please enter the full address (street address, city, state, zip code) where you would like your medication(s) to be delivered to. [address] [place] [address]   Please specify on which day you would like your medication(s) to arrive. Note: if you need your medication(s) within 3 days, please call the pharmacy to schedule your order at [phone]  10/17/2023   Has your insurance changed since your last refill? No   Would you like a pharmacist to call you to discuss your medication(s)? No   Do you require a signature for your package? (Note: if we are billing Medicare Part B or your order contains a controlled substance, we will require a signature) No         Completed refill call assessment today to schedule patient's medication shipment from the [facility] and Home Delivery Pharmacy [phone]).  All relevant notes have been reviewed.       Confirmed patient received a Conservation officer, historic buildings and a Surveyor, mining with first shipment. The patient will receive a drug information handout for each medication shipped and additional FDA Medication Guides as required. REFERRAL TO PHARMACIST     Referral to the pharmacist: Not needed      [facility]     Shipping address confirmed in Epic.     Delivery Scheduled: {Blank:19197::Yes, Expected medication delivery date: ***.,Yes, Expected medication delivery date: ***.  However, Rx request for refills was sent to the provider as there are none remaining.,Patient declined refill at this time due to ***.,No, cannot schedule delivery at this time as there are outstanding items that need addressed.  This note has been handed off to the provider for follow up.,Due to patient insurance changes, unable to fill at [facility], please route Rx to *** specialty pharmacy}     Medication will be delivered via {Blank:19197::UPS,Next Day Courier,Same Day Courier,Clinic Courier - *** clinic,***} to the {Blank:19197::prescription,temporary} address in Epic WAM.    Dan Europe   [facility] Specialty and Home Delivery Pharmacy Specialty {Blank:19197::Pharmacist,Technician}

## 2023-10-10 MED ORDER — METFORMIN ER 500 MG TABLET,EXTENDED RELEASE 24 HR
ORAL_TABLET | Freq: Every day | ORAL | 3 refills | 90.00 days
Start: 2023-10-10 — End: 2024-10-09

## 2023-10-10 NOTE — Unmapped (Signed)
Metformin discontinued 07/11/2023 request refused on 07/07/2023

## 2023-10-13 NOTE — Unmapped (Signed)
[facility] Refill Coordination Note    Specialty Medication(s) to be Shipped:   Neurology: Aubagio    Other medication(s) to be shipped: No additional medications requested for fill at this time     Victoria Kelly, DOB: 1971-04-20  Phone: [phone] (home) [phone] (work)      All above HIPAA information was verified with patient.     Was a Nurse, learning disability used for this call? No    Completed refill call assessment today to schedule patient's medication shipment from the [facility]  [phone]).  All relevant notes have been reviewed.     Specialty medication(s) and dose(s) confirmed: Regimen is correct and unchanged.   Changes to medications: Ayrica reports no changes at this time.  Changes to insurance: No  New side effects reported not previously addressed with a pharmacist or physician: None reported  Questions for the pharmacist: No    Confirmed patient received a Conservation officer, historic buildings and a Surveyor, mining with first shipment. The patient will receive a drug information handout for each medication shipped and additional FDA Medication Guides as required.       DISEASE/MEDICATION-SPECIFIC INFORMATION        N/A    SPECIALTY MEDICATION ADHERENCE     Medication Adherence    Patient reported X missed doses in the last month: 0  Specialty Medication: teriflunomide 14 mg Tab (AUBAGIO)  Patient is on additional specialty medications: No   Other adherence tool: Patient has a bag of medicines that she takes before bed; has routine   Support network for adherence: family member              Were doses missed due to medication being on hold? No      teriflunomide 14 mg Tab (AUBAGIO): 7 days of medicine on hand       REFERRAL TO PHARMACIST     Referral to the pharmacist: Not needed      [facility]     Shipping address confirmed in Epic.       Delivery Scheduled: Yes, Expected medication delivery date: 10/14/23.     Medication will be delivered via Same Day Courier to the prescription address in Epic WAM.    Dan Europe   [facility]  Specialty Technician

## 2023-10-14 MED FILL — TERIFLUNOMIDE 14 MG TABLET: ORAL | 30 days supply | Qty: 30 | Fill #2

## 2023-10-27 NOTE — Unmapped (Signed)
Copied from CRM #[id number]. Topic: Access To Clinicians - Medication Refill  >> Oct 27, 2023  3:26 PM Bria J wrote:  The PAC has received an incoming call requesting medication refill/clarification/question:    Caller: Janelle- pharmacists  Best callback number: [phone]    Type of request (refill, clarification, question): medication refill  Name of medication:   metFORMIN (GLUCOPHAGE-XR) 500 MG 24 hr tablet     Is there a preferred amount requested (e.g. 30 pills, 3 months worth - if no leave blank):   Desired pharmacy:     [facility] [place], [place] - [address]  [address]  [place] [address]  Phone: [phone] Fax: [phone]        Does caller request a callback from a nurse to discuss?: no

## 2023-10-31 MED ORDER — METFORMIN ER 500 MG TABLET,EXTENDED RELEASE 24 HR
ORAL_TABLET | Freq: Every day | ORAL | 3 refills | 90.00 days | Status: CP
Start: 2023-10-31 — End: 2024-10-30

## 2023-11-01 NOTE — Unmapped (Signed)
Addended by: Durene Romans on: 10/31/2023 06:52 PM     Modules accepted: Orders

## 2023-11-02 MED ORDER — METFORMIN ER 500 MG TABLET,EXTENDED RELEASE 24 HR
ORAL_TABLET | Freq: Every day | ORAL | 3 refills | 90.00 days | Status: CP
Start: 2023-11-02 — End: 2024-11-01

## 2023-11-02 NOTE — Unmapped (Signed)
Resent to different pharmacy 

## 2023-11-17 NOTE — Unmapped (Addendum)
The [facility] has made a second and final attempt to reach this patient to refill the following medication:teriflunomide 14 mg Tab (AUBAGIO).      We have been unable to leave messages on the following phone numbers: [phone], have sent a MyChart message, have sent a text message to the following phone numbers: [phone], and have sent a Mychart questionnaire..    Dates contacted: 01/16,01/30  Last scheduled delivery: 12/27    The patient may be at risk of non-compliance with this medication. The patient should call the [facility] at [phone]  Option 4, then Option 3: Allergy, Immunology, Pulmonary, Neurology to refill medication.    Dan Europe   [facility]

## 2023-11-24 NOTE — Unmapped (Signed)
[facility]  AMBULATORY CARE CENTER  [address]                                 [place], [place] [address]    [phone]    Victoria Kelly did not show for their scheduled physical therapy Evaluation. Patient did not notify us prior to missing our scheduled appointment.     Please contact me if you have any questions or concerns.      Thank you for this referral,     Signed: Timmothy Euler, PT  11/24/2023 2:05 PM

## 2023-11-27 NOTE — Unmapped (Signed)
 Internal Medicine Clinic Visit    Reason for visit: Follow up back pain    A/P:         1. Stage 3a chronic kidney disease (CMS-HCC)    2. Morbid (severe) obesity due to excess calories (CMS-HCC)    3. Multiple sclerosis (CMS-HCC)    4. Sinusitis, unspecified chronicity, unspecified location      HTN  Blood pressure slightly elevated in clinic today, instructed to recheck BP values at home.  - continue olmesartan 40 mg daily  - continue amlodipine 5 mg daily  - will follow up later this week when I call her with lab results    [name]  Osteoporosis  Sustained [name] in June 2024 after mechanical fall in the shower. DEXA with low bone mass. eConsulted endocrinology for recs re: osteoporosis management in the setting of [name], bariatric surgery, MS  - continue Vit D, Ca supplements  - continue tylenol   - continue tizanidine prn  - continue calcitonin nasal spray  - seen by spine clinic, recommended trial of PT and MRI L spine which has been ordered  - provided phone # to reschedule physical therapy appointment  - check PTH, phos today  - if GFR stable, will plan to start risedronate    Obesity - Hx Gastric Sleeve  Had sleeve ectomy back in 2015.  Has been taking vitamins and supplements as prescribed. Interested in further weight loss. Has struggled to lose weight despite regular exercise and changing diet. Does not have diabetes. Unable to obtain Wegovy due to insurance issues (can re-assess at future appts if insurance starts to approve GLP-1 for weight loss).  - continue MVM, iron supplement, vitamin D   - no weight loss with metformin, will dc and trial phentermine with 6 week follow-up  - may need re-evaluation for additional weight loss surgery if pharmacologic interventions do not help    Anxiety/Depression - Stress  Previously followed with psychiatry. Notes significant stress surrounding living situation and family members. Has learned to cope with stress by separating herself from stressful situations. States that her anxiety gets uncontrolled when she does not take her paroxetine. Denies SI/HI. [name]7 remains elevated  - Offered referral to counseling, pt declines at this time  - Continue paroxetine 60 mg daily (encouraged daily use)    Leg Swelling  Was seen in clinic several times for R>L swelling. Initial exam with BL chronic venous stasis changes. Exam not consistent with cellulitis. PVL negative for DVT. Seems to have improved with elevation as well as 1 week trial of lasix. Suspect her swelling may be multifactorial in the setting of venous stasis, [name], and possible component of lymphedema.   - encouraged leg elevation, recommended compression stockings  - recommended reduction of Lyrica dose as this could be causing some swelling    [name]  Baseline creatinine 1-1.2 over the past 10 years.  No significant proteinuria. Renal US with 0.9 cm mass. No monoclonal protein appreciated on SPEP immunofixation.  - seen by nephrology, [name] unclear etiology however suspect familial component given significant family history  - plan to continue BP control, avoid NSAIDs/nephrotoxins    Multiple Sclerosis: Diagnosed in 2003. Follows with [facility].   - Continue Teriflunomide, Lyrica, Oxybutynin per neuro    Vitamin D deficiency: Vitamin D3 50,000 units weekly for 6 months and than 4,000 units daily.  - continue supplementation     GERD:  - continue protonix      OSA  Patient reports history of OSA, has CPAP at home.  - Encouraged regular CPAP use     HCM:   Mammogram: Sep 2024  Colonoscopy: FIT testing ordered  Zoster Vaccines: Sent to pharmacy   Lipids: last checked 2016. ASCVD was 0.8%  Hgb A1c: 5.5% Nov 2023  HepC screen: 10/22/22    No follow-ups on file.    Staffed with Dr. Hulan Fess, discussed    __________________________________________________________    HPI:    Says she is doing fine.  Says that she has been dealing with a fair amount of stress in her personal life.  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  Patient has subsequently distanced herself from this cousin and feels that her stress levels have improved with this change in her life.  She does sometimes report that she will miss doses of her paroxetine.  She is not taking her paroxetine consistently, she then deals with some uncontrolled anxiety.  Discussed importance of taking this medication daily.    Still having some back pain located at the same spot of prior [name].  Has seen spine clinic.  Understands recommendations for physical therapy, however she did miss this appointment recently.    States her leg swelling is about the same.  Her swelling does improve with elevation.  Discussed that Lyrica could be contributing to her leg swelling.  recommended compression stockings.    __________________________________________________________      Medications:  Reviewed in EPIC  __________________________________________________________    Physical Exam:   Vital Signs:  Vitals:    11/28/23 1032   Resp: 13   Temp: 35.9 ??C (96.6 ??F)   TempSrc: Temporal   SpO2: 97%   Weight: (!) 137 kg (302 lb)   Height: 170.2 cm (5' 7)                    Gen: Well appearing, NAD, obese  CV: RRR  Lungs: CTAB  Extremities: Chronic venous stasis discoloration bilaterally. No ulcerations. Mild pitting LE edema bilaterally        Medication adherence and barriers to the treatment plan have been addressed. Opportunities to optimize healthy behaviors have been discussed. Patient / caregiver voiced understanding.

## 2023-11-28 ENCOUNTER — Ambulatory Visit: Admit: 2023-11-28 | Discharge: 2023-11-28 | Payer: MEDICARE

## 2023-11-28 DIAGNOSIS — S32010S Wedge compression fracture of first lumbar vertebra, sequela: Principal | ICD-10-CM

## 2023-11-28 DIAGNOSIS — Z1211 Encounter for screening for malignant neoplasm of colon: Principal | ICD-10-CM

## 2023-11-28 DIAGNOSIS — J329 Chronic sinusitis, unspecified: Principal | ICD-10-CM

## 2023-11-28 DIAGNOSIS — G35 Multiple sclerosis: Principal | ICD-10-CM

## 2023-11-28 DIAGNOSIS — N1831 Stage 3a chronic kidney disease (CMS-HCC): Principal | ICD-10-CM

## 2023-11-28 LAB — BASIC METABOLIC PANEL
ANION GAP: 9 mmol/L (ref 5–14)
BLOOD UREA NITROGEN: 14 mg/dL (ref 9–23)
BUN / CREAT RATIO: 11
CALCIUM: 9.5 mg/dL (ref 8.7–10.4)
CHLORIDE: 105 mmol/L (ref 98–107)
CO2: 30.5 mmol/L (ref 20.0–31.0)
CREATININE: 1.29 mg/dL — ABNORMAL HIGH (ref 0.55–1.02)
EGFR CKD-EPI (2021) FEMALE: 49 mL/min/{1.73_m2} — ABNORMAL LOW (ref >=60)
GLUCOSE RANDOM: 89 mg/dL (ref 70–179)
POTASSIUM: 4.2 mmol/L (ref 3.4–4.8)
SODIUM: 144 mmol/L (ref 135–145)

## 2023-11-28 MED ORDER — FLUTICASONE PROPIONATE 50 MCG/ACTUATION NASAL SPRAY,SUSPENSION
Freq: Every day | NASAL | 6 refills | 60.00 days | Status: CP
Start: 2023-11-28 — End: 2024-08-02

## 2023-11-28 MED ORDER — PHENTERMINE 15 MG CAPSULE
ORAL_CAPSULE | Freq: Every morning | ORAL | 0 refills | 90.00 days | Status: CP
Start: 2023-11-28 — End: ?

## 2023-11-28 MED ORDER — PREGABALIN 75 MG CAPSULE
ORAL_CAPSULE | Freq: Every evening | ORAL | 1 refills | 360.00 days | Status: CP
Start: 2023-11-28 — End: ?

## 2023-11-28 NOTE — Unmapped (Addendum)
Thank you for seeing [place] in clinic today!    Please call [phone] to reschedule your physical therapy appointment.    I will check some bloodwork and reach out with results. We will likely start a bone-strengthening medication    Keep your legs elevated and try compression stockings to reduce your leg swelling.    Stop metformin and start phentermine (for weight loss). Follow up with me in 6 weeks.

## 2023-11-28 NOTE — Unmapped (Signed)
[facility] at [facility]     Reason for visit: Follow up    Questions / Concerns that need to be addressed: Follow up        Diabetes:  Regularly checking blood sugars?: no  If yes, when? Complete log for past 7 days  Date Before Breakfast After Breakfast Before Lunch After Lunch Before Dinner After Dinner Before Bed                                                                                                                                     Hypertension:  Have blood pressure cuff at home?: no  Regularly checking blood pressure?: no  If patient has a  record of recent blood pressures, please enter into flowsheets using this link     PTHomeBP          Omron BPs (complete if screening BP has a systolic  > 129 or diastolic > 79)  BP#1 107/86   BP#2 130/96   BP#3 119/89   Average  119/90        Allergies reviewed: Yes    Medication reviewed: Yes  Pended refills? Yes        HCDM reviewed and updated in Epic:    We are working to make sure all of our patients??? wishes are updated in Epic and part of that is documenting a Environmental health practitioner for each patient  A Health Care Decision Maker is someone you choose who can make health care decisions for you if you are not able - who would you most want to do this for you????  is already up to date.        BPAs completed:  PHQ2  PHQ9  [name]7  AUDIT - Alcohol Screen  HARK - Interpersonal Violence      COVID-19 Vaccine Summary  Which COVID-19 Vaccine was administered  Pfizer  Type:  Dates Given:                   If no: Are you interested in scheduling? Wants vaccine - eligible now    Immunization History   Administered Date(s) Administered    COVID-19 VAC,BIVALENT(71YR UP),PFIZER 09/03/2021    COVID-19 VACC,MRNA,(PFIZER)(PF) 01/16/2020, 02/06/2020, 08/26/2020    Covid-19 Vac, (20yr+) (Spikevax) Monovalent Moderna 08/30/2022    INFLUENZA TIV (TRI) PF (IM)(HISTORICAL) 08/17/2007, 08/20/2008, 11/20/2009, 07/09/2011    Influenza Vaccine Quad (IIV4 W/PRESERV) 66MO+ 07/24/2013    Influenza Vaccine Quad(IM)6 MO-Adult(PF) 08/25/2015, 11/12/2016, 07/08/2017, 07/14/2020, 09/03/2021, 08/30/2022    Influenza Virus Vaccine, unspecified formulation 08/25/2015, 11/12/2016, 07/08/2017    PNEUMOCOCCAL POLYSACCHARIDE 23-VALENT 02/10/2012    TdaP 03/14/2012, 08/25/2015       __________________________________________________________________________________________    SCREENINGS COMPLETED IN FLOWSHEETS    HARK Screening       AUDIT       PHQ2       PHQ9  Link for Multi-language PHQ/[name] screeners: https://www.phqscreeners.com/select-screener    P4 Suicidality Screener                [name]7       COPD Assessment       Falls Risk       .imcres

## 2023-11-29 ENCOUNTER — Inpatient Hospital Stay: Admit: 2023-11-29 | Discharge: 2023-11-30 | Payer: MEDICARE

## 2023-12-02 DIAGNOSIS — M810 Age-related osteoporosis without current pathological fracture: Principal | ICD-10-CM

## 2023-12-02 MED ORDER — RISEDRONATE 5 MG TABLET
ORAL_TABLET | Freq: Every day | ORAL | 3 refills | 90.00 days | Status: CP
Start: 2023-12-02 — End: 2024-12-01

## 2023-12-14 NOTE — Unmapped (Unsigned)
I provided an intervention for the [employer] [place] [employer], Housing, and Transportation Needs SDOH domain. The intervention was Provided [employer]

## 2023-12-15 ENCOUNTER — Ambulatory Visit: Admit: 2023-12-15 | Discharge: 2023-12-16 | Payer: MEDICARE

## 2023-12-20 MED ORDER — CALCITONIN (SALMON) 200 UNIT/ACTUATION NASAL SPRAY
Freq: Every day | NASAL | 12 refills | 0.00 days
Start: 2023-12-20 — End: 2024-12-19

## 2023-12-20 MED ORDER — NAPROXEN 500 MG TABLET
ORAL_TABLET | Freq: Two times a day (BID) | ORAL | 2 refills | 30.00 days
Start: 2023-12-20 — End: 2024-12-19

## 2023-12-20 NOTE — Unmapped (Signed)
 Pharmacy requesting refill(s)  Calcitonin-salmon  discontinued on 12/02/2023    Naproxen discontinued on 9/30

## 2024-01-08 NOTE — Unmapped (Signed)
 Internal Medicine Clinic Visit    Reason for visit: Follow up back pain    A/P:         1. Adrenal nodule    2. Essential hypertension    3. Hypertension secondary to other renal disorders    4. Osteoporosis, unspecified osteoporosis type, unspecified pathological fracture presence      HTN  Blood pressure controlled in clinic today.  - continue olmesartan 40 mg daily  - continue amlodipine 5 mg daily    [name]  Osteoporosis  Sustained [name] in June 2024 after mechanical fall in the shower. DEXA with low bone mass. eConsulted endocrinology for recs re: osteoporosis management in the setting of [name], bariatric surgery, MS  - MRI C/T spine done but not L spine last month - will message spine clinic to see if dedicated L spine MRI is still needed  - continue Vit D, Ca supplements   - check Vit D again today  - continue tylenol   - continue tizanidine prn  - continue calcitonin nasal spray  - seen by spine clinic, recommended trial of PT and MRI L spine which has been ordered  - provided phone # to reschedule physical therapy appointment  - check PTH, phos today  - start risedronate    2.6 cm L Adrenal Nodule  Endocrine e-Consulted in Sep 2024 for osteoporosis, adrenal nodule in pt with hx of gastric sleeve. Will need to screen for cortisol excess  - Discuss dex suppression test at next visit  - Aldosterone, renin, plasma metanephrines per endo recs (has been difficult to get these drawn, will do so today)    Obesity - Hx Gastric Sleeve  Had sleeve ectomy back in 2015.  Has been taking vitamins and supplements as prescribed. Interested in further weight loss. Has struggled to lose weight despite regular exercise and changing diet. Does not have diabetes. Unable to obtain Wegovy due to insurance issues (can re-assess at future appts if insurance starts to approve GLP-1 for weight loss).  - continue MVM, iron supplement, vitamin D   - start bupropion for wt loss  - no weight loss with metformin, will dc and trial phentermine with 6 week follow-up  - may need re-evaluation for additional weight loss surgery if pharmacologic interventions do not help    Anxiety/Depression - Stress  Previously followed with psychiatry. Notes significant stress surrounding living situation and family members. Has learned to cope with stress by separating herself from stressful situations. States that her anxiety gets uncontrolled when she does not take her paroxetine. Denies SI/HI. [name]7 remains elevated  - Offered referral to counseling, pt declines at this time  - Continue paroxetine 60 mg daily (encouraged daily use)  - Add bupropion for weight loss + mood    Leg Swelling  Was seen in clinic several times for R>L swelling. Initial exam with BL chronic venous stasis changes. Exam not consistent with cellulitis. PVL negative for DVT. Seems to have improved with elevation as well as 1 week trial of lasix. Suspect her swelling may be multifactorial in the setting of venous stasis, [name], and possible component of lymphedema.   - encouraged leg elevation, recommended compression stockings  - recommended reduction of Lyrica dose as this could be causing some swelling    [name]  Baseline creatinine 1-1.2 over the past 10 years.  No significant proteinuria. Renal US with 0.9 cm mass. No monoclonal protein appreciated on SPEP immunofixation.  - seen by nephrology, [name]  unclear etiology however suspect familial component given significant family history  - plan to continue BP control, avoid NSAIDs/nephrotoxins    Multiple Sclerosis: Diagnosed in 2003. Follows with [facility].   - Continue Teriflunomide, Lyrica, Oxybutynin per neuro    Vitamin D deficiency: Vitamin D3 50,000 units weekly for 6 months and than 4,000 units daily.  - continue supplementation     GERD:  - continue protonix      OSA  Patient reports history of OSA, has CPAP at home.  - Encouraged regular CPAP use     HCM:   Mammogram: Sep 2024  Colonoscopy: FIT testing ordered  Zoster Vaccines: Sent to pharmacy   Lipids: last checked 2016. ASCVD was 0.8%  Hgb A1c: 5.5% Nov 2023  HepC screen: 10/22/22    Return in about 4 weeks (around 02/06/2024).    Staffed with Dr. Teressa Senter, discussed    __________________________________________________________    HPI:    Victoria Kelly after leaning on a doorway. Has some flank tenderness but has remained ambulatory  No leg weakness  No midline spine tenderness  No bowel/bladder incontinence    Overall feels fine after the fall, but has a small bruise on her flank    Leg swelling is stable, discussed elevation and compression socks        __________________________________________________________      Medications:  Reviewed in EPIC  __________________________________________________________    Physical Exam:   Vital Signs:  Vitals:    01/09/24 1322   BP: 110/79   BP Site: L Arm   BP Position: Sitting   BP Cuff Size: X-Large   Pulse: 68   Resp: 18   Temp: 36.4 ??C (97.6 ??F)   TempSrc: Temporal   SpO2: 99%   Weight: (!) 134.7 kg (297 lb)   Height: 170.2 cm (5' 7)                      Gen: Well appearing, NAD, obese  CV: RRR  Lungs: CTAB  Back: No midline C/T/L spine tenderness. Small area of ecchymosis on left lateral flank, no overlying tenderness.  Extremities: Mild pitting LE edema bilaterally        Medication adherence and barriers to the treatment plan have been addressed. Opportunities to optimize healthy behaviors have been discussed. Patient / caregiver voiced understanding.

## 2024-01-09 ENCOUNTER — Ambulatory Visit: Admit: 2024-01-09 | Discharge: 2024-01-09 | Payer: MEDICARE

## 2024-01-09 DIAGNOSIS — I151 Hypertension secondary to other renal disorders: Principal | ICD-10-CM

## 2024-01-09 DIAGNOSIS — E279 Disorder of adrenal gland, unspecified: Principal | ICD-10-CM

## 2024-01-09 DIAGNOSIS — I10 Essential (primary) hypertension: Principal | ICD-10-CM

## 2024-01-09 DIAGNOSIS — M810 Age-related osteoporosis without current pathological fracture: Principal | ICD-10-CM

## 2024-01-09 LAB — PARATHYROID HORMONE (PTH): PARATHYROID HORMONE INTACT: 176.3 pg/mL — ABNORMAL HIGH (ref 18.4–80.1)

## 2024-01-09 LAB — CALCIUM: CALCIUM: 9.5 mg/dL (ref 8.7–10.4)

## 2024-01-09 LAB — PHOSPHORUS: PHOSPHORUS: 3.5 mg/dL (ref 2.4–5.1)

## 2024-01-09 MED ORDER — BUPROPION HCL XL 150 MG 24 HR TABLET, EXTENDED RELEASE
ORAL_TABLET | Freq: Every morning | ORAL | 0 refills | 90 days | Status: CP
Start: 2024-01-09 — End: 2024-04-08

## 2024-01-09 MED ORDER — AMLODIPINE 5 MG TABLET
ORAL_TABLET | Freq: Every day | ORAL | 3 refills | 90 days | Status: CP
Start: 2024-01-09 — End: 2025-01-08

## 2024-01-09 MED ORDER — OLMESARTAN 40 MG TABLET
ORAL_TABLET | Freq: Every day | ORAL | 1 refills | 90 days | Status: CP
Start: 2024-01-09 — End: ?

## 2024-01-09 MED ORDER — RISEDRONATE 5 MG TABLET
ORAL_TABLET | Freq: Every day | ORAL | 3 refills | 90 days | Status: CP
Start: 2024-01-09 — End: 2025-01-08

## 2024-01-09 NOTE — Unmapped (Signed)
 [facility] at [facility]     Reason for visit: Follow up    Questions / Concerns that need to be addressed: Had a fall a week ago today, now having back pain. And need BP medicine refilled.    Screening BP- 110/79 P 68    Omron BPs (complete if screening BP has a systolic  > 130 or diastolic > 80)  BP#1    BP#2   BP#3     Average BP   (please note this as a comment in vitals)     PTHomeBP     Diabetes:  Regularly checking blood sugars?: no  If yes, when? Complete log for past 7 days  Date Before Breakfast After Breakfast Before Lunch After Lunch Before Dinner After Dinner Before [employer] or Libre CGM in use? If so, pull appropriate reporting through portal ([employer]) or EPIC order Josephine Igo).    HCDM reviewed and updated in Epic:    We are working to make sure all of our patients??? wishes are updated in Epic and part of that is documenting a Environmental health practitioner for each patient  A Health Care Decision Maker is someone you choose who can make health care decisions for you if you are not able - who would you most want to do this for you????  is already up to date.    HCDM (patient stated preference): Poteat,Chanel - Daughter - [phone]    BPAs completed:  PHQ9 and [name]7    Annual Screenings:   Tobacco  __________________________________________________________________________________________    SCREENINGS COMPLETED IN FLOWSHEETS      AUDIT       PHQ2       PHQ9          [name]7       COPD Assessment       Falls Risk

## 2024-01-09 NOTE — Unmapped (Addendum)
 Thank you for seeing [place] in clinic today!    Start risedronate (Actonel) 5 mg daily for bone health  About 1 week later, start bupropion (Wellbutrin) 150 mg daily for weight loss + mood  I will follow-up your bloodwork and call you with results  Call the spine clinic at  [phone] to schedule follow-up  Start using compression stockings to help with leg swelling

## 2024-01-11 LAB — VITAMIN D 25 HYDROXY: VITAMIN D, TOTAL (25OH): 12.8 ng/mL — ABNORMAL LOW (ref 20.0–80.0)

## 2024-01-12 LAB — METANEPHRINES, FRACTIONATED FREE PLASMA
METANEPHRINE FREE PLASMA: <0.2 nmol/L
NORMETANEPHRINE FREE PLASMA: 1 nmol/L — ABNORMAL HIGH

## 2024-01-13 LAB — RENIN ASSAY: RENIN PLASMA: 1.3 ng/mL/h

## 2024-01-16 NOTE — Unmapped (Signed)
 Addended by: Stanford Scotland on: 01/16/2024 10:48 AM     Modules accepted: Level of Service

## 2024-01-16 NOTE — Unmapped (Signed)
 Immediately after or during the visit, I reviewed the medical history and the resident???s findings on physical examination with the resident. I discussed the patient???s diagnosis and treatment plan with the resident and concur with the treatment plan as documented in the resident note. Stanford Scotland, MD

## 2024-01-20 DIAGNOSIS — M47816 Spondylosis without myelopathy or radiculopathy, lumbar region: Principal | ICD-10-CM

## 2024-01-20 DIAGNOSIS — G35 Multiple sclerosis: Principal | ICD-10-CM

## 2024-01-24 MED ORDER — ERGOCALCIFEROL (VITAMIN D2) 1,250 MCG (50,000 UNIT) CAPSULE
ORAL_CAPSULE | ORAL | 0 refills | 84.00 days | Status: CP
Start: 2024-01-24 — End: 2024-04-23

## 2024-01-24 MED ORDER — MODAFINIL 100 MG TABLET
ORAL_TABLET | Freq: Every day | ORAL | 1 refills | 60.00 days | Status: CP
Start: 2024-01-24 — End: ?

## 2024-01-24 NOTE — Unmapped (Signed)
 Request received via interface.     Provider: Clara Zelasky/Dr. Mardell Shade    Last Visit Date: 07/22/2023  Next Visit Date: Visit date not found    Lab Results   Component Value Date    Hepatitis C Ab Nonreactive 10/21/2022    HIV Antigen/Antibody Combo Nonreactive 02/10/2012        Results for orders placed during the hospital encounter of 11/29/23    MRI Brain Wo Contrast    Narrative  EXAM: Magnetic resonance imaging, brain, without contrast material.  ACCESSION: [id number] UN      CLINICAL INDICATION: 53 years old Female with MS  - G35 - Multiple sclerosis (CMS - HCC)    COMPARISON: MRI brain with and without contrast 05/26/2021    TECHNIQUE: Multiplanar, multisequence MR imaging of the brain was performed without I.V. contrast.    FINDINGS:  There are multiple foci of T2/FLAIR hyperintensity in the infratentorial, juxtacortical and periventricular white matter which appear unchanged. Some of the lesions demonstrate corresponding T1 black holes consistent with axonal loss. No new lesions visualized.    The optic nerves are normal in appearance. No diffuse brain volume loss. Ventricles are normal in size.    There is no evidence of intracranial hemorrhage, acute infarct, or mass.    Impression  Multiple white matter lesions in a distribution consistent with multiple sclerosis, unchanged compared to prior MRI performed 05/26/2021.      Results for orders placed during the hospital encounter of 11/29/23    MRI Thoracic Spine Wo Contrast    Narrative  EXAM: Magnetic resonance imaging, spinal canal and contents, thoracic, without contrast material.  DATE: 11/29/2023 9:47 AM  ACCESSION: [id number] UN  DICTATED: 11/29/2023 9:53 AM  INTERPRETATION LOCATION: [facility]    CLINICAL INDICATION: 54 years old Female with MS  - G35 - Multiple sclerosis (CMS - HCC)    COMPARISON: MRI 05/26/2021 and x-ray 04/05/2023    TECHNIQUE: Multiplanar MRI was performed through the thoracic spine without contrast administration    FINDINGS:  Bone marrow signal intensity is normal. Moderate wedge deformity of L2. Alignment is normal. Multilevel disc space height loss, most notable at T11-T12 with associated Modic 2 endplate changes. No significant spinal canal or neural foraminal narrowing.    Slightly enlargement of T2-weighted hyperintense lesion seen in the right lateral cord at T3 (6:7).    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    The paraspinal tissues are within normal limits.    Impression  - The focus of T2 hyperintensity at the level of T3 is slightly larger when compared to prior.  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  - Stable wedge deformity at L1.      Results for orders placed during the hospital encounter of 11/29/23    MRI Cervical Spine Wo Contrast    Narrative  EXAM: Magnetic resonance imaging, spinal canal and contents, cervical without contrast material.  DATE: 11/29/2023 9:47 AM  ACCESSION: [id number] UN  DICTATED: 11/29/2023 10:59 AM  INTERPRETATION LOCATION: [facility]    CLINICAL INDICATION: 53 years old Female with MS  - G35 - Multiple sclerosis (CMS - HCC)    COMPARISON: Cervical MRI 05/26/2021    TECHNIQUE: Multiplanar multisequence MRI was performed through the cervical spine without intravenous contrast.    FINDINGS:  Bone marrow signal intensity is heterogeneous.    The vertebral bodies are normally aligned. Multilevel disc space height loss associated osteophytosis, notably at C4-C5 and C5-C6.    Disc bulge at C4-C5 results in mild spinal  canal narrowing, progressed from prior. Advanced left-sided joint arthropathy contributes to mild left-sided neural foraminal narrowing at this level.    Disc bulge with left subarticular protrusion at C5-C6 without significant spinal canal narrowing. Moderate facet arthropathy results in mild bilateral neural foraminal stenosis.    Similar size of right lateral T2 hyperintensity at the level of C2 and C3 (5:8).    The paraspinal tissues are within normal limits.    Impression  - Stable T2 hyperintense lesions in the right lateral cord at C2 and C3.  - Progressed multilevel degenerative spondylosis with mild spinal canal narrowing at C4-C5.

## 2024-01-26 DIAGNOSIS — M810 Age-related osteoporosis without current pathological fracture: Principal | ICD-10-CM

## 2024-01-26 DIAGNOSIS — I10 Essential (primary) hypertension: Principal | ICD-10-CM

## 2024-01-26 MED ORDER — OLMESARTAN 40 MG TABLET
ORAL_TABLET | Freq: Every day | ORAL | 11 refills | 0.00 days
Start: 2024-01-26 — End: ?

## 2024-01-27 MED ORDER — OLMESARTAN 40 MG TABLET
ORAL_TABLET | Freq: Every day | ORAL | 11 refills | 90.00 days
Start: 2024-01-27 — End: ?

## 2024-01-27 NOTE — Unmapped (Signed)
 Last ordered: 2 weeks ago (01/09/2024) by Clifm Dame, MD     Last refill: 01/25/2024   With 11 refills

## 2024-01-30 ENCOUNTER — Ambulatory Visit: Admit: 2024-01-30 | Discharge: 2024-01-31 | Payer: Medicare (Managed Care) | Attending: Neurology | Primary: Neurology

## 2024-01-30 DIAGNOSIS — G35 Multiple sclerosis: Principal | ICD-10-CM

## 2024-01-30 DIAGNOSIS — Z5181 Encounter for therapeutic drug level monitoring: Principal | ICD-10-CM

## 2024-01-30 DIAGNOSIS — E559 Vitamin D deficiency, unspecified: Principal | ICD-10-CM

## 2024-01-30 LAB — COMPREHENSIVE METABOLIC PANEL
ALBUMIN: 3.1 g/dL — ABNORMAL LOW (ref 3.4–5.0)
ALKALINE PHOSPHATASE: 70 U/L (ref 46–116)
ALT (SGPT): 12 U/L (ref 10–49)
ANION GAP: 10 mmol/L (ref 5–14)
AST (SGOT): 18 U/L (ref <=34)
BILIRUBIN TOTAL: 0.3 mg/dL (ref 0.3–1.2)
BLOOD UREA NITROGEN: 13 mg/dL (ref 9–23)
BUN / CREAT RATIO: 13
CALCIUM: 9.1 mg/dL (ref 8.7–10.4)
CHLORIDE: 107 mmol/L (ref 98–107)
CO2: 28.7 mmol/L (ref 20.0–31.0)
CREATININE: 1.04 mg/dL — ABNORMAL HIGH (ref 0.55–1.02)
EGFR CKD-EPI (2021) FEMALE: 64 mL/min/1.73m2 (ref >=60)
GLUCOSE RANDOM: 94 mg/dL (ref 70–179)
POTASSIUM: 4.5 mmol/L (ref 3.4–4.8)
PROTEIN TOTAL: 6.2 g/dL (ref 5.7–8.2)
SODIUM: 146 mmol/L — ABNORMAL HIGH (ref 135–145)

## 2024-01-30 LAB — CBC W/ AUTO DIFF
BASOPHILS ABSOLUTE COUNT: 0.1 10*9/L (ref 0.0–0.1)
BASOPHILS RELATIVE PERCENT: 1.3 %
EOSINOPHILS ABSOLUTE COUNT: 0.2 10*9/L (ref 0.0–0.5)
EOSINOPHILS RELATIVE PERCENT: 2.8 %
HEMATOCRIT: 37.3 % (ref 34.0–44.0)
HEMOGLOBIN: 12.7 g/dL (ref 11.3–14.9)
LYMPHOCYTES ABSOLUTE COUNT: 1.7 10*9/L (ref 1.1–3.6)
LYMPHOCYTES RELATIVE PERCENT: 30.5 %
MEAN CORPUSCULAR HEMOGLOBIN CONC: 34.1 g/dL (ref 32.0–36.0)
MEAN CORPUSCULAR HEMOGLOBIN: 29.3 pg (ref 25.9–32.4)
MEAN CORPUSCULAR VOLUME: 85.8 fL (ref 77.6–95.7)
MEAN PLATELET VOLUME: 7.8 fL (ref 6.8–10.7)
MONOCYTES ABSOLUTE COUNT: 0.3 10*9/L (ref 0.3–0.8)
MONOCYTES RELATIVE PERCENT: 6.4 %
NEUTROPHILS ABSOLUTE COUNT: 3.2 10*9/L (ref 1.8–7.8)
NEUTROPHILS RELATIVE PERCENT: 59 %
NUCLEATED RED BLOOD CELLS: 0 /100{WBCs} (ref <=4)
PLATELET COUNT: 230 10*9/L (ref 150–450)
RED BLOOD CELL COUNT: 4.34 10*12/L (ref 3.95–5.13)
RED CELL DISTRIBUTION WIDTH: 13.7 % (ref 12.2–15.2)
WBC ADJUSTED: 5.5 10*9/L (ref 3.6–11.2)

## 2024-01-30 MED ORDER — ERGOCALCIFEROL (VITAMIN D2) 1,250 MCG (50,000 UNIT) CAPSULE
ORAL_CAPSULE | ORAL | 0 refills | 84.00 days | Status: CP
Start: 2024-01-30 — End: 2024-04-29

## 2024-01-30 NOTE — Unmapped (Addendum)
 Thank you for visiting [facility] today.    Please find our contact information below.      In case of:  a suspected relapse (new symptoms or worsening existing symptoms, lasting for >24h)  OR  a need for a regular appointment  OR  you would have other questions:    Please contact:  [facility]  [facility]  [address], [place], [place] [address]  Fax: [phone]    For clinical and scheduling questions, please contact:  RN, phone: [phone]     If you need assistance from our social worker, please contact:   Maylon Spearing, phone: [phone]    If you need to speak with the pharmacist, please contact:  Ronette Coffer, PharmD,CPP; phone: [phone]    If you would have questions outside regular office hours, please call [facility] hospital operator:   Phone: [phone], and ask for a neurology resident on-call.             Candyce Champagne, MD  Clinical Associate Professor of Neurology  [facility], Department of Neurology  Multiple Sclerosis/Neuroimmunology Division  [facility]    [facility] is a specialty clinic and there is a need for you to have a  primary care provider  who will take care of your non-neurological health.   ....................................................................................................    Our recommendations from today's visit:   Continue teriflunomide  14 mg/day, labs today  Start Vitamin D  50.000 units once  a week for 6 months after that continue over the counter 4000 units/day  Continue pregabalin  75 mg at bedtime  Decrease Tizanidine  to 2 mg at bedtime and then stop  \Start using CPAP  Continue modafinil  200 mg in the morning  Switch oxybutinin 5 mg XL to bedtime  Follow up in 6 months, sooner if needed  ........................................................................................Aaron Aas    It was a pleasure to see you today!    We are grateful for the opportunity to contribute to your care and are looking forward to seeing you again.  Please if you can take time to complete a post-visit survey so we can identify opportunities for improvement. We also like to know when we do things well so we can continue providing great service.

## 2024-01-30 NOTE — Unmapped (Signed)
 [employer]  [facility]  Multiple Sclerosis/Neuroimmunology Division    [facility], [facility]  [address] [place], [place] [address]     Candyce Champagne, MD  Associate Professor of Neurology    DATE OF VISIT: 01/16/18    Re:  Victoria Kelly  [address] [place] [address]  MRN: [id number]  DOB: May 31, 1971      Visit: Follow-up Visit      REASON FOR VISIT: [name], a 53 y.o. African American right handed female, is seen in consultation at the [facility], Multiple Sclerosis/Neuroimmunology Division for the follow-up of MS.  [name] was last time seen at the [facility] by Enis Harsh, on 07/22/2023.      Assessment:     1. I took a detailed intreval history of the present illness fromMs. Victoria Kelly , details on past medical history, family history and social history.  2. I personally reviewed  patient's interval medical records, MRI images radiology reports, and laboratory work, and discussed them with the patient.    3. I performed neurological examination.  Patient completed PHQ-9 and timed 25 foot walk.  5.  [name] agreed with the recommended diagnostic and treatment plan that was discussed.                                                                                                                                                     ?? Multiple sclerosis:  Disease evolution is since 2003.   The patient is on Aubagio /teriflunomide , tolerates well.  The patient has been MS relapse free since starting with teriflunomide  and without a MS disease progression.  I personally reviewed MRI studies : Brain, cervical, thoracic spine MRI without contrast from 11/29/2023 that showed stable brain MRI lesion load compared with 05/26/2021 study stable lesion involving the cervical spine with somewhat progressed multilevel degenerative disc disease, and likely enlarged lesion at the level of T3 compared with prior study on 05/26/2021 , although it may be also due to the difference in technique. The patient will continue on teriflunomide .    Plan:     Continue teriflunomide  14 mg/day, labs today, CBC/differential and CMP for safety teriflunomide  monitoring, results reviewed.  Start Vitamin D  50.000 units once  a week for 6 months after that continue over the counter 4000 units/day  Continue pregabalin  75 mg at bedtime  Decrease Tizanidine  to 2 mg at bedtime and then stop  Start using CPAP the patient states she has it at home  Continue modafinil  200 mg in the morning: fatigue management  Switch oxybutinin 5 mg XL to bedtime  Follow up in 6 months, sooner if needed     Subjective:  HISTORY OF PRESENT ILLNESS:   [name] has been diagnosed with MS in 2003.  In 2003 she had left sided ON. Right sided ON occurred after a month.  Please see my first visit note dated 03/28/2017 and subsequent neurology clinic notes including the last visit note by Enis Harsh on 07/22/2023, for details of the HPI.    Started with Aubagio  at the end of June 2018. Tolerates it well. She underwent partial hysterectomy.     Interval history since her last visit on 07/22/2023:  No MS relapse    Current symptoms:  Vision/double vision: No symptoms reported  Speech, swallowing problems: None reported  Weakness: None reported  Fatigue: Yes yes fatigue 8/10, also has sleep apnea but is not using CPAP, takes modafinil  200 mg in the morning  Tingling/numbness/pain: Yes reports tingling and numbness  Balance/coordination problems balance problems, but stable.   Bowel/bladder control problems: Reports using oxybutynin  5 mg in the morning extended release that helps during the day, but during the night wakes up very often to urinate  Memory, mood: Does not report cognitive problems, does not report mood problems  Gait: Please can walk without assistance  Falls: no falls since last summer in 2024.   Headaches: headaches are on the front.  3x a week. Takes 2 tylenols and sinus pill and that helps. Tizanidine  started in 2023 for tension headaches   Seizures: none reported    Has not started on high dose vitamin D  that was recently prescribed for low vitamin D  level.   ............................................................................................................................................Victoria Kelly  DIAGNOSTIC STUDIES / REVIEW OF INTERVAL MEDICAL RECORDS:    Reviewed personally, please see assessment.    .............................................................................................................................................    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    Generalized anxiety disorder 02/10/2012    H/O bariatric surgery     High blood cholesterol level 05/31/2013    Hx of trichomonal vaginitis 02/2006    On Pap smear    Hypertension     Insomnia 05/23/2013    Getting ambien through psych      Iron  deficiency anemia 05/24/2013    MS (multiple sclerosis) 11/23/2001    last flare up was 12/2019    Obesity, Class III, BMI 40-49.9 (morbid obesity) (CMS-HCC)     Obstructive sleep apnea 11/01/2011    uses cpap        ALLERGIES:    Allergies   Allergen Reactions    Cymbalta [Duloxetine] Other (See Comments)     Metal taste in mouth      Keflex [Cephalexin] Other (See Comments)     rash    Kelp Itching     Current Outpatient Medications   Medication Sig Dispense Refill    acetaminophen  (TYLENOL ) 500 MG tablet Take 2 tablets (1,000 mg total) by mouth every eight (8) hours as needed for pain. 30 tablet 0    amlodipine  (NORVASC ) 5 MG tablet Take 1 tablet (5 mg total) by mouth daily. 90 tablet 3    ascorbic acid, vitamin C , (ASCORBIC ACID) 250 mg Chew Chew.      ferrous sulfate  325 (65 FE) MG tablet Take 1 tablet (325 mg total) by mouth daily. 90 tablet 3    fluticasone  propionate (FLONASE ) 50 mcg/actuation nasal spray 2 sprays into each nostril daily. 16 g 6    modafinil  (PROVIGIL )  100 MG tablet TAKE TWO (2) TABLETS BY MOUTH EVERY DAY 120 tablet 1    olmesartan  (BENICAR ) 40 MG tablet Take 1 tablet (40 mg total) by mouth daily. 90 tablet 1    oxybutynin  (DITROPAN -XL) 5 MG 24 hr tablet Take 1 tablet (5 mg total) by mouth daily. 30 tablet 5    pantoprazole  (PROTONIX ) 40 MG tablet Take 1 tablet (40 mg total) by mouth in the morning. 90 tablet 3    PARoxetine  (PAXIL ) 30 MG tablet Take 2 tablets (60 mg total) by mouth every evening. 180 tablet 2    pregabalin  (LYRICA ) 75 MG capsule Take 1 capsule (75 mg total) by mouth nightly. 360 capsule 1    risedronate  (ACTONEL ) 5 MG tablet Take 1 tablet (5 mg total) by mouth daily before breakfast. with water on empty stomach, nothing by mouth or lie down for next 30 minutes. 90 tablet 3    teriflunomide  (AUBAGIO ) 14 mg Tab Take 1 tablet (14 mg total) by mouth every evening. 90 tablet 1    ergocalciferol -1,250 mcg, 50,000 unit, (DRISDOL ) 1,250 mcg (50,000 unit) capsule Take 1 capsule (1,250 mcg total) by mouth once a week. 12 capsule 0    tizanidine  (ZANAFLEX ) 4 MG tablet Take 2 mg at bedtime (1/2 pill) for one week and then stop       No current facility-administered medications for this visit.         Past Surgical History:    Past Surgical History:   Procedure Laterality Date    CATARACT EXTRACTION Bilateral 08/14/2003, 09/09/2003    CESAREAN SECTION  01/12/2000    COSMETIC SURGERY      DEBRIDEMENT LEG Left 09/30/1999    Spider bite    ENDOMETRIAL ABLATION  02/11/2005    HYSTERECTOMY      OOPHORECTOMY      PR LAP, GAST RESTRICT PROC, LONGITUDINAL GASTRECTOMY Midline 02/15/2014    Procedure: ROBOTIC PR LAP, GAST RESTRICT PROC, LONGITUDINAL GASTRECTOMY;  Surgeon: Fredric Jean, MD;  Location: MAIN OR [place];  Service: Gastrointestinal    PR UPPER GI ENDOSCOPY,DIAGNOSIS N/A 11/12/2020    Procedure: ESOPHAGOGASTRODUODENOSCOPY;  Surgeon: Evia Hof, MD;  Location: ENDO PROCEDURES [place];  Service: Gastroenterology    TONSILLECTOMY  1982    TOTAL ABDOMINAL HYSTERECTOMY      TUBAL LIGATION  01/12/2000       Social History:    Social History     Socioeconomic History    Marital status: Single     Spouse name: None    Number of children: 3    Years of education: None    Highest education level: None   Occupational History    Occupation: Personal assistant: unknown   Tobacco Use    Smoking status: Never     Passive exposure: Never    Smokeless tobacco: Never   Vaping Use    Vaping status: Never Used   Substance and Sexual Activity    Alcohol use: No    Drug use: No    Sexual activity: Not Currently   Other Topics Concern    Do you use sunscreen? No    Tanning bed use? No    Are you easily burned? No    Excessive sun exposure? No   Social History Narrative    Single, has 3 children, aged 67-25. Married in 1997, divorced in 2001. Lives with her boyfriend. On disability (r/t Multiple Sclerosis), previously employed as a Interior and spatial designer, bus Hospital doctor. Born and  raised in [place]. Attended [employer] in 1993.    Attended [employer] and [employer], some college credits attained.     Social Drivers of Psychologist, prison and probation services Strain: High Risk (12/09/2023)    Overall Financial Resource Strain (CARDIA)     Difficulty of Paying Living Expenses: Very hard   Food Insecurity: Food Insecurity Present (12/09/2023)    Hunger Vital Sign     Worried About Running Out of Food in the Last Year: Often true     Ran Out of Food in the Last Year: Sometimes true   Transportation Needs: Unmet Transportation Needs (12/09/2023)    PRAPARE - Therapist, art (Medical): Yes     Lack of Transportation (Non-Medical): Yes   Housing: High Risk (12/09/2023)    Housing     Within the past 12 months, have you ever stayed: outside, in a car, in a tent, in an overnight shelter, or temporarily in someone else's home (i.e. couch-surfing)?: Yes     Are you worried about losing your housing?: Patient refused       Family History:    Family History   Problem Relation Age of Onset    Lupus Mother     Hypertension Mother     Fibromyalgia Mother     Heart disease Mother         aortic valve replacement    Cataracts Mother     Diabetes Mother     Brain cancer Father         Brain tumor    Hyperlipidemia Father     Leukemia Maternal Aunt     Lupus Maternal Aunt     Lupus Maternal Aunt     Breast cancer Cousin     Cervical cancer Cousin         Review of Systems:  A 10-systems review was performed and, unless otherwise noted, declared negative by patient.    Objective:     Physical Exam:  Blood pressure 140/88, pulse 67, temperature 36.6 ??C (97.8 ??F), temperature source Temporal, height 170.2 cm (5' 7), weight (!) 135.2 kg (298 lb), not currently breastfeeding.   General Appearance: in no acute distress. Normal skin color, afebrile.  Heart and lungs: Regular heart rate. Eupneic, normal respiratory rate. Abdomen: Soft, non-tender. No peripheral  edema, peripheral pulses palpable.       NEUROLOGICAL EXAMINATION:     General:  Alert and oriented to person, place, time and situation.    Recent and remote memory intact.    PHQ-9, 7, in the last 2 weeks the patient does not report having thoughts that she would be better off dead or hurting herself in someway    Cranial Nerves:     II, III- Pupils are equal and reactive to light b/l (direct and consensual reactions). Visual Acuity: 20/20 B.l, with glasses   No visual field defect.  No discrete temporal optic disc pallor bilaterally  III, IV, VI- extra ocular movements are intact, No ptosis, no diplopia, no nystagmus.  V- sensation of the face intact b/l.   VII- face symmetrical, no facial droop, normal facial movements with smile/grimace  VIII- Hearing grossly intact. Weber test: sound is symmetrical with no lateralization.  IX and X- symmetric palate contraction, no dysarthria, no dysphagia.  XI- Full shoulder shrug bilaterally; no wasting, normal tone and strength of sternocleidomastoid muscles bilaterally.  XII- No tongue atrophy, no tongue fasciculations; tongue protrudes  midline, full range of movements of the tongue.    Neck flexion normal.    Motor Exam:     Normal bulk. Fasciculations not observed.     Muscle strength:    Muscles UEs  LEs    R L  R L   Deltoids 5/5 5/5 Hip flexors  5/5 5/5   Biceps 5/5 5/5 Hip extensors 5/5 5/5   Triceps 5/5 5/5 Knee flexors 5/5 5/5   Hand grip 5/5 5/5 Knee extensors 5/5 5/5   Wrist flexors 5/5 5/5 Foot dorsal flexors 5/5 5/5   Wrist extensors 5/5 5/5 Foot plantar flexors 5/5 5/5   Finger flexors 5/5 5/5      Finger extensors 5/5 5/5             Reflexes R L   Biceps +2 +2   Brachioradialis  +2 +2   Triceps +2 +2   Patella +3 +3   Achilles +2 +2     Normal tone b/l. Negative Babinski sign bilaterally.    Sensory system:  Superficial light touch sensation:WNL  Vibration sense: Mild decrease in vibrations on left foot, within normal limit in other limbs  Position sense:WNL  Pinprick test for pain sensation:WNL  (WNL= within normal limits; UE= upper extremities; LE= lower extremities;  R= right, L= left).    Cerebellar/Coordination:  Rapid alternating movements, finger-to-nose and heel-to-shin  bilaterally demonstrate no abnormalities. No limb ataxia bilaterally. No gait ataxia. Romberg: Mildly positive performs a tandem gait with mild difficulty    Gait: Normal stride, base and  armswing. Able to walk on toes, heels without difficulty. Gait influenced by obesity.    Timed 25 foot walk:unassisted 5.58 sec  .........................................................................................................................................Victoria Kelly    VISIT SUMMARY:  [name], a 53 y.o. African American right handed female  presented with MS.    personally spent 40 minutes face-to-face and non-face-to-face in the care of this patient, which includes all pre, intra, and post visit time on the date of service.  All documented time was specific to the E/M visit and does not include any procedures that may have been performed.  Thank you for the opportunity to contribute to the care of [name].

## 2024-02-06 ENCOUNTER — Inpatient Hospital Stay: Admit: 2024-02-06 | Discharge: 2024-02-07 | Payer: Medicare (Managed Care)

## 2024-02-08 NOTE — Unmapped (Signed)
 [facility] Clinical Assessment & Refill Coordination Note    Victoria Kelly, DOB: Oct 19, 1970  Phone: [phone] (work)    All above HIPAA information was verified with patient.     Was a Nurse, learning disability used for this call? No    Specialty Medication(s):   Neurology: teriflunomide     Current Outpatient Medications   Medication Sig Dispense Refill    acetaminophen (TYLENOL) 500 MG tablet Take 2 tablets (1,000 mg total) by mouth every eight (8) hours as needed for pain. 30 tablet 0    amlodipine (NORVASC) 5 MG tablet Take 1 tablet (5 mg total) by mouth daily. 90 tablet 3    ascorbic acid, vitamin C, (ASCORBIC ACID) 250 mg Chew Chew.      ergocalciferol-1,250 mcg, 50,000 unit, (DRISDOL) 1,250 mcg (50,000 unit) capsule Take 1 capsule (1,250 mcg total) by mouth once a week. 12 capsule 0    ferrous sulfate 325 (65 FE) MG tablet Take 1 tablet (325 mg total) by mouth daily. 90 tablet 3    fluticasone propionate (FLONASE) 50 mcg/actuation nasal spray 2 sprays into each nostril daily. 16 g 6    modafinil (PROVIGIL) 100 MG tablet TAKE TWO (2) TABLETS BY MOUTH EVERY DAY 120 tablet 1    olmesartan (BENICAR) 40 MG tablet Take 1 tablet (40 mg total) by mouth daily. 90 tablet 1    oxybutynin (DITROPAN-XL) 5 MG 24 hr tablet Take 1 tablet (5 mg total) by mouth daily. 30 tablet 5    pantoprazole (PROTONIX) 40 MG tablet Take 1 tablet (40 mg total) by mouth in the morning. 90 tablet 3    PARoxetine (PAXIL) 30 MG tablet Take 2 tablets (60 mg total) by mouth every evening. 180 tablet 2    pregabalin (LYRICA) 75 MG capsule Take 1 capsule (75 mg total) by mouth nightly. 360 capsule 1    risedronate (ACTONEL) 5 MG tablet Take 1 tablet (5 mg total) by mouth daily before breakfast. with water on empty stomach, nothing by mouth or lie down for next 30 minutes. 90 tablet 3    teriflunomide (AUBAGIO) 14 mg Tab Take 1 tablet (14 mg total) by mouth every evening. 90 tablet 1    tizanidine (ZANAFLEX) 4 MG tablet Take 2 mg at bedtime (1/2 pill) for one week and then stop       No current facility-administered medications for this visit.        Changes to medications: Victoria Kelly reports no changes at this time.    Medication list has been reviewed and updated in Epic: Yes    Allergies   Allergen Reactions    Cymbalta [Duloxetine] Other (See Comments)     Metal taste in mouth      Keflex [Cephalexin] Other (See Comments)     rash    Kelp Itching       Changes to allergies: No    Allergies have been reviewed and updated in Epic: Yes    SPECIALTY MEDICATION ADHERENCE     teriflunomide 14 mg: 0 days of medicine on hand       Medication Adherence    Patient reported X missed doses in the last month: 2  Specialty Medication: teriflunomide 14mg    Other adherence tool: Patient has a bag of medicines that she takes before bed; has routine   Support network for adherence: family member          Specialty medication(s) dose(s) confirmed: Regimen is correct and unchanged.  Are there any concerns with adherence? Yes: We have not filled teriflunomide  for this patient since December 2024    Adherence counseling provided?  Not needed, patient explained that she had a stock pile of medicine so she did not want to re-order until she had taken the supply she had on hand.  She did not miss any doses until she ran out of pills 2 days ago.    CLINICAL MANAGEMENT AND INTERVENTION      Clinical Benefit Assessment:    Do you feel the medicine is effective or helping your condition? Yes    Clinical Benefit counseling provided? Not needed    Adverse Effects Assessment:    Are you experiencing any side effects? No    Are you experiencing difficulty administering your medicine? No    Quality of Life Assessment:    Quality of Life    Rheumatology  Oncology  Dermatology  Cystic Fibrosis          How many days over the past month did your MS  keep you from your normal activities? For example, brushing your teeth or getting up in the morning. 0    Have you discussed this with your provider? Not needed    Acute Infection Status:    Acute infections noted within Epic:  No active infections    Patient reported infection: None    Therapy Appropriateness:    Is therapy appropriate based on current medication list, adverse reactions, adherence, clinical benefit and progress toward achieving therapeutic goals? Yes, therapy is appropriate and should be continued     Clinical Intervention:    Was an intervention completed as part of this clinical assessment? No    DISEASE/MEDICATION-SPECIFIC INFORMATION      N/A    Multiple Sclerosis: Have you experienced any flares in the last month? No  Has this been reported to your provider? Not applicable  What was the outcome of the flare? Not applicable    PATIENT SPECIFIC NEEDS     Does the patient have any physical, cognitive, or cultural barriers? No    Is the patient high risk? No    Does the patient require physician intervention or other additional services (i.e., nutrition, smoking cessation, social work)? No    Does the patient have an additional or emergency contact listed in their chart? Yes    SOCIAL DETERMINANTS OF HEALTH     At the [facility], we have learned that life circumstances - like trouble affording food, housing, utilities, or transportation can affect the health of many of our patients.   That is why we wanted to ask: are you currently experiencing any life circumstances that are negatively impacting your health and/or quality of life? Patient declined to answer    Social Drivers of Health     Food Insecurity: Food Insecurity Present (12/09/2023)    Hunger Vital Sign     Worried About Running Out of Food in the Last Year: Often true     Ran Out of Food in the Last Year: Sometimes true   Tobacco Use: Low Risk  (02/04/2024)    Patient History     Smoking Tobacco Use: Never     Smokeless Tobacco Use: Never     Passive Exposure: Never   Transportation Needs: Unmet Transportation Needs (12/09/2023)    PRAPARE - Transportation     Lack of Transportation (Medical): Yes     Lack of Transportation (Non-Medical): Yes   Alcohol Use: Not At  Risk (04/30/2021)    Alcohol Use     How often do you have a drink containing alcohol?: Never     How many drinks containing alcohol do you have on a typical day when you are drinking?: 1 - 2     How often do you have 5 or more drinks on one occasion?: Never   Housing: High Risk (12/09/2023)    Housing     Within the past 12 months, have you ever stayed: outside, in a car, in a tent, in an overnight shelter, or temporarily in someone else's home (i.e. couch-surfing)?: Yes     Are you worried about losing your housing?: Patient refused   Physical Activity: Not on file   Utilities: High Risk (12/09/2023)    Utilities     Within the past 12 months, have you been unable to get utilities (heat, electricity) when it was really needed?: Yes   Stress: Not on file   Interpersonal Safety: Not on file   Substance Use: Low Risk  (11/28/2023)    Substance Use     In the past year, how often have you used prescription drugs for non-medical reasons?: Never     In the past year, how often have you used illegal drugs?: Never     In the past year, have you used any substance for non-medical reasons?: No   Intimate Partner Violence: Not At Risk (04/30/2021)    Humiliation, Afraid, Rape, and Kick questionnaire     Fear of Current or Ex-Partner: No     Emotionally Abused: No     Physically Abused: No     Sexually Abused: No   Social Connections: Not on file   Financial Resource Strain: High Risk (12/09/2023)    Overall Financial Resource Strain (CARDIA)     Difficulty of Paying Living Expenses: Very hard   Depression: Not at risk (04/11/2023)    PHQ-2     PHQ-2 Score: 1   Internet Connectivity: Internet connectivity concern identified (12/09/2023)    Internet Connectivity     Do you have access to internet services: No     How do you connect to the internet: Not on file     Is your internet connection strong enough for you to watch video on your device without major problems?: Not on file     Do you have enough data to get through the month?: Not on file     Does at least one of the devices have a camera that you can use for video chat?: Not on file   Health Literacy: Not on file       Would you be willing to receive help with any of the needs that you have identified today? Not applicable       SHIPPING     Specialty Medication(s) to be Shipped:   Neurology: teriflunomide     Other medication(s) to be shipped: No additional medications requested for fill at this time     Changes to insurance: No    Cost and Payment: Patient has a $0 copay, payment information is not required.    Delivery Scheduled: Yes, Expected medication delivery date: 4/24.     Medication will be delivered via Same Day Courier to the confirmed prescription address in [facility].    The patient will receive a drug information handout for each medication shipped and additional FDA Medication Guides as required.  Verified that patient has previously received a Conservation officer, historic buildings and a  Notice of Chief Technology Officer.    The patient or caregiver noted above participated in the development of this care plan and knows that they can request review of or adjustments to the care plan at any time.      All of the patient's questions and concerns have been addressed.    Isidore Mares, PharmD   [facility] Specialty Pharmacist

## 2024-02-09 ENCOUNTER — Ambulatory Visit: Admit: 2024-02-09 | Discharge: 2024-02-10 | Payer: Medicare (Managed Care)

## 2024-02-09 DIAGNOSIS — Z6841 Body Mass Index (BMI) 40.0 and over, adult: Principal | ICD-10-CM

## 2024-02-09 DIAGNOSIS — E559 Vitamin D deficiency, unspecified: Principal | ICD-10-CM

## 2024-02-09 DIAGNOSIS — E66813 Class 3 severe obesity with body mass index (BMI) of 40.0 to 44.9 in adult, unspecified obesity type, unspecified whether serious comorbidity present: Principal | ICD-10-CM

## 2024-02-09 MED ORDER — ERGOCALCIFEROL (VITAMIN D2) 1,250 MCG (50,000 UNIT) CAPSULE
ORAL_CAPSULE | ORAL | 0 refills | 84.00 days | Status: CP
Start: 2024-02-09 — End: 2024-05-09

## 2024-02-09 MED FILL — TERIFLUNOMIDE 14 MG TABLET: ORAL | 30 days supply | Qty: 30 | Fill #3

## 2024-02-09 NOTE — Unmapped (Addendum)
 Please call [phone] to schedule your spine clinic appointment.    I will refer you to the obesity medicine clinic

## 2024-02-09 NOTE — Unmapped (Signed)
 [facility]     Reason for visit: Follow up    Questions / Concerns that need to be addressed: None    Screening BP- 130/78 P 59    Omron BPs (complete if screening BP has a systolic  > 130 or diastolic > 80)  BP#1    BP#2   BP#3     Average BP   (please note this as a comment in vitals)     PTHomeBP     Diabetes:  Regularly checking blood sugars?: no  If yes, when? Complete log for past 7 days  Date Before Breakfast After Breakfast Before Lunch After Lunch Before Dinner After Dinner Before Bed                                                                                                                                     Dexcom or Libre CGM in use? If so, pull appropriate reporting through portal (Dexcom) or EPIC order Jerrilyn Moras).    HCDM reviewed and updated in Epic:    We are working to make sure all of our patients??? wishes are updated in Epic and part of that is documenting a Environmental health practitioner for each patient  A Health Care Decision Maker is someone you choose who can make health care decisions for you if you are not able - who would you most want to do this for you????  is already up to date.    HCDM (patient stated preference): Poteat,Chanel - Daughter - [phone]    BPAs completed:  N/A     Annual Screenings:   Tobacco  __________________________________________________________________________________________    SCREENINGS COMPLETED IN FLOWSHEETS      AUDIT       PHQ2       PHQ9          [name]7       COPD Assessment       Falls Risk

## 2024-02-09 NOTE — Unmapped (Signed)
 Internal Medicine Clinic Visit    Reason for visit: Follow up back pain    A/P:         1. Class 3 severe obesity with body mass index (BMI) of 40.0 to 44.9 in adult, unspecified obesity type, unspecified whether serious comorbidity present    2. Vitamin D deficiency, unspecified      Obesity - Hx Gastric Sleeve  Had sleeve ectomy back in 2015.  Has been taking vitamins and supplements as prescribed. Interested in further weight loss. Has struggled to lose weight despite regular exercise and changing diet. Does not have diabetes. Unable to obtain Wegovy due to insurance issues (can re-assess at future appts if insurance starts to approve GLP-1 for weight loss).  - continue MVM, iron supplement, vitamin D   - no weight loss with metformin  - bupropion not tolerated due to worsening anxiety/agitation  - phentermine not tried due to uncontrolled BP  - referral to obesity medicine clinic placed    HTN  Blood pressure controlled in clinic today.  - continue olmesartan 40 mg daily  - continue amlodipine 5 mg daily    [name]  Osteoporosis  Sustained [name] in June 2024 after mechanical fall in the shower. DEXA with low bone mass, started on bisphosphonate.  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 Has occasional LLE radicular symptoms though this has improved over time. Ambulatory today without strength deficit on exam.  - Spine center has reached out via MyChart to schedule appt, will provide phone # again today    Hyperparathyroidism  Elevated PTH with normal phosphorus, calcium. 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 [name] may be contributing but less so given normal phosphorus.  - Endocrine referral placed    2.6 cm L Adrenal Nodule  Stable on serial imaging. Endocrine e-Consulted in Sep 2024 for osteoporosis, adrenal nodule in pt with hx of gastric sleeve. Biochemical work-up sent, endocrine referral placed.     Anxiety/Depression - Stress  Previously followed with psychiatry. Notes significant stress surrounding living situation and family members. Has learned to cope with stress by separating herself from stressful situations. States that her anxiety gets uncontrolled when she does not take her paroxetine.   - Offered referral to counseling, pt declines at this time  - Continue paroxetine 60 mg daily (encouraged daily use)  - Bupropion previously not tolerated due to worsening anxiety/irritability    Leg Swelling  Was seen in clinic several times for R>L swelling. Initial exam with BL chronic venous stasis changes. Exam not consistent with cellulitis. PVL negative for DVT. Seems to have improved with elevation as well as 1 week trial of lasix. Suspect her swelling may be multifactorial in the setting of venous stasis, [name], and possible component of lymphedema.   - encouraged leg elevation, recommended compression stockings  - recommended reduction of Lyrica dose as this could be causing some swelling    [name]  Baseline creatinine 1-1.2 over the past 10 years.  No significant proteinuria. Renal US  with 0.9 cm mass. No monoclonal protein  appreciated on SPEP immunofixation.  - seen by nephrology, [name] unclear etiology however suspect familial component given significant family history  - plan to continue BP control, avoid NSAIDs/nephrotoxins    Multiple Sclerosis: Diagnosed in 2003. Follows with [facility].   - Continue Teriflunomide, Lyrica, Oxybutynin per neuro    Vitamin D deficiency: Vitamin D3 50,000 units weekly for 6 months and than 4,000 units daily.  - continue supplementation GERD:  - continue protonix      OSA  Patient reports history of OSA, has CPAP at home.  - Encouraged regular CPAP use     HCM:   Mammogram: Sep 2024  Colonoscopy: FIT testing ordered  Zoster Vaccines: Sent to pharmacy   Lipids: last checked 2016. ASCVD was 0.8%  Hgb A1c: 5.5% Nov 2023  HepC screen: 10/22/22    Return in about 3 months (around 05/10/2024).    Staffed with Dr. Broadus Canes, discussed    __________________________________________________________    HPI:    Seen in MS clinic. Weaning off tizanidine. Currently using as needed, 3x per week  Taking Lyrica    Does have shooting left-sided leg pain occasionally, especially with laying on left side. Used to happen everyday but not as frequent now    Discussed bupriopion use - did not agree with her. Felt like she was more on edge and anxious. Stopped taking after 10 days or so.    Open to re-establishing with obesity medicine clinic      __________________________________________________________      Medications:  Reviewed in EPIC  __________________________________________________________    Physical Exam:   Vital Signs:  Vitals:    02/09/24 1318   BP: 130/78   BP Site: L Wrist   BP Position: Sitting   BP Cuff Size: Medium   Pulse: 59   Resp: 16   Temp: 36.3 ??C (97.4 ??F)   TempSrc: Temporal   SpO2: 99%   Weight: (!) 131.5 kg (290 lb)   Height: 170.2 cm (5' 7)                      Gen: Well appearing, NAD, obese, ambulatory  CV: RRR  Lungs: CTAB  Extremities: Mild pitting LE edema bilaterally  Neuro: 5/5 LE strength BL        Medication adherence and barriers to the treatment plan have been addressed. Opportunities to optimize healthy behaviors have been discussed. Patient / caregiver voiced understanding.

## 2024-02-15 NOTE — Unmapped (Signed)
 Hello, I'm Dr. Roddy Citizen - I am one of the doctors covering for Dr. Lydia Sams while she is on leave. 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 How are you doing? How has PT been going?

## 2024-02-16 NOTE — Unmapped (Signed)
 Immediately after or during the visit, I reviewed with the resident the medical history and the resident???s findings on physical examination.  I discussed with the resident the patient???s diagnosis and concur with the treatment plan as documented in the resident note. De-Vaughn Sima Matas, MD

## 2024-02-21 MED ORDER — PREGABALIN 75 MG CAPSULE
ORAL_CAPSULE | Freq: Two times a day (BID) | ORAL | 2 refills | 0.00000 days
Start: 2024-02-21 — End: ?

## 2024-02-22 DIAGNOSIS — I10 Essential (primary) hypertension: Principal | ICD-10-CM

## 2024-02-22 MED ORDER — OLMESARTAN 40 MG TABLET
ORAL_TABLET | Freq: Every day | ORAL | 11 refills | 90.00000 days
Start: 2024-02-22 — End: ?

## 2024-02-22 NOTE — Unmapped (Signed)
 Last visit 01/30/2024 , Continue pregabalin 75 mg at bedtime , no future visit scheduled , recommended to be seen in 

## 2024-02-22 NOTE — Unmapped (Signed)
 Last ordered: 1 month ago (01/09/2024) by Clifm Dame, MD   With 3 refills

## 2024-02-24 MED ORDER — PREGABALIN 75 MG CAPSULE
ORAL_CAPSULE | Freq: Two times a day (BID) | ORAL | 2 refills | 90.00000 days
Start: 2024-02-24 — End: ?

## 2024-02-29 NOTE — Unmapped (Signed)
 Received request for medication refill on Calcitonin-salmon nasal spray. Will route to provider. Not on active medication list and medication was discontinued in 11/2023.

## 2024-03-05 NOTE — Unmapped (Signed)
 [facility] Refill Coordination Note    Specialty Medication(s) to be Shipped:   Neurology: teriflunomide    Other medication(s) to be shipped: No additional medications requested for fill at this time     Victoria Kelly, DOB: Feb 22, 1971  Phone: [phone] (work)      All above HIPAA information was verified with patient.     Was a Nurse, learning disability used for this call? No    Completed refill call assessment today to schedule patient's medication shipment from the [facility]  [phone]).  All relevant notes have been reviewed.     Specialty medication(s) and dose(s) confirmed: Regimen is correct and unchanged.   Changes to medications: Victoria Kelly reports no changes at this time.  Changes to insurance: No  New side effects reported not previously addressed with a pharmacist or physician: None reported  Questions for the pharmacist: No    Confirmed patient received a Conservation officer, historic buildings and a Surveyor, mining with first shipment. The patient will receive a drug information handout for each medication shipped and additional FDA Medication Guides as required.       DISEASE/MEDICATION-SPECIFIC INFORMATION        N/A    SPECIALTY MEDICATION ADHERENCE     Medication Adherence    Patient reported X missed doses in the last month: 0  Specialty Medication: teriflunomide 14 mg Tab (AUBAGIO)  Patient is on additional specialty medications: No  Patient is on more than two specialty medications: No  Any gaps in refill history greater than 2 weeks in the last 3 months: no  Demonstrates understanding of importance of adherence: yes   Other adherence tool: Patient has a bag of medicines that she takes before bed; has routine   Support network for adherence: family member              Were doses missed due to medication being on hold? No     teriflunomide 14 mg Tab (AUBAGIO)     : 7  days of medicine on hand         REFERRAL TO PHARMACIST     Referral to the pharmacist: Not needed      [facility]     Shipping address confirmed in Epic.     Cost and Payment: Patient has a $0 copay, payment information is not required.    Delivery Scheduled: Yes, Expected medication delivery date: 03/07/24 .     Medication will be delivered via Same Day Courier to the prescription address in Epic WAM.    Victoria Kelly   [facility]  Specialty Technician

## 2024-03-07 MED FILL — TERIFLUNOMIDE 14 MG TABLET: ORAL | 30 days supply | Qty: 30 | Fill #4

## 2024-03-13 ENCOUNTER — Other Ambulatory Visit

## 2024-03-15 ENCOUNTER — Other Ambulatory Visit

## 2024-03-19 ENCOUNTER — Ambulatory Visit (LOCAL_COMMUNITY_HEALTH_CENTER): Payer: Self-pay

## 2024-03-19 ENCOUNTER — Other Ambulatory Visit

## 2024-03-19 DIAGNOSIS — Z111 Encounter for screening for respiratory tuberculosis: Secondary | ICD-10-CM

## 2024-03-21 ENCOUNTER — Ambulatory Visit (LOCAL_COMMUNITY_HEALTH_CENTER)

## 2024-03-21 DIAGNOSIS — Z111 Encounter for screening for respiratory tuberculosis: Secondary | ICD-10-CM

## 2024-03-21 LAB — TB SKIN TEST
Induration: 0 mm
TB Skin Test: NEGATIVE

## 2024-03-22 ENCOUNTER — Other Ambulatory Visit

## 2024-04-04 NOTE — Unmapped (Signed)
 [facility] Refill Coordination Note    Specialty Medication(s) to be Shipped:   Neurology: teriflunomide    Other medication(s) to be shipped: No additional medications requested for fill at this time     Victoria Kelly, DOB: 29-Dec-1970  Phone: [phone] (work)      All above HIPAA information was verified with patient.     Was a Nurse, learning disability used for this call? No    Completed refill call assessment today to schedule patient's medication shipment from the [facility] Delivery Pharmacy  [phone]).  All relevant notes have been reviewed.     Specialty medication(s) and dose(s) confirmed: Regimen is correct and unchanged.   Changes to medications: Victoria Kelly reports no changes at this time.  Changes to insurance: No  New side effects reported not previously addressed with a pharmacist or physician: None reported  Questions for the pharmacist: No    Confirmed patient received a Conservation officer, historic buildings and a Surveyor, mining with first shipment. The patient will receive a drug information handout for each medication shipped and additional FDA Medication Guides as required.       DISEASE/MEDICATION-SPECIFIC INFORMATION        N/A    SPECIALTY MEDICATION ADHERENCE     Medication Adherence    Patient reported X missed doses in the last month: 0  Specialty Medication: teriflunomide 14 mg Tab (AUBAGIO)  Patient is on additional specialty medications: No  Patient is on more than two specialty medications: No  Any gaps in refill history greater than 2 weeks in the last 3 months: no  Demonstrates understanding of importance of adherence: yes   Other adherence tool: Patient has a bag of medicines that she takes before bed; has routine   Support network for adherence: family member              Were doses missed due to medication being on hold? No     teriflunomide 14 mg Tab (AUBAGIO)     : 7  days of medicine on hand         REFERRAL TO PHARMACIST     Referral to the pharmacist: Not needed      [facility]     Shipping address confirmed in Epic.     Cost and Payment: Patient has a $0 copay, payment information is not required.    Delivery Scheduled: Yes, Expected medication delivery date: 04/06/24 .     Medication will be delivered via Same Day Courier to the prescription address in Epic WAM.    Victoria Kelly   [facility]  Specialty Technician

## 2024-04-06 MED FILL — TERIFLUNOMIDE 14 MG TABLET: ORAL | 30 days supply | Qty: 30 | Fill #5

## 2024-04-09 DIAGNOSIS — F32A Depression, unspecified depression type: Principal | ICD-10-CM

## 2024-04-09 MED ORDER — PAROXETINE 30 MG TABLET
ORAL_TABLET | Freq: Every evening | ORAL | 2 refills | 90.00000 days | Status: CP
Start: 2024-04-09 — End: 2025-01-04

## 2024-04-11 DIAGNOSIS — F32A Depression, unspecified depression type: Principal | ICD-10-CM

## 2024-04-11 DIAGNOSIS — Z9884 Bariatric surgery status: Principal | ICD-10-CM

## 2024-04-11 MED ORDER — PAROXETINE 30 MG TABLET
ORAL_TABLET | Freq: Every evening | ORAL | 2 refills | 90.00000 days | Status: CP
Start: 2024-04-11 — End: 2025-01-06

## 2024-04-11 MED ORDER — FERROUS SULFATE 325 MG (65 MG IRON) TABLET
ORAL_TABLET | Freq: Every day | ORAL | 3 refills | 90.00000 days | Status: CP
Start: 2024-04-11 — End: ?

## 2024-04-11 NOTE — Unmapped (Signed)
 Med refill request

## 2024-04-23 MED ORDER — MODAFINIL 100 MG TABLET
ORAL_TABLET | Freq: Every day | ORAL | 2 refills | 0.00000 days
Start: 2024-04-23 — End: ?

## 2024-04-24 MED ORDER — MODAFINIL 100 MG TABLET
ORAL_TABLET | Freq: Every day | ORAL | 2 refills | 60.00000 days | Status: CP
Start: 2024-04-24 — End: ?

## 2024-04-27 DIAGNOSIS — G35 Multiple sclerosis: Principal | ICD-10-CM

## 2024-04-27 MED ORDER — TERIFLUNOMIDE 14 MG TABLET
ORAL_TABLET | Freq: Every evening | ORAL | 1 refills | 90.00000 days | Status: CP
Start: 2024-04-27 — End: ?
  Filled 2024-05-15: qty 30, 30d supply, fill #0

## 2024-04-27 NOTE — Unmapped (Signed)
 Request received via interface.     Provider: Dr. Lesta    Last Visit Date: 07/22/2023  Next Visit Date: Visit date not found    Lab Results   Component Value Date    Hepatitis C Ab Nonreactive 10/21/2022    HIV Antigen/Antibody Combo Nonreactive 02/10/2012        Results for orders placed during the hospital encounter of 11/29/23    MRI Brain Wo Contrast    Narrative  EXAM: Magnetic resonance imaging, brain, without contrast material.  ACCESSION: [id number] UN      CLINICAL INDICATION: 53 years old Female with MS  - G35 - Multiple sclerosis (CMS - HCC)    COMPARISON: MRI brain with and without contrast 05/26/2021    TECHNIQUE: Multiplanar, multisequence MR imaging of the brain was performed without I.V. contrast.    FINDINGS:  There are multiple foci of T2/FLAIR hyperintensity in the infratentorial, juxtacortical and periventricular white matter which appear unchanged. Some of the lesions demonstrate corresponding T1 black holes consistent with axonal loss. No new lesions visualized.    The optic nerves are normal in appearance. No diffuse brain volume loss. Ventricles are normal in size.    There is no evidence of intracranial hemorrhage, acute infarct, or mass.    Impression  Multiple white matter lesions in a distribution consistent with multiple sclerosis, unchanged compared to prior MRI performed 05/26/2021.      Results for orders placed during the hospital encounter of 11/29/23    MRI Thoracic Spine Wo Contrast    Narrative  EXAM: Magnetic resonance imaging, spinal canal and contents, thoracic, without contrast material.  DATE: 11/29/2023 9:47 AM  ACCESSION: [id number] UN  DICTATED: 11/29/2023 9:53 AM  INTERPRETATION LOCATION: [facility]    CLINICAL INDICATION: 53 years old Female with MS  - G35 - Multiple sclerosis (CMS - HCC)    COMPARISON: MRI 05/26/2021 and x-ray 04/05/2023    TECHNIQUE: Multiplanar MRI was performed through the thoracic spine without contrast administration    FINDINGS:  Bone marrow signal intensity is normal. Moderate wedge deformity of L2. Alignment is normal. Multilevel disc space height loss, most notable at T11-T12 with associated Modic 2 endplate changes. No significant spinal canal or neural foraminal narrowing.    Slightly enlargement of T2-weighted hyperintense lesion seen in the right lateral cord at T3 (6:7).    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    The paraspinal tissues are within normal limits.    Impression  - The focus of T2 hyperintensity at the level of T3 is slightly larger when compared to prior.  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  - Stable wedge deformity at L1.      Results for orders placed during the hospital encounter of 11/29/23    MRI Cervical Spine Wo Contrast    Narrative  EXAM: Magnetic resonance imaging, spinal canal and contents, cervical without contrast material.  DATE: 11/29/2023 9:47 AM  ACCESSION: [id number] UN  DICTATED: 11/29/2023 10:59 AM  INTERPRETATION LOCATION: [facility]    CLINICAL INDICATION: 53 years old Female with MS  - G35 - Multiple sclerosis (CMS - HCC)    COMPARISON: Cervical MRI 05/26/2021    TECHNIQUE: Multiplanar multisequence MRI was performed through the cervical spine without intravenous contrast.    FINDINGS:  Bone marrow signal intensity is heterogeneous.    The vertebral bodies are normally aligned. Multilevel disc space height loss associated osteophytosis, notably at C4-C5 and C5-C6.    Disc bulge at C4-C5 results in mild spinal canal  narrowing, progressed from prior. Advanced left-sided joint arthropathy contributes to mild left-sided neural foraminal narrowing at this level.    Disc bulge with left subarticular protrusion at C5-C6 without significant spinal canal narrowing. Moderate facet arthropathy results in mild bilateral neural foraminal stenosis.    Similar size of right lateral T2 hyperintensity at the level of C2 and C3 (5:8).    The paraspinal tissues are within normal limits.    Impression  - Stable T2 hyperintense lesions in the right lateral cord at C2 and C3.  - Progressed multilevel degenerative spondylosis with mild spinal canal narrowing at C4-C5.

## 2024-05-11 NOTE — Unmapped (Signed)
 [facility] Refill Coordination Note    Specialty Medication(s) to be Shipped:   Neurology: teriflunomide     Other medication(s) to be shipped: No additional medications requested for fill at this time     Victoria Kelly, DOB: 05-31-1971  Phone: [phone] (work)      All above HIPAA information was verified with patient.     Was a Nurse, learning disability used for this call? No    Completed refill call assessment today to schedule patient's medication shipment from the [facility]  [phone]).  All relevant notes have been reviewed.     Specialty medication(s) and dose(s) confirmed: Regimen is correct and unchanged.   Changes to medications: Victoria Kelly reports no changes at this time.  Changes to insurance: No  New side effects reported not previously addressed with a pharmacist or physician: None reported  Questions for the pharmacist: No    Confirmed patient received a Conservation officer, historic buildings and a Surveyor, mining with first shipment. The patient will receive a drug information handout for each medication shipped and additional FDA Medication Guides as required.       DISEASE/MEDICATION-SPECIFIC INFORMATION        N/A    SPECIALTY MEDICATION ADHERENCE     Medication Adherence    Patient reported X missed doses in the last month: 0  Specialty Medication: teriflunomide  14 mg Tab (AUBAGIO )  Patient is on additional specialty medications: No  Patient is on more than two specialty medications: No  Any gaps in refill history greater than 2 weeks in the last 3 months: no  Demonstrates understanding of importance of adherence: yes   Other adherence tool: Patient has a bag of medicines that she takes before bed; has routine   Support network for adherence: family member              Were doses missed due to medication being on hold? No     teriflunomide  14 mg Tab (AUBAGIO )     : 7  days of medicine on hand         REFERRAL TO PHARMACIST     Referral to the pharmacist: Not needed      [facility]     Shipping address confirmed in Epic.     Cost and Payment: Patient has a $0 copay, payment information is not required.    Delivery Scheduled: Yes, Expected medication delivery date: 05/15/24 .     Medication will be delivered via Same Day Courier to the prescription address in Epic WAM.    Tawni Daring   [facility]  Specialty Technician

## 2024-05-18 NOTE — Unmapped (Signed)
 [facility]-- Medication Adherence  CHART REVIEW    To patients reading this note:  Based on information provided by your insurance, the primary purpose of this chart review is to determine the need and/or appropriateness of statin therapy. Any suggested changes are intended to enhance the overall care you receive.      Population[place]  [employer]    A chart review was performed based on patient's history of diabetes  and third party payer claims data.     Based on chart review, the patient meets the following exclusion criteria:   Patient on metformin  for weight loss and not diabetes.        Darryle Almarie Demark, CPP , PharmD  [facility] ([facility])

## 2024-05-21 DIAGNOSIS — G35 Multiple sclerosis: Principal | ICD-10-CM

## 2024-05-21 DIAGNOSIS — Z1231 Encounter for screening mammogram for malignant neoplasm of breast: Principal | ICD-10-CM

## 2024-05-21 DIAGNOSIS — F32A Depression, unspecified depression type: Principal | ICD-10-CM

## 2024-05-21 DIAGNOSIS — Z5181 Encounter for therapeutic drug level monitoring: Principal | ICD-10-CM

## 2024-05-21 DIAGNOSIS — I151 Hypertension secondary to other renal disorders: Principal | ICD-10-CM

## 2024-05-21 MED ORDER — TIZANIDINE 4 MG TABLET
ORAL_TABLET | 1 refills | 0.00000 days
Start: 2024-05-21 — End: ?

## 2024-05-21 MED ORDER — PAROXETINE 30 MG TABLET
ORAL_TABLET | Freq: Every evening | ORAL | 2 refills | 90.00000 days | Status: CP
Start: 2024-05-21 — End: 2025-02-15

## 2024-05-21 MED ORDER — AMLODIPINE 5 MG TABLET
ORAL_TABLET | Freq: Every day | ORAL | 3 refills | 90.00000 days | Status: CP
Start: 2024-05-21 — End: 2025-05-21

## 2024-05-21 NOTE — Unmapped (Signed)
 Internal Medicine Clinic Visit    Reason for visit: Routine follow up    A/P:    1. Encounter for mammogram to establish baseline mammogram    2. Depression, unspecified depression type    3. Hypertension secondary to other renal disorders      Assessment & Plan  Multiple sclerosis with recent flare and associated symptoms  Recent MS flare with balance issues and numbness in right toes. Currently on Aubagio . Tizanidine  discontinued, reason unclear. Reports tension headaches and difficulty relaxing.  - Encourage follow-up with neurologist for MS flare, medication management, and tension headaches.  - Discuss potential need for tizanidine  or alternative treatments for tension headaches and muscle relaxation.    Chronic venous stasis with lower extremity edema  Chronic venous stasis with edema, skin changes, and swelling. Amlodipine  may exacerbate edema, but symptoms predate its use.  - Recommend daily use of compression stockings.  - Continue leg elevation.    Hypertension  Hypertension managed with amlodipine  and olmesartan . Amlodipine  may contribute to leg swelling.  - Refill amlodipine  prescription  - Continue olmesartan      Chronic kidney disease  Chronic kidney disease with nephrology follow-up scheduled. Last Cr 1.04 in April 2025.  - Continue current medication regimen.    Depression  Depression managed with Paxil  60 mg daily since MS diagnosis.  - Refill Paxil  prescription.    Tension headaches  Tension headaches previously managed with tizanidine , which was discontinued. Reports difficulty relaxing and poor sleep, possibly related to tension headaches.  - Encourage follow-up with neurologist to discuss headache management and potential medication adjustments.    HCM  - mammogram ordered  - encouraged shingles vaccination at local pharmacy     Return in about 3 months (around 08/21/2024).    Staffed with Dr. Leonie, seen and discussed    Gus Stanton, MD   [facility] Internal Medicine, PGY-3    __________________________________________________________    HPI:    59F w PMH Multiple Sclerosis, [name], [name], [name], Hyperparathyroidism, HTN, Osteoporosis, [name], [name], and history of gastric sleeve presents today for routine follow up. Former patient of [employer]. Last Seen April 2025.     History of Present Illness  She reports balance issues and numbness in the toes of her right foot, persisting for months, and believes she may be having a flare-up of her multiple sclerosis. She takes Aubagio  once daily at night, which helps manage her MS symptoms during the day. She also experiences tension headaches and uses Tylenol  for pain management. She was previously prescribed tizanidine  for muscle relaxation, but it was discontinued. She has not had good sleep since stopping clonazepam, which she was on for years.    She has run out of her medications, specifically amlodipine  and Paxil . She has been on Paxil  60 mg daily since her diagnosis of multiple sclerosis. She also takes amlodipine  5 mg and has olmesartan  for bp control.     She reports significant leg swelling, which she describes as 'fluid galore,' and has been elevating her feet. The swelling was present before starting amlodipine , which she has been on for less than a year. She has not used compression stockings but has a history of taking Lasix  for fluid management.    She has a family history of heart issues, as her mother has had two heart surgeries. She recalls being told she has a heart murmur since childhood. She has been on metformin  for weight loss without success and has tried weight loss gummies  from the internet.     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 No abdominal pain. Reports back pain, unsure if related to MS or kidney issues.  ______________________________________________________      Medications:  Reviewed in EPIC  __________________________________________________________    Physical Exam:   Vital Signs:  Vitals:    05/21/24 1306   BP: 132/89   BP Site: L Arm   BP Position: Sitting   BP Cuff Size: Large   Pulse: 58   Temp: 35.9 ??C (96.6 ??F)   TempSrc: Temporal   SpO2: 99%   Weight: (!) 136.1 kg (300 lb)   Height: 170.2 cm (5' 7)     Gen: Well appearing, NAD  CV: RRR, 1/6 systolic ejection murmur  Pulm: CTA bilaterally, no crackles or wheezes  Abd: Soft, NTND, normal BS.   Ext: 1+ pretibial edema b/l    Medication adherence and barriers to the treatment plan have been addressed. Opportunities to optimize healthy behaviors have been discussed. Patient / caregiver voiced understanding.

## 2024-05-21 NOTE — Unmapped (Signed)
 Per pripr plan, this medication should have been discotinued

## 2024-05-21 NOTE — Unmapped (Signed)
  Internal Medicine at [facility]     Reason for visit: Follow up    Questions / Concerns that need to be addressed: all over swelling- Paxil , Zanaflex , amlodipine , sleep issues    Screening BP- 132/89      HCDM reviewed and updated in Epic:    We are working to make sure all of our patients??? wishes are updated in Epic and part of that is documenting a Environmental health practitioner for each patient  A Health Care Decision Maker is someone you choose who can make health care decisions for you if you are not able - who would you most want to do this for you????  is already up to date.    HCDM (patient stated preference): Victoria Kelly,Victoria Kelly - Daughter - [phone]    __________________________________________________________________________________________    SCREENINGS COMPLETED IN FLOWSHEETS      AUDIT       PHQ2       PHQ9          [name]7       COPD Assessment       Falls Risk

## 2024-05-21 NOTE — Unmapped (Signed)
 Last Visit Date: 07/22/2023  Next Visit Date: Visit date not found  Last seen by Dr Dennise  Lab Results   Component Value Date    Hepatitis C Ab Nonreactive 10/21/2022    HIV Antigen/Antibody Combo Nonreactive 02/10/2012        Results for orders placed during the hospital encounter of 11/29/23    MRI Brain Wo Contrast    Narrative  EXAM: Magnetic resonance imaging, brain, without contrast material.  ACCESSION: [id number] UN      CLINICAL INDICATION: 53 years old Female with MS  - G35 - Multiple sclerosis (CMS - HCC)    COMPARISON: MRI brain with and without contrast 05/26/2021    TECHNIQUE: Multiplanar, multisequence MR imaging of the brain was performed without I.V. contrast.    FINDINGS:  There are multiple foci of T2/FLAIR hyperintensity in the infratentorial, juxtacortical and periventricular white matter which appear unchanged. Some of the lesions demonstrate corresponding T1 black holes consistent with axonal loss. No new lesions visualized.    The optic nerves are normal in appearance. No diffuse brain volume loss. Ventricles are normal in size.    There is no evidence of intracranial hemorrhage, acute infarct, or mass.    Impression  Multiple white matter lesions in a distribution consistent with multiple sclerosis, unchanged compared to prior MRI performed 05/26/2021.      Results for orders placed during the hospital encounter of 11/29/23    MRI Thoracic Spine Wo Contrast    Narrative  EXAM: Magnetic resonance imaging, spinal canal and contents, thoracic, without contrast material.  DATE: 11/29/2023 9:47 AM  ACCESSION: [id number] UN  DICTATED: 11/29/2023 9:53 AM  INTERPRETATION LOCATION: [facility]    CLINICAL INDICATION: 53 years old Female with MS  - G35 - Multiple sclerosis (CMS - HCC)    COMPARISON: MRI 05/26/2021 and x-ray 04/05/2023    TECHNIQUE: Multiplanar MRI was performed through the thoracic spine without contrast administration    FINDINGS:  Bone marrow signal intensity is normal. Moderate wedge deformity of L2. Alignment is normal. Multilevel disc space height loss, most notable at T11-T12 with associated Modic 2 endplate changes. No significant spinal canal or neural foraminal narrowing.    Slightly enlargement of T2-weighted hyperintense lesion seen in the right lateral cord at T3 (6:7).    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    The paraspinal tissues are within normal limits.    Impression  - The focus of T2 hyperintensity at the level of T3 is slightly larger when compared to prior.  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  - Stable wedge deformity at L1.      Results for orders placed during the hospital encounter of 11/29/23    MRI Cervical Spine Wo Contrast    Narrative  EXAM: Magnetic resonance imaging, spinal canal and contents, cervical without contrast material.  DATE: 11/29/2023 9:47 AM  ACCESSION: [id number] UN  DICTATED: 11/29/2023 10:59 AM  INTERPRETATION LOCATION: [facility]    CLINICAL INDICATION: 54 years old Female with MS  - G35 - Multiple sclerosis (CMS - HCC)    COMPARISON: Cervical MRI 05/26/2021    TECHNIQUE: Multiplanar multisequence MRI was performed through the cervical spine without intravenous contrast.    FINDINGS:  Bone marrow signal intensity is heterogeneous.    The vertebral bodies are normally aligned. Multilevel disc space height loss associated osteophytosis, notably at C4-C5 and C5-C6.    Disc bulge at C4-C5 results in mild spinal canal narrowing, progressed from prior. Advanced left-sided joint arthropathy contributes to  mild left-sided neural foraminal narrowing at this level.    Disc bulge with left subarticular protrusion at C5-C6 without significant spinal canal narrowing. Moderate facet arthropathy results in mild bilateral neural foraminal stenosis.    Similar size of right lateral T2 hyperintensity at the level of C2 and C3 (5:8).    The paraspinal tissues are within normal limits.    Impression  - Stable T2 hyperintense lesions in the right lateral cord at C2 and C3.  - Progressed multilevel degenerative spondylosis with mild spinal canal narrowing at C4-C5.

## 2024-05-21 NOTE — Unmapped (Addendum)
 It was a pleasure seeing you in clinic today. We have made the following plan.     I have refilled your paroxitine and amlodipine  today  Please call: Neurology- [phone] to schedule a follow up apt to discuss MS follow up and tension headaches   Please start wearing compression stockings    I have ordered your mammogram, they will call you to schedule. If you dont hear from them Please call: Radiology- [phone] to schedule.   Please monitor your blood pressure at home at least several times a week. Please call or send me a message if your BP is running > 130/80 consistently. Please send me measurements in 1-2 weeks. Please bring 1-2 weeks of measurements in to your next visit.  Follow up: Return in about 3 months (around 08/21/2024).     Otherwise, we look forward to seeing you in clinic at your next appointment!

## 2024-05-28 NOTE — Unmapped (Signed)
 I saw and evaluated the patient, participating in the key portions of the service.  I reviewed the resident???s note.  I agree with the resident???s findings and plan. Stanford Scotland, MD

## 2024-06-04 ENCOUNTER — Ambulatory Visit: Admit: 2024-06-04 | Discharge: 2024-06-05 | Payer: Medicare (Managed Care)

## 2024-06-04 ENCOUNTER — Encounter: Admit: 2024-06-04 | Discharge: 2024-06-05 | Payer: Medicare (Managed Care)

## 2024-06-04 DIAGNOSIS — M810 Age-related osteoporosis without current pathological fracture: Principal | ICD-10-CM

## 2024-06-04 DIAGNOSIS — E279 Disorder of adrenal gland, unspecified: Principal | ICD-10-CM

## 2024-06-04 DIAGNOSIS — N2581 Secondary hyperparathyroidism of renal origin: Principal | ICD-10-CM

## 2024-06-04 LAB — BASIC METABOLIC PANEL
ANION GAP: 7 mmol/L (ref 5–14)
BLOOD UREA NITROGEN: 11 mg/dL (ref 9–23)
BUN / CREAT RATIO: 10
CALCIUM: 9.2 mg/dL (ref 8.7–10.4)
CHLORIDE: 106 mmol/L (ref 98–107)
CO2: 28.1 mmol/L (ref 20.0–31.0)
CREATININE: 1.08 mg/dL — ABNORMAL HIGH (ref 0.55–1.02)
EGFR CKD-EPI (2021) FEMALE: 61 mL/min/1.73m2 (ref >=60)
GLUCOSE RANDOM: 89 mg/dL (ref 70–179)
POTASSIUM: 4 mmol/L (ref 3.4–4.8)
SODIUM: 141 mmol/L (ref 135–145)

## 2024-06-04 LAB — PARATHYROID HORMONE (PTH): PARATHYROID HORMONE INTACT: 159.1 pg/mL — ABNORMAL HIGH (ref 18.4–80.1)

## 2024-06-04 MED ORDER — DEXAMETHASONE 1 MG TABLET
ORAL_TABLET | Freq: Once | ORAL | 0 refills | 1.00000 days | Status: CP
Start: 2024-06-04 — End: 2024-06-04

## 2024-06-04 NOTE — Unmapped (Signed)
 [facility]: New Patient      Chief Complaint: Vertebral Compression Fracture     History of Present Illness  Victoria Kelly is a 53 y.o.  woman with a history of multiple sclerosis, obesity s/p sleeve gastrectomy, hypertension and anxiety disorder who is seen in consultation today at the request of [name] for recommendations regarding vertebral compression fracture.    She experienced a fall in June of last year, resulting in a lumbar fracture. The fall occurred in the shower, landing on the rim of the tub, causing significant pain. She endured the pain for two weeks before seeking medical attention. An MRI confirmed a [name] with about 30% height loss.Following that event, an e-consult was placed to endocrinology and this was performed 06/2023. The recommendation was additional lab workup including measurement of pth and treatment with a bisphosphonate, with specific recommendation for risedronate  x 5 years. She started on this around 12/2023 and is taking it daily (prescribed 5 mg dose). She is taking this with other medications.    Subsequently, PTH was evaluated and was elevated at 176. 25-OH D was found to be low at 13. She was then given 50,000 units weekly x 3 months but did completed this and did not start anything new afterwards.     She underwent sleeve gastrectomy in 2015 but reports only losing 30 pounds despite following dietary and exercise recommendations. She is dissatisfied with the weight loss outcome and is considering revision surgery. She also previously followed in weight management clinic but hasn't been seen since 2022. She has a referral pending but they were not able to reach her.       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 Works as [employer]. Never smoker.        Current Medications[2]       Allergies[3]     Review of Systems   Pertinent positive and negative systems are described in the HPI; the remainder of the 14 systems are negative.      BP 144/86 (BP Site: L Arm, BP Position: Sitting)  - Pulse 62  - Ht 170.2 cm (5' 7.01)  - Wt (!) 135.5 kg (298 lb 12.8 oz)  - BMI 46.79 kg/m??    Physical Exam  Constitutional:       General: She is not in acute distress.     Appearance: Normal appearance.   HENT:      Head: Normocephalic and atraumatic.   Eyes:      Pupils: Pupils are equal, round, and reactive to light.   Neck:      Comments: Thyroid  is normal to palpation. No nodules. Some supraclavicular fullness.   Cardiovascular:      Rate and Rhythm: Normal rate and regular rhythm.      Heart sounds: No murmur heard.  Neurological:      Mental Status: She is alert and oriented to person, place, and time.      Deep Tendon Reflexes: Reflexes normal.           Laboratory Review: I personally reviewed labs 06/04/24.    12/2023  25-OH D 13  PTH 176  Renin 1.3  Aldosterone < 4  Plasma normetanephrine 1.0  Plasma metanephrine < 0.2    01/2024  Na 146  K 4.5  Cr 1.04  eGFR 64  Ca 9.1  Alk Phos 70      Radiology Review: I personally reviewed pertinent radiology 06/04/24.    DXA Review:  I personally reviewed and interpreted study. T-scores are used.     Date Lumbar Spine Femoral Neck Total Hip   03/2023 -1.1 -0.8 1.1 03/2023 XR Lumbar Spine: I personally reviewed 06/04/24.   - L1 anterior wedge compression fracture        MRI Lumbar Spine  01/2024  - chronic wedge deformity at L1  - incidental 2.4 cm left adrenal nodule, reportedly visualized as far back as 2005 on imaging      Other medical data:   Reviewed and summarized (above) records in preparation for today's visit all pertinent notes in Epic/Media and CareEverywhere as well as any sent records.     Impression and Plan:     Victoria Kelly is a 53 y.o. woman with a history of hypertension, obesity s/p gastric sleeve, MS who is seen in consultation today for recommendations regarding lumbar compression fracture.    1. [name], Query Osteoporosis: Ms. Schwartzman was found to have [name] after a fall in the shower last year. Her sacrum landed directly on the rim of the tub and she reports feeling a reverberation up her spine with pain. In some ways, I think this is challenging case. The compression fracture with a fall from ground level is essentially classic for a fragility fracture, but I do think the mechanism is such that we might have seen a fracture even in a patient without osteoporosis. Her bone density study shows low bone density at the lumbar spine. She is quite young, but does have some risk factors for osteoporosis including bariatric surgery, multiple sclerosis, vitamin D  deficiency and probably inadequate calcium intake. Her bariatric surgery is relevant because although she has not lost that much weight, this has probably contributed in part of vitamin D  deficiency. She did have an early hysterectomy, but as best I can tell this did not involve oophorectomy. Additionally, she consumes little calcium and does not take calcium supplements, which would typically be recommended after bariatric surgery. I think that we should optimize vitamin D  and calcium balance to prevent ongoing bone loss over time. I think it is reasonable to consider treatment with antiresorptive agent. Given her history of gastric surgery, one might favor IV bisphosphonate though there is not an absolute contraindication to oral therapy. I think that if she continues with oral therapy, it would make sense to consolidate her risedronate  into weekly dose. This is typically given as 35 mg weekly (or sometimes 150 mg monthly). This needs to be taken apart from other meds on empty stomach and she needs to remain upright. If she preferred, she could also do  zoledronic acid annually x 3 yrs. I will discuss with her PCP. With regards to vitamin d  def, she needs maintenance therapy, probably on the order of at least 2000-5000 units daily but we do not know. We will see where her levels are after 3 mo of 50,000 units. We also discussed calcium citrate 600 mg bid.   - Ok to continue risedronate , would consolidate to 35 mg weekly taken on empty stomach apart from other meds and food  - Alternatively, can do zoledronic acid 5 mg IV annually x 3 yrs to avoid absorptive issues  - Update 25-OH D level, anticipate starting D3 daily  - Start calcium citrate 600 mg bid    2. Secondary Hyperparathyroidism: probably due to combination of vitamin d  deficiency and calcium deficiency. Will address as above with improving both of these issues.  - 25-OH D level today  - Anticipate starting D3, dose based on value  - Start calcium citrate 600 mg bid    3. Adrenal nodule: She has a 2.4 cm adrenal nodule reported on various MRIs of the spine. This has not had dedicated imaging but is almost certainly benign given report of it being present in 2005. I think dedicated imaging is appropriate x 1. MRI may be preferred given her mild degree of [name]. She has had some hormonal workup recently which includes negative screen for PA. Plasma normet is very mildly elevated, which is generally a false positive and does not correlate with what value that would be expected in a 2.5 cm PCC. I'm not sure any of her meds are classic cause of mild elevation in normet. If imagnig is clearly c/w lipid rich adenoma, repeat screening is not required. If not, we can repeat or do 24 hr urine mets. She does need overnight DST.  - MRI Abdomen for full evaluation of adrenal nodule  - Overnight DST      Patient provided her educational handouts re: osteoporosis and calcium/vitamin D       The patient and I dicussed the potential risks and benefits of therapy, and I reviewed proper administration. I recommend dental evaluation and completion of any invasive dental procedures prior to initiation of therapy. The patient was given a handout on our current understanding of rare side effects of therapy including osteonecrosis of the jaw and atypical fractures.      Return in about 3 months (around 09/04/2024).      Fonda CHRISTELLA Provencal, MD         [1]   Family History  Problem Relation Age of Onset    Lupus Mother     Hypertension Mother     Fibromyalgia Mother     Heart disease Mother         aortic valve replacement    Cataracts Mother     Diabetes Mother     Brain cancer Father         Brain tumor    Hyperlipidemia Father     Leukemia Maternal Aunt     Lupus Maternal Aunt     Lupus Maternal Aunt     Breast cancer Cousin     Cervical cancer Cousin    [2]   Current Outpatient Medications   Medication Sig Dispense Refill    acetaminophen  (TYLENOL ) 500 MG tablet Take 2 tablets (1,000 mg total) by mouth every eight (8) hours as needed for pain. 30 tablet 0    amlodipine  (NORVASC ) 5 MG tablet Take 1 tablet (5 mg total) by  mouth daily. 90 tablet 3    ascorbic acid, vitamin C , (ASCORBIC ACID) 250 mg Chew Chew.      ferrous sulfate  325 (65 FE) MG tablet Take 1 tablet (325 mg total) by mouth in the morning. 90 tablet 3    fluticasone  propionate (FLONASE ) 50 mcg/actuation nasal spray 2 sprays into each nostril daily. 16 g 6    modafinil  (PROVIGIL ) 100 MG tablet TAKE 2 TABLETS BY MOUTH EVERY DAY 120 tablet 2    olmesartan  (BENICAR ) 40 MG tablet Take 1 tablet (40 mg total) by mouth daily. 90 tablet 1    oxybutynin  (DITROPAN -XL) 5 MG 24 hr tablet Take 1 tablet (5 mg total) by mouth daily. 30 tablet 5    pantoprazole  (PROTONIX ) 40 MG tablet Take 1 tablet (40 mg total) by mouth in the morning. 90 tablet 3    PARoxetine  (PAXIL ) 30 MG tablet Take 2 tablets (60 mg total) by mouth every evening. 180 tablet 2    pregabalin  (LYRICA ) 75 MG capsule Take 1 capsule (75 mg total) by mouth nightly. 360 capsule 1    risedronate  (ACTONEL ) 5 MG tablet Take 1 tablet (5 mg total) by mouth daily before breakfast. with water on empty stomach, nothing by mouth or lie down for next 30 minutes. 90 tablet 3    teriflunomide  (AUBAGIO ) 14 mg Tab Take 1 tablet (14 mg total) by mouth every evening. 90 tablet 1    tizanidine  (ZANAFLEX ) 4 MG tablet Take 2 mg at bedtime (1/2 pill) for one week and then stop       No current facility-administered medications for this visit.   [3]   Allergies  Allergen Reactions    Cymbalta [Duloxetine] Other (See Comments)     Metal taste in mouth      Keflex [Cephalexin] Other (See Comments)     rash    Kelp Itching

## 2024-06-04 NOTE — Unmapped (Signed)
 Bones:   1. Start calcium CITRATE 600 mg (2 pills) twice daily  2. I will update vitamin D  level today to help gauge starting dose of D3  3. I will talk with PCP about switching your risedronate  to either weekly or switching to IV annually    Adrenal Nodule:   1. Dedicated MRI   2. Overnight dexamethasone  suppression test  - Take 2 mg dexamethasone  at bedtime  - Go to [facility] next AM for lab draw (Fasting)

## 2024-06-05 LAB — FOLLICLE STIMULATING HORMONE: FOLLICLE STIMULATING HORMONE: 49.3 m[IU]/mL

## 2024-06-06 LAB — VITAMIN D 25 HYDROXY: VITAMIN D, TOTAL (25OH): 27.1 ng/mL (ref 20.0–80.0)

## 2024-06-07 ENCOUNTER — Encounter: Admit: 2024-06-07 | Discharge: 2024-06-08 | Payer: Medicare (Managed Care)

## 2024-06-07 ENCOUNTER — Ambulatory Visit: Admit: 2024-06-07 | Discharge: 2024-06-08 | Payer: Medicare (Managed Care)

## 2024-06-07 LAB — CORTISOL: CORTISOL TOTAL: 1.6 ug/dL (ref >=1.5)

## 2024-06-08 DIAGNOSIS — I10 Essential (primary) hypertension: Principal | ICD-10-CM

## 2024-06-08 MED ORDER — OLMESARTAN 40 MG TABLET
ORAL_TABLET | Freq: Every day | ORAL | 3 refills | 90.00000 days | Status: CP
Start: 2024-06-08 — End: 2025-05-21

## 2024-06-08 NOTE — Unmapped (Signed)
 Pharmacy Requesting refill(s)

## 2024-06-11 LAB — ACTH: ADRENOCORTICOTROPIC HORMONE: 5.2 pg/mL — ABNORMAL LOW

## 2024-06-12 NOTE — Unmapped (Signed)
 [facility] has made a second and final attempt to reach this patient to refill the following medication:teriflunomide  14 mg Tab (AUBAGIO ).      We have been unable to leave messages on the following phone numbers: [phone]   and [phone]  and have sent a text message to the following phone numbers: [phone].    Dates contacted: 06/04/24  and   06/12/24   Last scheduled delivery: 05/15/24     The patient may be at risk of non-compliance with this medication. The patient should call the [facility] at [phone]  Option 4, then Option 3: Allergy, Immunology, Pulmonary, Neurology to refill medication.    Tawni Daring   [facility]

## 2024-06-14 ENCOUNTER — Ambulatory Visit: Admit: 2024-06-14 | Discharge: 2024-06-15 | Payer: Medicare (Managed Care) | Attending: Nephrology | Primary: Nephrology

## 2024-06-14 DIAGNOSIS — N1831 Stage 3a chronic kidney disease (CMS-HCC): Principal | ICD-10-CM

## 2024-06-14 LAB — DEXAMETHASONE, SERUM: DEXAMETHASONE, SERUM: 475 ng/dL

## 2024-06-14 NOTE — Unmapped (Addendum)
 Medicines to Avoid With Kidney Disease: Care Instructions  Overview     Kidney disease means that your kidneys are not able to get rid of waste from the blood. So they can't keep your body's fluids and chemicals in balance. Usually, the kidneys get rid of waste from the blood through the urine. And they balance the fluids in the body.  When your kidneys don't work as they should, you have to be careful about some medicines. They may harm your kidneys. Your doctor may tell you not to take them or may change the dose.  Medicines for pain and swelling, such as ibuprofen (Advil or Motrin) or naproxen  (Aleve ), can cause harm. So can some antibiotics and antacids. And you need to be careful about some drugs that treat cancer, lower blood pressure, or get rid of water from the body. Some herbal products could cause harm too.  Follow-up care is a key part of your treatment and safety. Be sure to make and go to all appointments, and call your doctor if you are having problems. It's also a good idea to know your test results and keep a list of the medicines you take.  How can you care for yourself at home?  Tell your doctor all the prescription, herbal, or over-the-counter medicines you take. Do not take any new ones unless you talk to your doctor first.  Do not take anti-inflammatory medicines. These include ibuprofen (Advil, Motrin) and naproxen  (Aleve ). You can use acetaminophen  (Tylenol ) for pain.  Do not take two or more pain medicines at the same time unless the doctor told you to. Many pain medicines have acetaminophen , which is Tylenol . Too much acetaminophen  (Tylenol ) can be harmful.  Tell all doctors and others who work with your health care that you have kidney disease.  Wear medical alert jewelry that lists your health problem. You can buy this at most drugstores.  Where can you learn more?  Go to [facility] at https://my[facility]chart.[employer]ed forces logistics/support/administrative officer in the Menu. Enter [phone] in the search box to learn more about Medicines to Avoid With Kidney Disease: Care Instructions.  Current as of: June 25, 2020               Content Version: 43.1          Dear Victoria Kelly    It was good to see you! As we discussed, your kidney function is doing well! It has been quite stable with a GFR about 61 ml/min.      Keep doing what you are doing with your medications. Blood pressure looks good in clinic, and continue to stay away from over the counter medications. Remember that tylenol  is the only kidney safe medication.    I will message your primary care doctor about medications like ozempic for weight loss.    See you back in about 9 months.    Sincerely,     DrK

## 2024-06-14 NOTE — Unmapped (Signed)
 PCP:  Victoria Rollo FALCON, MD      06/14/2024      ASSESSMENT/PLAN:      [name] is a 53 y.o. year old patient with a past medical history significant for chronic kidney disease.  She is being seen for follow up visit for ongoing evaluation care of chronic kidney disease.    1.  Chronic kidney disease  Recent labs: 06/04/2024 serum creatinine 1.08, EGFR 61, PTH 159    Patient has chronic kidney disease, stage II, based on recent labs. She has had minimal albuminuria. Her range of GFR has been from 48-60 since January 2024. The acute decreases were of unclear cause. Urine microscopy has revealed an acute process, but did show a scattered white cells without bacteria at the last visit. Given that 2 family members developed end-stage kidney disease and she previously had white cells in her urine, she may have an inherited form of kidney disease, such as autosomal dominant tubulointerstitial disease.  It may have been related to vasomotor effects of medications and/or subclinical volume depletion     -Continue to monitor    2.  Hypertension  Blood pressure today is at goal, less than 130/80 mmHg.  Previous values have been slightly higher.  I would continue the current medications for now, amlodipine  and olmesartan .  BP Readings from Last 3 Encounters:   06/04/24 144/86   05/21/24 132/89   02/09/24 130/78     -Continue to monitor    3.  Obesity  The patient status post gastric sleeve.  She continues to have a very high BMI.  She states that her surgeon has suggested gastric bypass.  I have asked her to discuss GLP 1 receptor agonist such as Ozempic or Wegovy  with her team.  I will also query her primary provider.    Wt Readings from Last 3 Encounters:   06/04/24 (!) 135.5 kg (298 lb 12.8 oz)   05/21/24 (!) 136.1 kg (300 lb)   02/09/24 (!) 131.5 kg (290 lb)       4.  Hyperparathyroidism  Recent labs: PTH 159, corrected calcium 9. 5    While the low vitamin D  25 suggests secondary hyperparathyroidism in the setting of mild chronic kidney disease, a 1, 25 vitamin D  would be useful to document low active vitamin D  as the driver of the hyperparathyroidism.    5.  Multiple sclerosis  The patient reports being in a relapse, and is mainly receiving aubagio .          [name] will follow up in 6 to 9 months.     I personally spent 41 minutes face-to-face and non-face-to-face in the care of this patient, which includes all pre, intra, and post visit time on the date of service.      Chief Complaint: Follow up visit [name]     Background:     HPI:  [name] presents for follow-up to the [facility] nephrology clinic for ongoing care of chronic kidney disease.  I last saw her about 1 year ago.      In the interval, she has had ongoing evaluation with endocrinology for a compression fracture as well as an adenoma.  She is also being treated for a multiple sclerosis flare.  She is no longer on an ACE inhibitor, but now on olmesartan .     She reports stable urine output without discolored urine or gross hematuria.  She denies any change in exercise tolerance, overall  low.  She is concerned about her lack of ability to lose weight.  She denies any dyspnea currently and denies any chest pain or chest tightness.  She denies any lower extremity edema.  She denies nausea, vomiting, abdominal pain or diarrhea.    Otherwise as per ROS      ROS:   CONSTITUTIONAL: denies fevers or chills, denies unintentional weight loss  CARDIOVASCULAR: denies chest pain, denies dyspnea on exertion, denies leg edema  GASTROINTESTINAL: denies nausea, denies vomiting, denies anorexia  GENITOURINARY: denies dysuria, denies hematuria, denies decreased urinary stream  All systems reviewed and are negative except as listed above.    PAST MEDICAL HISTORY:  Past Medical History[1]    ALLERGIES  Cymbalta [duloxetine], Keflex [cephalexin], and Kelp    MEDICATIONS:  Current Medications[2]    PHYSICAL EXAM:  Vitals:    06/14/24 1041   BP: 126/80   Pulse: 64   Temp: 35.9 ??C (96.7 ??F)       BP Readings from Last 3 Encounters:   06/04/24 144/86   05/21/24 132/89   02/09/24 130/78     Wt Readings from Last 6 Encounters:   06/04/24 (!) 135.5 kg (298 lb 12.8 oz)   05/21/24 (!) 136.1 kg (300 lb)   02/09/24 (!) 131.5 kg (290 lb)   01/30/24 (!) 135.2 kg (298 lb)   01/09/24 (!) 134.7 kg (297 lb)   11/28/23 (!) 137 kg (302 lb)         CONSTITUTIONAL: Alert,well appearing, no distress  HEENT: Moist mucous membranes, oropharynx clear without erythema or exudate  EYES: Extra ocular movements intact. Pupils reactive, sclerae anicteric.  NECK: Supple, no lymphadenopathy  CARDIOVASCULAR: Regular, normal S1/S2 heart sounds, no murmurs, no rubs.   PULM: Clear to auscultation bilaterally  GASTROINTESTINAL: Soft, active bowel sounds, nontender  EXTREMITIES: No lower extremity edema bilaterally.   SKIN: No rashes or lesions  NEUROLOGIC: No focal motor or sensory deficits      MEDICAL DECISION MAKING    Component      Latest Ref Rng 01/30/2024 06/04/2024   Sodium      135 - 145 mmol/L 146 (H)  141    Potassium      3.4 - 4.8 mmol/L 4.5  4.0    Chloride      98 - 107 mmol/L 107  106    Anion Gap      5 - 14 mmol/L 10  7    CO2      20.0 - 31.0 mmol/L 28.7  28.1    Bun      9 - 23 mg/dL 13  11    Creatinine      0.55 - 1.02 mg/dL 8.95 (H)  8.91 (H)    BUN/Creatinine Ratio 13  10    Glucose      70 - 179 mg/dL 94  89    Calcium      8.7 - 10.4 mg/dL 9.1  9.2    eGFR [name]-EPI (2021) Female      >=60 mL/min/1.59m2 64  61       Legend:  (H) High    Lab Results   Component Value Date    PTH 159.1 (H) 06/04/2024    CALCIUM 9.2 06/04/2024                                  [1]   Past Medical History:  Diagnosis Date    Acid reflux     Allergic rhinitis, cause unspecified 04/11/2013    Depression     Dysthymic disorder 02/10/2012    Generalized anxiety disorder 02/10/2012    H/O bariatric surgery     High blood cholesterol level 05/31/2013    Hx of trichomonal vaginitis 02/2006    On Pap smear    Hypertension     Insomnia 05/23/2013    Getting ambien through psych      Iron  deficiency anemia 05/24/2013    MS (multiple sclerosis)     11/23/2001    last flare up was 12/2019    Obesity, Class III, BMI 40-49.9 (morbid obesity)     Obstructive sleep apnea 11/01/2011    uses cpap    [2]   Current Outpatient Medications   Medication Sig Dispense Refill    acetaminophen  (TYLENOL ) 500 MG tablet Take 2 tablets (1,000 mg total) by mouth every eight (8) hours as needed for pain. 30 tablet 0    amlodipine  (NORVASC ) 5 MG tablet Take 1 tablet (5 mg total) by mouth daily. 90 tablet 3    ascorbic acid, vitamin C , (ASCORBIC ACID) 250 mg Chew Chew.      ferrous sulfate  325 (65 FE) MG tablet Take 1 tablet (325 mg total) by mouth in the morning. 90 tablet 3    fluticasone  propionate (FLONASE ) 50 mcg/actuation nasal spray 2 sprays into each nostril daily. 16 g 6    modafinil  (PROVIGIL ) 100 MG tablet TAKE 2 TABLETS BY MOUTH EVERY DAY 120 tablet 2    olmesartan  (BENICAR ) 40 MG tablet Take 1 tablet (40 mg total) by mouth daily. 90 tablet 3    oxybutynin  (DITROPAN -XL) 5 MG 24 hr tablet Take 1 tablet (5 mg total) by mouth daily. 30 tablet 5    pantoprazole  (PROTONIX ) 40 MG tablet Take 1 tablet (40 mg total) by mouth in the morning. 90 tablet 3    PARoxetine  (PAXIL ) 30 MG tablet Take 2 tablets (60 mg total) by mouth every evening. 180 tablet 2    pregabalin  (LYRICA ) 75 MG capsule Take 1 capsule (75 mg total) by mouth nightly. 360 capsule 1    risedronate  (ACTONEL ) 5 MG tablet Take 1 tablet (5 mg total) by mouth daily before breakfast. with water on empty stomach, nothing by mouth or lie down for next 30 minutes. 90 tablet 3    teriflunomide  (AUBAGIO ) 14 mg Tab Take 1 tablet (14 mg total) by mouth every evening. 90 tablet 1    tizanidine  (ZANAFLEX ) 4 MG tablet Take 2 mg at bedtime (1/2 pill) for one week and then stop       No current facility-administered medications for this visit.

## 2024-07-04 ENCOUNTER — Emergency Department
Admit: 2024-07-04 | Discharge: 2024-07-04 | Disposition: A | Discharge status: Home / Self Care | Payer: Medicare (Managed Care)

## 2024-07-04 DIAGNOSIS — S39012A Strain of muscle, fascia and tendon of lower back, initial encounter: Principal | ICD-10-CM

## 2024-07-04 MED ORDER — METHOCARBAMOL 500 MG TABLET
ORAL_TABLET | Freq: Two times a day (BID) | ORAL | 0 refills | 5.00000 days | Status: CP
Start: 2024-07-04 — End: 2024-07-09

## 2024-07-04 MED ADMIN — ketorolac (TORADOL) injection 30 mg: 30 mg | INTRAMUSCULAR | @ 19:00:00 | Stop: 2024-07-04

## 2024-07-04 MED ADMIN — acetaminophen (TYLENOL) tablet 1,000 mg: 1000 mg | ORAL | @ 19:00:00 | Stop: 2024-07-04

## 2024-07-04 MED ADMIN — lidocaine (ASPERCREME) 4 % 1 patch: 1 | TRANSDERMAL | @ 19:00:00 | Stop: 2024-07-04
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
## 2024-07-18 ENCOUNTER — Ambulatory Visit: Admit: 2024-07-18 | Payer: Medicare (Managed Care)

## 2024-07-20 MED ORDER — TIZANIDINE 4 MG TABLET
ORAL_TABLET | Freq: Every evening | ORAL | 11 refills | 0.00000 days
Start: 2024-07-20 — End: ?

## 2024-07-23 DIAGNOSIS — G35D Multiple sclerosis: Principal | ICD-10-CM

## 2024-07-23 DIAGNOSIS — N3281 Overactive bladder: Principal | ICD-10-CM

## 2024-07-23 DIAGNOSIS — I151 Hypertension secondary to other renal disorders: Principal | ICD-10-CM

## 2024-07-23 MED ORDER — AMLODIPINE 5 MG TABLET
ORAL_TABLET | 11 refills | 0.00000 days
Start: 2024-07-23 — End: ?

## 2024-07-23 MED ORDER — OXYBUTYNIN CHLORIDE ER 5 MG TABLET,EXTENDED RELEASE 24 HR
ORAL_TABLET | Freq: Every day | ORAL | 11 refills | 0.00000 days
Start: 2024-07-23 — End: ?

## 2024-07-24 MED ORDER — TIZANIDINE 4 MG TABLET
ORAL_TABLET | Freq: Every evening | ORAL | 11 refills | 30.00000 days
Start: 2024-07-24 — End: ?

## 2024-07-24 MED ORDER — OXYBUTYNIN CHLORIDE ER 5 MG TABLET,EXTENDED RELEASE 24 HR
ORAL_TABLET | Freq: Every day | ORAL | 1 refills | 30.00000 days | Status: CP
Start: 2024-07-24 — End: ?

## 2024-07-24 NOTE — Unmapped (Signed)
 Per documentation, Dr. Dujmovic tapered & discontinued tizanidine  at last office visit in April 2025.

## 2024-07-24 NOTE — Unmapped (Signed)
 Last Visit Date: 07/22/2023  Next Visit Date: Visit date not found    Lab Results   Component Value Date    Hepatitis C Ab Nonreactive 10/21/2022    HIV Antigen/Antibody Combo Nonreactive 02/10/2012        Results for orders placed during the hospital encounter of 11/29/23    MRI Brain Wo Contrast    Narrative  EXAM: Magnetic resonance imaging, brain, without contrast material.  ACCESSION: [id number] UN      CLINICAL INDICATION: 53 years old Female with MS  - G35 - Multiple sclerosis (CMS - HCC)    COMPARISON: MRI brain with and without contrast 05/26/2021    TECHNIQUE: Multiplanar, multisequence MR imaging of the brain was performed without I.V. contrast.    FINDINGS:  There are multiple foci of T2/FLAIR hyperintensity in the infratentorial, juxtacortical and periventricular white matter which appear unchanged. Some of the lesions demonstrate corresponding T1 black holes consistent with axonal loss. No new lesions visualized.    The optic nerves are normal in appearance. No diffuse brain volume loss. Ventricles are normal in size.    There is no evidence of intracranial hemorrhage, acute infarct, or mass.    Impression  Multiple white matter lesions in a distribution consistent with multiple sclerosis, unchanged compared to prior MRI performed 05/26/2021.      Results for orders placed during the hospital encounter of 11/29/23    MRI Thoracic Spine Wo Contrast    Narrative  EXAM: Magnetic resonance imaging, spinal canal and contents, thoracic, without contrast material.  DATE: 11/29/2023 9:47 AM  ACCESSION: [id number] UN  DICTATED: 11/29/2023 9:53 AM  INTERPRETATION LOCATION: [facility]    CLINICAL INDICATION: 53 years old Female with MS  - G35 - Multiple sclerosis (CMS - HCC)    COMPARISON: MRI 05/26/2021 and x-ray 04/05/2023    TECHNIQUE: Multiplanar MRI was performed through the thoracic spine without contrast administration    FINDINGS:  Bone marrow signal intensity is normal. Moderate wedge deformity of L2. Alignment is normal. Multilevel disc space height loss, most notable at T11-T12 with associated Modic 2 endplate changes. No significant spinal canal or neural foraminal narrowing.    Slightly enlargement of T2-weighted hyperintense lesion seen in the right lateral cord at T3 (6:7).    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    The paraspinal tissues are within normal limits.    Impression  - The focus of T2 hyperintensity at the level of T3 is slightly larger when compared to prior.  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  - Stable wedge deformity at L1.      Results for orders placed during the hospital encounter of 11/29/23    MRI Cervical Spine Wo Contrast    Narrative  EXAM: Magnetic resonance imaging, spinal canal and contents, cervical without contrast material.  DATE: 11/29/2023 9:47 AM  ACCESSION: [id number] UN  DICTATED: 11/29/2023 10:59 AM  INTERPRETATION LOCATION: [facility]    CLINICAL INDICATION: 53 years old Female with MS  - G35 - Multiple sclerosis (CMS - HCC)    COMPARISON: Cervical MRI 05/26/2021    TECHNIQUE: Multiplanar multisequence MRI was performed through the cervical spine without intravenous contrast.    FINDINGS:  Bone marrow signal intensity is heterogeneous.    The vertebral bodies are normally aligned. Multilevel disc space height loss associated osteophytosis, notably at C4-C5 and C5-C6.    Disc bulge at C4-C5 results in mild spinal canal narrowing, progressed from prior. Advanced left-sided joint arthropathy contributes to mild left-sided neural foraminal  narrowing at this level.    Disc bulge with left subarticular protrusion at C5-C6 without significant spinal canal narrowing. Moderate facet arthropathy results in mild bilateral neural foraminal stenosis.    Similar size of right lateral T2 hyperintensity at the level of C2 and C3 (5:8).    The paraspinal tissues are within normal limits.    Impression  - Stable T2 hyperintense lesions in the right lateral cord at C2 and C3.  - Progressed multilevel degenerative spondylosis with mild spinal canal narrowing at C4-C5.      No results found. However, due to the size of the patient record, not all encounters were searched. Please check Results Review for a complete set of results.

## 2024-07-24 NOTE — Unmapped (Signed)
 Last Visit Date: 07/22/2023  Next Visit Date:     Lab Results   Component Value Date    Hepatitis C Ab Nonreactive 10/21/2022    HIV Antigen/Antibody Combo Nonreactive 02/10/2012        Results for orders placed during the hospital encounter of 11/29/23    MRI Brain Wo Contrast    Narrative  EXAM: Magnetic resonance imaging, brain, without contrast material.  ACCESSION: [id number] UN      CLINICAL INDICATION: 53 years old Female with MS  - G35 - Multiple sclerosis (CMS - HCC)    COMPARISON: MRI brain with and without contrast 05/26/2021    TECHNIQUE: Multiplanar, multisequence MR imaging of the brain was performed without I.V. contrast.    FINDINGS:  There are multiple foci of T2/FLAIR hyperintensity in the infratentorial, juxtacortical and periventricular white matter which appear unchanged. Some of the lesions demonstrate corresponding T1 black holes consistent with axonal loss. No new lesions visualized.    The optic nerves are normal in appearance. No diffuse brain volume loss. Ventricles are normal in size.    There is no evidence of intracranial hemorrhage, acute infarct, or mass.    Impression  Multiple white matter lesions in a distribution consistent with multiple sclerosis, unchanged compared to prior MRI performed 05/26/2021.      Results for orders placed during the hospital encounter of 11/29/23    MRI Thoracic Spine Wo Contrast    Narrative  EXAM: Magnetic resonance imaging, spinal canal and contents, thoracic, without contrast material.  DATE: 11/29/2023 9:47 AM  ACCESSION: [id number] UN  DICTATED: 11/29/2023 9:53 AM  INTERPRETATION LOCATION: [facility]    CLINICAL INDICATION: 53 years old Female with MS  - G35 - Multiple sclerosis (CMS - HCC)    COMPARISON: MRI 05/26/2021 and x-ray 04/05/2023    TECHNIQUE: Multiplanar MRI was performed through the thoracic spine without contrast administration    FINDINGS:  Bone marrow signal intensity is normal. Moderate wedge deformity of L2. Alignment is normal. Multilevel disc space height loss, most notable at T11-T12 with associated Modic 2 endplate changes. No significant spinal canal or neural foraminal narrowing.    Slightly enlargement of T2-weighted hyperintense lesion seen in the right lateral cord at T3 (6:7).    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    The paraspinal tissues are within normal limits.    Impression  - The focus of T2 hyperintensity at the level of T3 is slightly larger when compared to prior.  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  - Stable wedge deformity at L1.      Results for orders placed during the hospital encounter of 11/29/23    MRI Cervical Spine Wo Contrast    Narrative  EXAM: Magnetic resonance imaging, spinal canal and contents, cervical without contrast material.  DATE: 11/29/2023 9:47 AM  ACCESSION: [id number] UN  DICTATED: 11/29/2023 10:59 AM  INTERPRETATION LOCATION: [facility]    CLINICAL INDICATION: 53 years old Female with MS  - G35 - Multiple sclerosis (CMS - HCC)    COMPARISON: Cervical MRI 05/26/2021    TECHNIQUE: Multiplanar multisequence MRI was performed through the cervical spine without intravenous contrast.    FINDINGS:  Bone marrow signal intensity is heterogeneous.    The vertebral bodies are normally aligned. Multilevel disc space height loss associated osteophytosis, notably at C4-C5 and C5-C6.    Disc bulge at C4-C5 results in mild spinal canal narrowing, progressed from prior. Advanced left-sided joint arthropathy contributes to mild left-sided neural foraminal narrowing at this  level.    Disc bulge with left subarticular protrusion at C5-C6 without significant spinal canal narrowing. Moderate facet arthropathy results in mild bilateral neural foraminal stenosis.    Similar size of right lateral T2 hyperintensity at the level of C2 and C3 (5:8).    The paraspinal tissues are within normal limits.    Impression  - Stable T2 hyperintense lesions in the right lateral cord at C2 and C3.  - Progressed multilevel degenerative spondylosis with mild spinal canal narrowing at C4-C5.      No results found. However, due to the size of the patient record, not all encounters were searched. Please check Results Review for a complete set of results.

## 2024-07-25 NOTE — Unmapped (Signed)
-----   Message from Therisa Needy, FNP sent at 07/24/2024  4:43 PM EDT -----  Please schedule  for In person Return Neuroimmunology, 40 mins with Me    Date and time (please provide date and time ranges if possible): First available  Location: [place] or [place]  Diagnosis: Multiple sclerosis   Ok to use Urgent Slot? No  Ok to [employer]? No  Confirmation: Please confirm date and time with patient/family  Special Circumstances (any additional information to help scheduler): Patient needs appointment for further refills to be sent.    Thank you!  Therisa SHAUNNA Needy, FNP

## 2024-07-26 MED ORDER — AMLODIPINE 5 MG TABLET
ORAL_TABLET | ORAL | 11 refills | 0.00000 days | Status: CP
Start: 2024-07-26 — End: ?
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
## 2024-08-08 ENCOUNTER — Inpatient Hospital Stay: Admit: 2024-08-08 | Discharge: 2024-08-08 | Payer: Medicare (Managed Care)

## 2024-08-09 MED ORDER — PANTOPRAZOLE 40 MG TABLET,DELAYED RELEASE
ORAL_TABLET | Freq: Every day | ORAL | 2 refills | 100.00000 days | Status: CP
Start: 2024-08-09 — End: 2025-05-21

## 2024-08-09 NOTE — Unmapped (Signed)
 Pharmacy Requesting refill(s)

## 2024-08-10 MED ORDER — PANTOPRAZOLE 40 MG TABLET,DELAYED RELEASE
ORAL_TABLET | Freq: Every day | ORAL | 2 refills | 100.00000 days | Status: CP
Start: 2024-08-10 — End: 2025-05-22

## 2024-08-23 ENCOUNTER — Ambulatory Visit: Admit: 2024-08-23 | Discharge: 2024-08-23 | Payer: Medicare (Managed Care)

## 2024-08-23 DIAGNOSIS — G35A Relapsing remitting multiple sclerosis: Principal | ICD-10-CM

## 2024-08-23 DIAGNOSIS — R079 Chest pain, unspecified: Principal | ICD-10-CM

## 2024-08-23 DIAGNOSIS — Z5181 Encounter for therapeutic drug level monitoring: Principal | ICD-10-CM

## 2024-08-23 LAB — CBC W/ AUTO DIFF
BASOPHILS ABSOLUTE COUNT: 0.1 10*9/L (ref 0.0–0.1)
BASOPHILS RELATIVE PERCENT: 1.3 %
EOSINOPHILS ABSOLUTE COUNT: 0.1 10*9/L (ref 0.0–0.5)
EOSINOPHILS RELATIVE PERCENT: 2.3 %
HEMATOCRIT: 39.1 % (ref 34.0–44.0)
HEMOGLOBIN: 12.5 g/dL (ref 11.3–14.9)
LYMPHOCYTES ABSOLUTE COUNT: 1.9 10*9/L (ref 1.1–3.6)
LYMPHOCYTES RELATIVE PERCENT: 32.3 %
MEAN CORPUSCULAR HEMOGLOBIN CONC: 32 g/dL (ref 32.0–36.0)
MEAN CORPUSCULAR HEMOGLOBIN: 27.4 pg (ref 25.9–32.4)
MEAN CORPUSCULAR VOLUME: 85.8 fL (ref 77.6–95.7)
MEAN PLATELET VOLUME: 8.4 fL (ref 6.8–10.7)
MONOCYTES ABSOLUTE COUNT: 0.4 10*9/L (ref 0.3–0.8)
MONOCYTES RELATIVE PERCENT: 6.6 %
NEUTROPHILS ABSOLUTE COUNT: 3.4 10*9/L (ref 1.8–7.8)
NEUTROPHILS RELATIVE PERCENT: 57.5 %
PLATELET COUNT: 311 10*9/L (ref 150–450)
RED BLOOD CELL COUNT: 4.56 10*12/L (ref 3.95–5.13)
RED CELL DISTRIBUTION WIDTH: 14.3 % (ref 12.2–15.2)
WBC ADJUSTED: 5.8 10*9/L (ref 3.6–11.2)

## 2024-08-23 LAB — COMPREHENSIVE METABOLIC PANEL
ALBUMIN: 3.5 g/dL (ref 3.4–5.0)
ALKALINE PHOSPHATASE: 102 U/L (ref 46–116)
ALT (SGPT): 13 U/L (ref 10–49)
ANION GAP: 12 mmol/L (ref 5–14)
AST (SGOT): 18 U/L (ref <=34)
BILIRUBIN TOTAL: 0.3 mg/dL (ref 0.3–1.2)
BLOOD UREA NITROGEN: 13 mg/dL (ref 9–23)
BUN / CREAT RATIO: 10
CALCIUM: 9.3 mg/dL (ref 8.7–10.4)
CHLORIDE: 103 mmol/L (ref 98–107)
CO2: 28 mmol/L (ref 20.0–31.0)
CREATININE: 1.27 mg/dL — ABNORMAL HIGH (ref 0.55–1.02)
EGFR CKD-EPI (2021) FEMALE: 50 mL/min/1.73m2 — ABNORMAL LOW (ref >=60)
GLUCOSE RANDOM: 84 mg/dL (ref 70–179)
POTASSIUM: 4.4 mmol/L (ref 3.4–4.8)
PROTEIN TOTAL: 7.9 g/dL (ref 5.7–8.2)
SODIUM: 143 mmol/L (ref 135–145)

## 2024-08-23 NOTE — Progress Notes (Signed)
 [employer]  School of Medicine at [facility]  Multiple Sclerosis/Neuroimmunology Division  [facility]    Therisa Needy, [place]      DATE OF VISIT: 08/23/2024    Re:  Ted Sara Axel  [address] [place] [address]  MRN: [id number]  DOB: 1971-09-25    Visit: Follow up visit     REASON FOR VISIT: Ms. Vernesha Talbot, a 53 y.o. female  has a past medical history of Acid reflux, Allergic rhinitis, cause unspecified (04/11/2013), Depression, Dysthymic disorder (02/10/2012), Generalized anxiety disorder (02/10/2012), H/O bariatric surgery, High blood cholesterol level (05/31/2013), trichomonal vaginitis (02/2006), Hypertension, Insomnia (05/23/2013), Iron  deficiency anemia (05/24/2013), MS (multiple sclerosis) (11/23/2001), Obesity, Class III, BMI 40-49.9 (morbid obesity) (CMS-HCC), and Obstructive sleep apnea (11/01/2011). who presents today at the [facility], Multiple Sclerosis/Neuroimmunology Division for the follow up of relapsing remitting Multiple Sclerosis. The patient was last seen at the [facility], Multiple Sclerosis/Neuroimmunology Division on Visit date not found by .    Assessment:     1. I took a detailed history of the present illness from Ms. Jahzaria [employer] , details on past medical history, family history and social history.  2. I personally reviewed  patient's prior medical records, radiology reports, and laboratory work.    3. I personally reviewed the patient???s prior MRI images  and have discussed  MRI findings with the patient.   4.  I performed medication reconciliation and neurological examination.   5  Ms. Ted Sara Schrom completed the PHQ-9 depression questionnaire, SDMT, and 9-HPT.  6. The diagnostic and treatment plan were discussed with Ms. Ted Sara Brocks, who agrees with the discussed diagnostic and treatment plan. A Shared decision making approach has been used. ?? Multiple sclerosis,  relapsing remitting    - MS onset: 2003   In 2003 she had left sided optic neuritis. Right sided optic neuritis occurred after a month.     - MS diagnosis date: 2003    - Date of re-baseline MRI study for the current DMT: No re-baseline MRIs.  MRIs done 03/01/2018 (11 months after starting Aubagio )    - Last MRI review:  MRI brain without contrast (08/19/2024): Stable lesion burden   MRI cervical spine without contrast (08/19/2024): Stable lesion burden  MRI thoracic spine without contrast (08/19/2024): Focus of T2 hyperintensity at T3 slightly larger    - Disease course at onset: RRMS  - Current disease course: RRMS    - Last MS relapse date: March 2021 with sensory symptoms - balance was off and pain in her feet.  - Last steroid treatment: March 2021 - IVMP x 3 days    - MS DMD History:  Initially treated with Copaxone, discontinued in 2011.  Aubagio  started June 2018, on-going.    - MS symptomatic treatment:  Modafanil 200 mg daily for fatigue.  Oxybutynin  XL 5 mg daily for urinary urgency.  Tizanidine  4 mg nightly for leg spasms.    - Supplements: None.      Currently on Aubagio , with new persistent numbness in right 2nd-5th toes (possible mild sensory relapse) and gradual right hand weakness since last visit. MRI brain and cervical spine stable; thoracic spine with slight increase in T2 hyperintensity at T3. Clinical exam grossly stable. Plans to start using CPAP once she moves next week and can re-assess fatigue management at next visit.    Vitamin D  deficiency  On supplementation, managed by internal medicine.  Chest pain  Intermittent stabbing chest pain, non-radiating, no associated angina history. Episode during visit today. EKG in clinic normal.    Plan:     Medications:  - Continue Aubagio  14 mg daily.  - Continue modafanil 200 mg daily.  - Continue oxybutynin  XL 5 mg daily.  - Continue tizanidine  4 mg nightly.    Labs:  - Safety labs: CMP & CBC/diff.    Imaging/Tests:  - MRI brain, cervical, and thoracic spine without contrast due February 2026 at [facility].  - EKG ordered to perform in clinic during episode of chest pain.    Other Treatment:  - Advised consistent use of CPAP when sleeping.    Follow-up with me in 6 months.    Subjective:     HISTORY OF PRESENT ILLNESS  Ms. Soumya Colson, a 53 y.o. female  has a past medical history of Acid reflux, Allergic rhinitis, cause unspecified (04/11/2013), Depression, Dysthymic disorder (02/10/2012), Generalized anxiety disorder (02/10/2012), H/O bariatric surgery, High blood cholesterol level (05/31/2013), trichomonal vaginitis (02/2006), Hypertension, Insomnia (05/23/2013), Iron  deficiency anemia (05/24/2013), MS (multiple sclerosis) (11/23/2001), Obesity, Class III, BMI 40-49.9 (morbid obesity) (CMS-HCC), and Obstructive sleep apnea (11/01/2011). who presents today at the [facility] Neurology Clinic, Multiple Sclerosis/Neuroimmunology Division for follow up of there previously diagnosed relapsing remitting Multiple Sclerosis.    Flare History  In the following years after 2003: In the following years she had an episode when she could not feel cold/warm on one side of her body. She got better but not completely. She further had an episode of right sided weakness and received steroids and recovered.   Had flare up in 2015 with reccurrent optic neurtis with left eye.  Was evaluated in ER, treated with course of IV steroids at home.  Does not remember details.  Hosptilaized 03/23-03/26/2021 for sensory symptoms. Balance was off and she was having pain in her feet. Received three days of IV methylprednisolone     Interval History 01/30/24-08/23/24  Patient denies any significant worsening of symptoms.  Patient noticed approximately 6 months ago that the 2nd-5th digits of right toe are numb and sensation has not returned. May be mild sensory relapse.  She also notes that her right hand has slowly started to feel weaker since last office visit, it's been more difficult to open jars.   Otherwise symptoms remain stable.  Denies recurrent infections.  Patient started complaining of sudden stabbing chest pain during her appointment. She reports she has been having intermittent chest pain for awhile. She denied radiating pain through jaw, back, or arms. No known history of angina. Obtained EKG which read as normal sinus rhythm.    Current Symptoms (Chief Complaints) Yes No Notes   Blurry Vision []   [x]       Double Vision []   [x]       Speech problems []   [x]       Swallowing problems []   [x]       Fatigue [x]   []    Fatigue 10/10, interferes with ADLS >50%, also has sleep apnea but is not using CPAP, takes modafinil  200 mg in the morning    Weakness [x]   []    New right hand weakness developed slowly, used to have left hand weakness that resolved   Uhthoff phenomenon []   [x]       Balance/Coordination problems [x]   []    Intermittent balance problems, unchanged   Tingling/Numbness [x]   []    New numbness in 2nd-5th toes for approximately 6 months   Pain []   [  x]      Bladder control []   [x]    urinary urgency managed with oxybutynin     Bowel control []   [x]       Memory problems []   [x]       Mood problems []   [x]       Gait []   [x]    Unrestricted   Falls []   [x]       Headaches []   [x]      Seizures []   [x]      Spasticity []   [x]       Recurrent infections []   [x]       Other symptoms [x]   []   Intermittent left leg cramping/spasm        ............................................................................................................................................SABRA  DIAGNOSTIC STUDIES / REVIEW OF RECORDS:    Reviewed personally, please see assessment.     Labs Reviewed:     Results  LABS  CMP: Creatinine 1.04, AST/ALT 18/12 (01/30/2024)  Vitamin D : 27.1 (06/04/2024)      EKG (08/23/2024): Normal sinus rhythm (performed in clinic for chest pain)     .............................................................................................................................................      Past Medical History:  Past Medical History[1]    ALLERGIES:  Allergies[2]    CURRENT MEDICATIONS:  Current Medications[3]    Past Surgical History:  Past Surgical History[4]    Social History:  Social History[5]    Family History:  Family History[6]     Review of Systems:  A 10-systems review was performed and, unless otherwise noted, declared negative by patient.    Objective:       Physical Exam:  not currently breastfeeding.   General Appearance: well appearing and alert.   Normal skin color, afebrile.  Lungs: Eupneic, normal respiratory rate.   Circulatory: Mild non-pitting edema BLE, peripheral pulses palpable.   Abdomen: Soft, non-tender.       NEUROLOGICAL EXAMINATION:     General:    Alert and oriented to person, place, time and situation.    Recent and remote memory intact.    Attention span and concentration normal.    Language and spontaneous speech normal, no dysarthria or aphasia.  Fund of knowledge normal.       PHQ-9: 8      Cranial Nerves:     II, III: Visual Acuity: R 20/32, L 20/32 (with correction). No visual field defect. Pupils are equal, round, and reactive to light b/l (direct and consensual reactions).  Fundoscopic exam: No discrete temporal optic disc pallor bilaterally.     III, IV, VI: Extra ocular movements are intact in all directions. No nystagmus, ptosis, or diplopia.    V: Sensation intact over all three divisions bilaterally.    VII: Face symmetric at rest and with movement. No facial droop with smile/grimace. Forehead wrinkling and eye closure strong and symmetric.     VIII: Hearing grossly intact.    IX, X: Palate elevates symmetrically. Voice clear, no dysarthria or dysphonia.    XI: Sternocleidomastoid and trapezius strength 5/5 bilaterally.    XII: Tongue midline without atrophy or fasiculations. Full range of motion.      Motor Exam:     Normal bulk and tone throughout. No fasciculations or atrophy.     Muscle strength:     Muscles UEs Muscles LEs    Right Left  Right Left   Deltoids 5 5 Hip flexors 5 5   Biceps 5 5 Hip extensors 5 5   Triceps 5 5 Knee flexors 5 5   Hand grip 5 5 Knee extensors 5  5   Wrist flexors 5 5 Foot dorsal flexors 5 5   Wrist extensors 5 5 Foot plantar flexors 5 5   Finger flexors 5 5      Finger extensors 5 5        Mc Ardle's sign negative b/l      Reflexes Right Left   Biceps +3 +3   Brachioradialis +3 +3   Triceps +2 +2   Patella +3 +3   Achilles +2 +2     Suprapatellar reflex positive b/l  Crossed adductor reflex negative     Ankle clonus: 1-2 beats on right, none on left  Plantar response: Up-going b/l      Sensory System UEs  LEs     Right Left Right Left   Light touch WNL WNL WNL WNL   Temperature  WNL WNL Right lateral aspect of foot and calf WNL   Pinprick Deferred Deferred Deferred Deferred   Vibration WNL WNL Moderate decrease at toe Mild decrease at toe   Position Deferred Deferred WNL WNL       (WNL= within normal limits; UE= upper extremities; LE= lower extremities).      Cerebellar/Coordination:  Normal finger-to-nose with no end point dysmetria or dystaxia b/l.  Heel-to-shin demonstrate no abnormalities b/l.  Rapid alternating movements intact.  Romberg mildly positive.  Mild impairment with tandem gait.      Gait:   Normal stride, base and arm-swing.  Able to walk on toes, heels with difficulty.      Other tests/signs done today:    DATE EDSS T25FW SDMT 9-HPT  Right   Left   08/23/2024 3.0 6.63 sec, unassisted 29 (-2.0 SD < -1.5 SD) 28.03 sec 22.02 sec                                                                           EDSS = 3.0  Functional Scores  Visual 2  Brainstem 0  Pyramidal 0  Cerebellar 0  Sensory 2  Bowel/ Bladder 0  Cerebral 2  Ambulation score 1    .........................................................................................................................................SABRA    VISIT SUMMARY:  Ms. Dandra Shambaugh, a 53 y.o. female  presented with relapsing remitting Multiple Sclerosis.   Ms. Montasia Chisenhall Mort voiced a complete understanding of the diagnostic and treatment plan as detailed above. All questions were answered.     I personally spent 75 minutes face-to-face and non-face-to-face in the care of this patient, which includes all pre, intra, and post visit time on the date of service.  All documented time was specific to the E/M visit and does not include any procedures that may have been performed.    A verbal consent was obtained from the patient before using [employer] to write parts of this note.      Thank you for the opportunity to contribute to the care of Ms. Teliyah [employer].          [1]   Past Medical History:  Diagnosis Date    Acid reflux     Allergic rhinitis, cause unspecified 04/11/2013    Depression     Dysthymic disorder 02/10/2012    Generalized anxiety disorder 02/10/2012    H/O bariatric surgery  High blood cholesterol level 05/31/2013    Hx of trichomonal vaginitis 02/2006    On Pap smear    Hypertension     Insomnia 05/23/2013    Getting ambien through psych      Iron  deficiency anemia 05/24/2013    MS (multiple sclerosis) 11/23/2001    last flare up was 12/2019    Obesity, Class III, BMI 40-49.9 (morbid obesity) (CMS-HCC)     Obstructive sleep apnea 11/01/2011    uses cpap    [2]   Allergies  Allergen Reactions    Cymbalta [Duloxetine] Other (See Comments)     Metal taste in mouth      Keflex [Cephalexin] Other (See Comments)     rash    Kelp Itching   [3]   Current Outpatient Medications   Medication Sig Dispense Refill    acetaminophen  (TYLENOL ) 500 MG tablet Take 2 tablets (1,000 mg total) by mouth every eight (8) hours as needed for pain. 30 tablet 0    amlodipine  (NORVASC ) 5 MG tablet TAKE 1 TABLET BY MOUTH DAILY *EMERGENCY REFILL* 30 tablet 11    ascorbic acid, vitamin C , (ASCORBIC ACID) 250 mg Chew Chew.      ferrous sulfate  325 (65 FE) MG tablet Take 1 tablet (325 mg total) by mouth in the morning. 90 tablet 3    fluticasone  propionate (FLONASE ) 50 mcg/actuation nasal spray 2 sprays into each nostril daily. 16 g 6    modafinil  (PROVIGIL ) 100 MG tablet TAKE 2 TABLETS BY MOUTH EVERY DAY 120 tablet 2    olmesartan  (BENICAR ) 40 MG tablet Take 1 tablet (40 mg total) by mouth daily. 90 tablet 3    oxybutynin  (DITROPAN -XL) 5 MG 24 hr tablet TAKE 1 TABLET BY MOUTH DAILY 30 tablet 1    pantoprazole  (PROTONIX ) 40 MG tablet Take 1 tablet (40 mg total) by mouth in the morning. 100 tablet 2    PARoxetine  (PAXIL ) 30 MG tablet Take 2 tablets (60 mg total) by mouth every evening. 180 tablet 2    pregabalin  (LYRICA ) 75 MG capsule Take 1 capsule (75 mg total) by mouth nightly. 360 capsule 1    risedronate  (ACTONEL ) 5 MG tablet Take 1 tablet (5 mg total) by mouth daily before breakfast. with water on empty stomach, nothing by mouth or lie down for next 30 minutes. 90 tablet 3    teriflunomide  (AUBAGIO ) 14 mg Tab Take 1 tablet (14 mg total) by mouth every evening. 90 tablet 1     No current facility-administered medications for this visit.   [4]   Past Surgical History:  Procedure Laterality Date    CATARACT EXTRACTION Bilateral 08/14/2003, 09/09/2003    CESAREAN SECTION  01/12/2000    COSMETIC SURGERY      DEBRIDEMENT LEG Left 09/30/1999    Spider bite    ENDOMETRIAL ABLATION  02/11/2005    HYSTERECTOMY      OOPHORECTOMY      PR LAP, GAST RESTRICT PROC, LONGITUDINAL GASTRECTOMY Midline 02/15/2014    Procedure: ROBOTIC PR LAP, GAST RESTRICT PROC, LONGITUDINAL GASTRECTOMY;  Surgeon: Alm LELON Grieves, MD;  Location: MAIN OR [place];  Service: Gastrointestinal    PR UPPER GI ENDOSCOPY,DIAGNOSIS N/A 11/12/2020    Procedure: ESOPHAGOGASTRODUODENOSCOPY;  Surgeon: Morna Alyce Ronde, MD;  Location: ENDO PROCEDURES [place]; Service: Gastroenterology    TONSILLECTOMY  1982    TOTAL ABDOMINAL HYSTERECTOMY      TUBAL LIGATION  01/12/2000   [5]   Social History  Socioeconomic History  Marital status: Single     Spouse name: None    Number of children: 3    Years of education: None    Highest education level: None   Occupational History    Occupation: Personal Assistant: unknown   Tobacco Use    Smoking status: Never     Passive exposure: Never    Smokeless tobacco: Never   Vaping Use    Vaping status: Never Used   Substance and Sexual Activity    Alcohol use: No    Drug use: No    Sexual activity: Not Currently   Other Topics Concern    Do you use sunscreen? No    Tanning bed use? No    Are you easily burned? No    Excessive sun exposure? No   Social History Narrative    Single, has 3 children, aged 77-25. Married in 1997, divorced in 2001. Lives with her boyfriend. On disability (r/t Multiple Sclerosis), previously employed as a interior and spatial designer, bus hospital doctor. Born and raised in [place]. Attended [employer] in 1993.    Attended [employer] and [employer], some college credits attained.     Social Drivers of Psychologist, Prison And Probation Services Strain: High Risk (12/09/2023)    Overall Financial Resource Strain (CARDIA)     Difficulty of Paying Living Expenses: Very hard   Food Insecurity: Food Insecurity Present (12/09/2023)    Hunger Vital Sign     Worried About Running Out of Food in the Last Year: Often true     Ran Out of Food in the Last Year: Sometimes true   Transportation Needs: Unmet Transportation Needs (12/09/2023)    PRAPARE - Therapist, Art (Medical): Yes     Lack of Transportation (Non-Medical): Yes   Stress: Stress Concern Present (05/21/2024)    [employer] of Occupational Health - Occupational Stress Questionnaire     Feeling of Stress: Very much   Housing: High Risk (12/09/2023)    Housing     Within the past 12 months, have you ever stayed: outside, in a car, in a tent, in an overnight shelter, or temporarily in someone else's home (i.e. couch-surfing)?: Yes     Are you worried about losing your housing?: Patient refused   [6]   Family History  Problem Relation Age of Onset    Lupus Mother     Hypertension Mother     Fibromyalgia Mother     Heart disease Mother         aortic valve replacement    Cataracts Mother     Diabetes Mother     Brain cancer Father         Brain tumor    Hyperlipidemia Father     Leukemia Maternal Aunt     Lupus Maternal Aunt     Lupus Maternal Aunt     Breast cancer Cousin     Cervical cancer Cousin

## 2024-08-23 NOTE — Patient Instructions (Addendum)
 Thank you for visiting the [facility] MS/Neuroimmunology [facility] today.   This is a specialty clinic and there is a need for you to have a primary care provider who will take care of your non-neurological health.   ...............................................................    Our recommendations from today's visit:     Continue Aubagio  14 mg daily.  Lab draw today: CMP & CBC with differential.  Annual MRIs of brain, cervical spine, and thoracic spine have been ordered and are due in February 2026. The scheduler will call you to set up this appointment.  If you do not hear from them, call [phone].  Continue modafanil 200 mg daily for fatigue.  Continue Oxybutynin  XL 5 mg once a day as needed.  I will discuss continuing tizanidine  4 mg nightly with Dr. Dujmovic and I will be in touch regarding your prescription.  Follow-up with me in 6 months.  ..............................................................SABRA    It was a pleasure to see you today!  We are grateful for the opportunity to contribute to your care and are looking forward to seeing you again.    Therisa Nakai) Blanche, DNP, APRN, FNP-C  Nurse Practitioner  Multiple Sclerosis/Neuroimmunology Division  [facility]  .............................................................    Please contact our office at [phone] or seek medical attention for the following:    New neurological symptoms or significant worsening of existing symptoms that last for more than 24 hours without any signs of infection (like a fever or flu) or other external factors (like heat exposure or stress).    Common symptoms that may indicate a flare include:    New or worsened numbness, tingling, or weakness in an arm or leg.  Vision changes (blurring, double vision, or loss of vision) or eye pain.  Significant loss of balance, coordination problems, or dizziness.  Severe fatigue or difficulty walking that significantly impacts your daily activities.  Bladder problems (difficulty or urgency using the bathroom).  Memory or concentration issues.     .............................................................    Please find our contact information below.      [facility] Front Desk  Phone: [phone]     If you need assistance from our social worker, please contact:   Rona Waylan HUGHS, phone: [phone]    If you need to speak with the pharmacist, please contact:  Corean Bailer, PharmD,CPP; phone: [phone]    If you would have questions outside regular office hours, please call [facility] hospital operator:  Phone: [phone], and ask for a neurology resident on-call.

## 2024-08-24 MED ORDER — TIZANIDINE 4 MG TABLET
ORAL_TABLET | Freq: Every evening | ORAL | 1 refills | 90.00000 days | Status: CP | PRN
Start: 2024-08-24 — End: ?

## 2024-08-28 DIAGNOSIS — J329 Chronic sinusitis, unspecified: Principal | ICD-10-CM

## 2024-08-28 MED ORDER — FLUTICASONE PROPIONATE 50 MCG/ACTUATION NASAL SPRAY,SUSPENSION
Freq: Every day | NASAL | 8 refills | 60.00000 days | Status: CP
Start: 2024-08-28 — End: 2025-05-21

## 2024-08-28 NOTE — Telephone Encounter (Signed)
 Pharmacy requesting refills.

## 2024-08-30 NOTE — Progress Notes (Signed)
 Complex Family Planning Note    Assessment & Plan:   #labial cyst  -small labial cyst, now ~22mm, resolving, asymptomatic  -possible inclusion cyst vs ingrown hair. Low concern for malignancy  -recommended warm compresses  -if not improving after 4w, recommend return to clinic    Subjective:   Victoria Kelly is a 53 y.o. [phone] (postmenopausal) here for labial cyst. Denies F/C/N/V/SOB/CP    She noted a bump on her left labia earlier last week, about 1-2cm. It is not painful, pruritic, and has no discharge or pus. Since onset, it has now shrunken. She does not shave. She uses plain soap to clean labia, no changes in detergents. No recent intercourse. She has never had this lesion before but would like it examined to determine if it is concerning.     Past Medical History:  Past Medical History[1]    Past Surgical History:  Past Surgical History[2]    Medications:   Current Outpatient Medications   Medication Instructions    acetaminophen  (TYLENOL ) 1,000 mg, Oral, Every 8 hours PRN    amlodipine  (NORVASC ) 5 MG tablet TAKE 1 TABLET BY MOUTH DAILY *EMERGENCY REFILL*    ascorbic acid, vitamin C , (ASCORBIC ACID) 250 mg Chew Chew.    ferrous sulfate  325 mg, Oral, Daily    fluticasone  propionate (FLONASE ) 50 mcg/actuation nasal spray 2 sprays, Each Nare, Daily (standard)    modafinil  (PROVIGIL ) 200 mg, Oral, Daily (standard)    olmesartan  (BENICAR ) 40 mg, Oral, Daily (standard)    oxybutynin  (DITROPAN -XL) 5 mg, Oral, Daily (standard)    pantoprazole  (PROTONIX ) 40 mg, Oral, Daily    PARoxetine  (PAXIL ) 60 mg, Oral, Every evening    pregabalin  (LYRICA ) 75 mg, Oral, Nightly    risedronate  (ACTONEL ) 5 mg, Oral, Daily before breakfast, with water on empty stomach, nothing by mouth or lie down for next 30 minutes.    teriflunomide  (AUBAGIO ) 14 mg, Oral, Every evening    tizanidine  (ZANAFLEX ) 4 mg, Oral, Nightly PRN       Allergies:  Allergies[3]    Social History:   Tobacco: Negative  EtOH: Negative  Drug use: Negative    Obstetric History:   OB History   Gravida Para Term Preterm AB Living   3 3 3   3    SAB IAB Ectopic Molar Multiple Live Births        3      # Outcome Date GA Lbr Len/2nd Weight Sex Type Anes PTL Lv   3 Term      CS-LTranv   Victoria   2 Term      Vag-Spont   Victoria   1 Term      Vag-Spont   Victoria        Gynecologic History:   H/o hysterectomy for AUB in 30s. S/p BTL. Denies fibroids, ov cysts, abnml paps, STIs    Objective:   Vitals:    08/30/24 1350   BP: 174/87   Pulse: 57     Gen: well-appearing, well-developed, well-nourished in NAD  HEENT: oropharynx clear  CV: RRR, S1, S2  Pulm: Clear to auscultation bilaterally  Breast: Deferred  Abd: Soft, nontender, nondistended, no rebound/guarding  GU: External genitalia normal appearing without erythema, edema, or induration. Patient pointed to lesion. Lesion was just left-sided lateral to posterior fourchette (5ock), which was a small ~6mm palpable cystic subdermal lesion, nontender to palpation, nonerythematous, no fluctuance. No overlying skin changes.  Ext: no calf tenderness, no edema, normal ROM  Airway: N/A    Prior to the outpatient exam, consent was obtained from the patient prior to the sensitive portion of the exam. Exam chaperoned by Truman Antoine Juliene JONETTA Raguel, MD PGY6       [1]   Past Medical History:  Diagnosis Date    Acid reflux     Allergic rhinitis, cause unspecified 04/11/2013    Depression     Dysthymic disorder 02/10/2012    Generalized anxiety disorder 02/10/2012    H/O bariatric surgery     High blood cholesterol level 05/31/2013    Hx of trichomonal vaginitis 02/2006    On Pap smear    Hypertension     Insomnia 05/23/2013    Getting ambien through psych      Iron  deficiency anemia 05/24/2013    MS (multiple sclerosis) 11/23/2001    last flare up was 12/2019    Obesity, Class III, BMI 40-49.9 (morbid obesity) (CMS-HCC)     Obstructive sleep apnea 11/01/2011    uses cpap    [2]   Past Surgical History:  Procedure Laterality Date CATARACT EXTRACTION Bilateral 08/14/2003, 09/09/2003    CESAREAN SECTION  01/12/2000    COSMETIC SURGERY      DEBRIDEMENT LEG Left 09/30/1999    Spider bite    ENDOMETRIAL ABLATION  02/11/2005    HYSTERECTOMY      OOPHORECTOMY      PR LAP, GAST RESTRICT PROC, LONGITUDINAL GASTRECTOMY Midline 02/15/2014    Procedure: ROBOTIC PR LAP, GAST RESTRICT PROC, LONGITUDINAL GASTRECTOMY;  Surgeon: Alm LELON Grieves, MD;  Location: MAIN OR [place];  Service: Gastrointestinal    PR UPPER GI ENDOSCOPY,DIAGNOSIS N/A 11/12/2020    Procedure: ESOPHAGOGASTRODUODENOSCOPY;  Surgeon: Morna Alyce Ronde, MD;  Location: ENDO PROCEDURES [place] [facility];  Service: Gastroenterology    TONSILLECTOMY  1982    TOTAL ABDOMINAL HYSTERECTOMY      TUBAL LIGATION  01/12/2000   [3]   Allergies  Allergen Reactions    Cymbalta [Duloxetine] Other (See Comments)     Metal taste in mouth      Keflex [Cephalexin] Other (See Comments)     rash    Kelp Itching

## 2024-09-19 ENCOUNTER — Inpatient Hospital Stay: Admit: 2024-09-19 | Discharge: 2024-09-19 | Payer: Medicare (Managed Care)

## 2024-09-19 MED ADMIN — gadopiclenol (ELUCIREM,VUEWAY) injection 10 mL: 10 mL | INTRAVENOUS | @ 20:00:00 | Stop: 2024-09-19

## 2024-09-20 DIAGNOSIS — N3281 Overactive bladder: Principal | ICD-10-CM

## 2024-09-20 DIAGNOSIS — G35D Multiple sclerosis: Principal | ICD-10-CM

## 2024-09-20 MED ORDER — OXYBUTYNIN CHLORIDE ER 5 MG TABLET,EXTENDED RELEASE 24 HR
ORAL_TABLET | Freq: Every day | ORAL | 11 refills | 0.00000 days
Start: 2024-09-20 — End: ?

## 2024-09-21 MED ORDER — OXYBUTYNIN CHLORIDE ER 5 MG TABLET,EXTENDED RELEASE 24 HR
ORAL_TABLET | Freq: Every day | ORAL | 5 refills | 30.00000 days | Status: CP
Start: 2024-09-21 — End: ?

## 2024-09-21 NOTE — Telephone Encounter (Signed)
 Last Visit Date: 08/23/2024  Next Visit Date: 02/20/2025    No results found for: CBC, CMP     Results for orders placed or performed in visit on 08/23/24   ECG 12 lead    Collection Time: 08/23/24  5:15 PM   Result Value Ref Range    EKG Systolic BP  mmHg    EKG Diastolic BP  mmHg    EKG Ventricular Rate 63 BPM    EKG Atrial Rate 63 BPM    EKG P-R Interval 130 ms    EKG QRS Duration 84 ms    EKG Q-T Interval 420 ms    EKG QTC Calculation 429 ms    EKG Calculated P Axis 57 degrees    EKG Calculated R Axis -22 degrees    EKG Calculated T Axis 36 degrees    QTC Fredericia 427 ms

## 2024-09-23 NOTE — Progress Notes (Signed)
 Internal Medicine Video Visit    This visit is conducted via video conferencing.    Contact Information  Person Contacted: Patient  Contact Phone number: There are no phone numbers on file.  Is there someone else in the room? No.   Patient agreed to a video visit    Victoria Kelly is a 53 y.o. female  participating in a video visit.    Reason for visit:  Follow up    Subjective:  [name], [name], [name], [name], Hyperparathyroidism, HTN, Osteoporosis, [name], [name], and history of gastric sleeve presents today for routine follow up. Former patient of [employer].    Last seen by me 05/21/24 -- refilled chronic medicines, discussed MS, venous stasis changes, HTN, [name], [name], and headaches    History of Present Illness  She has been experiencing persistent numbness in her right foot and toes, which she associates with her multiple sclerosis. This numbness feels similar to previous flare-ups, although she has not had a flare-up in a while. Her neurologist recommended an MRI of her brain, spine, and back to assess her condition.    She is currently taking Aubagio  daily for her multiple sclerosis. She also takes tizanidine  4 mg at night as a muscle relaxer, initially prescribed for headaches and to aid sleep. She experiences swelling in her legs and feet, which she manages by propping her legs up and wearing compression stockings at home.    Her medical history includes hypertension, for which she takes amlodipine  5 mg daily. She also takes Paxil  in the evening for depression and Lyrica  75 mg at night, though not daily. Modafinil  is used for energy as prescribed by her previous neurologist. She has a history of a compression fracture in her lumbar spine from a fall in the shower and has been diagnosed with hyperparathyroidism due to vitamin D  deficiency.    She has a family history of diabetes, with her mother being a 'low sugar diabetic'. Her mother-in-law passed away from fluid around the heart at age 13.    She is a part-time PCA and a plasma donor. She was informed that her alpha-1 protein levels were high, which prevented her from donating recently. She has been struggling with weight management and has not been tested for diabetes, although she has been referred to an endocrinologist in the past.        I have reviewed the problem list, medications, and allergies and have updated/reconciled them if needed.    Objective:  As part of this Video Visit, no in-person exam was conducted.  Video interaction permitted the following observations.    GEN: No acute distress.   SKIN: Color is normal. No rashes.  PSYCH: Appropriate affect, normal mood  RESP: Normal work of breathing, no retractions         PTHomeBP    The patient???s Average Home Blood Pressure during the last two weeks is :   /   based on  readings              PHQ-9 Score:     [name]-7 Score:         Assessment & Plan:  1. Leg swelling    2. Murmur    3. Essential hypertension    4. Depression, unspecified depression type    5. Stage 3a chronic kidney disease (CMS-HCC)      Assessment & Plan  Lower extremity edema and chronic venous stasis  Persistent edema in legs  and toes. Compression stockings used. Previous ultrasound negative for DVT in 2024. Possible amlodipine  contribution but swelling started prior. Cardiac causes considered due to heart murmur and family history.  - Ordered echocardiogram to evaluate cardiac function and rule out cardiac causes of edema.    Multiple sclerosis with recent flare and associated symptoms  Recent MS flare with balance issues and numbness in right toes. Currently on Aubagio . Follows with [facility]  - MRI brain, c spine, and t spine pending    Chronic Kidney Disease  Stable at last recheck  - repeat renal function panel in 3 months    Essential Hypertension   Variable blood pressure with recent elevation to 174/87 mmHg. Suspect this is a one off given stable priors.    - Continue current antihypertensive regimen with amlodipine  5 mg and olmesartan  40 mg    Depression  Well controlled on Paxil  60 mg daily.  - Continue Paxil  60 mg daily.    HCM  - received first dose of shingles, will get second dose in coming months       Return in about 3 months (around 12/23/2024) for In-person.       Staffed with Dr. Dasie, discussed    Victoria Stanton, MD   [facility] Internal Medicine, PGY-3      The patient reports they are physically located in [place]  and is currently: at home. I conducted a audio/video visit. I spent  74m 54s on the video call with the patient. I spent an additional 10 minutes on pre- and post-visit activities on the date of service .

## 2024-09-24 DIAGNOSIS — M7989 Other specified soft tissue disorders: Principal | ICD-10-CM

## 2024-09-24 DIAGNOSIS — N1831 Stage 3a chronic kidney disease (CMS-HCC): Principal | ICD-10-CM

## 2024-09-24 DIAGNOSIS — I10 Essential (primary) hypertension: Principal | ICD-10-CM

## 2024-09-24 DIAGNOSIS — R011 Cardiac murmur, unspecified: Principal | ICD-10-CM

## 2024-09-24 DIAGNOSIS — F32A Depression, unspecified depression type: Principal | ICD-10-CM

## 2024-09-25 NOTE — Telephone Encounter (Signed)
 Called patient to schedule Return in about 3 months (around 12/23/2024) for In-person. With PCP left voicemail/callback number

## 2024-10-04 ENCOUNTER — Inpatient Hospital Stay: Admit: 2024-10-04 | Discharge: 2024-10-04 | Payer: Medicare (Managed Care)

## 2024-10-07 NOTE — Progress Notes (Signed)
This was a telehealth service performed by a resident and I was available to the resident in person. Immediately after or during the visit, I reviewed with the resident the medical history and the resident???s assessment and plan. I discussed with the resident the patient???s diagnosis and concur with the treatment plan as documented in the resident note.    Shirlean Kelly, MD

## 2024-10-07 NOTE — Addendum Note (Signed)
 Addended by: Naliya Gish on: 10/07/2024 09:41 PM     Modules accepted: Level of Service

## 2024-10-09 DIAGNOSIS — J329 Chronic sinusitis, unspecified: Principal | ICD-10-CM

## 2024-10-09 NOTE — Telephone Encounter (Signed)
 Medication refill request fluticasone  propionate (FLONASE ) 50 mcg/actuation nasal spray. Refills in system

## 2024-10-16 NOTE — Progress Notes (Signed)
 Specialty Medication(s): teriflunomide     Victoria Kelly has been dis-enrolled from the [facility] specialty pharmacy services as a result of multiple unsuccessful outreach attempts by the pharmacy.    Additional information provided to the patient: n/a    [name], PharmD  [facility] Specialty Pharmacist

## 2024-10-29 MED ORDER — MODAFINIL 100 MG TABLET
ORAL_TABLET | Freq: Every day | ORAL | 2 refills | 60.00000 days | Status: CP
Start: 2024-10-29 — End: ?

## 2024-10-30 ENCOUNTER — Emergency Department
Admit: 2024-10-30 | Discharge: 2024-10-30 | Disposition: A | Discharge status: Home / Self Care | Payer: PRIVATE HEALTH INSURANCE

## 2024-10-30 ENCOUNTER — Emergency Department
Admit: 2024-10-30 | Discharge: 2024-10-30 | Disposition: A | Discharge status: Home / Self Care | Payer: Medicare (Managed Care)

## 2024-10-30 DIAGNOSIS — R918 Other nonspecific abnormal finding of lung field: Principal | ICD-10-CM

## 2024-10-30 DIAGNOSIS — M810 Age-related osteoporosis without current pathological fracture: Principal | ICD-10-CM

## 2024-10-30 DIAGNOSIS — J189 Pneumonia, unspecified organism: Principal | ICD-10-CM

## 2024-10-30 DIAGNOSIS — J111 Influenza due to unidentified influenza virus with other respiratory manifestations: Principal | ICD-10-CM

## 2024-10-30 MED ORDER — AMOXICILLIN 875 MG-POTASSIUM CLAVULANATE 125 MG TABLET
ORAL_TABLET | Freq: Two times a day (BID) | ORAL | 0 refills | 10.00000 days | Status: CP
Start: 2024-10-30 — End: 2024-11-09

## 2024-10-30 MED ORDER — AZITHROMYCIN 250 MG TABLET
ORAL_TABLET | Freq: Every day | ORAL | 0 refills | 6.00000 days | Status: CP
Start: 2024-10-30 — End: ?

## 2024-10-30 MED ORDER — RISEDRONATE 5 MG TABLET
ORAL_TABLET | ORAL | 3 refills | 90.00000 days | Status: CP
Start: 2024-10-30 — End: 2025-10-30

## 2024-11-08 DIAGNOSIS — M810 Age-related osteoporosis without current pathological fracture: Principal | ICD-10-CM

## 2024-11-08 DIAGNOSIS — J329 Chronic sinusitis, unspecified: Principal | ICD-10-CM

## 2024-11-08 MED ORDER — FLUTICASONE PROPIONATE 50 MCG/ACTUATION NASAL SPRAY,SUSPENSION
Freq: Every day | NASAL | 8 refills | 60.00000 days
Start: 2024-11-08 — End: 2025-08-01

## 2024-11-08 MED ORDER — RISEDRONATE 5 MG TABLET
ORAL_TABLET | ORAL | 3 refills | 90.00000 days
Start: 2024-11-08 — End: 2025-11-08
